# Patient Record
Sex: Male | Born: 1975 | Race: White | Hispanic: No | Marital: Married | Smoking: Never smoker
Health system: Southern US, Community
[De-identification: ages and names within clinical notes are randomized; demographics above are authoritative.]

## PROBLEM LIST (undated history)

## (undated) DIAGNOSIS — F4310 Post-traumatic stress disorder, unspecified: Secondary | ICD-10-CM

## (undated) DIAGNOSIS — D649 Anemia, unspecified: Secondary | ICD-10-CM

## (undated) DIAGNOSIS — F250 Schizoaffective disorder, bipolar type: Secondary | ICD-10-CM

## (undated) DIAGNOSIS — Z8659 Personal history of other mental and behavioral disorders: Secondary | ICD-10-CM

## (undated) DIAGNOSIS — F515 Nightmare disorder: Secondary | ICD-10-CM

## (undated) DIAGNOSIS — X749XXA Intentional self-harm by unspecified firearm discharge, initial encounter: Secondary | ICD-10-CM

## (undated) DIAGNOSIS — M1990 Unspecified osteoarthritis, unspecified site: Secondary | ICD-10-CM

## (undated) DIAGNOSIS — X838XXA Intentional self-harm by other specified means, initial encounter: Secondary | ICD-10-CM

## (undated) DIAGNOSIS — F4312 Post-traumatic stress disorder, chronic: Secondary | ICD-10-CM

## (undated) DIAGNOSIS — E785 Hyperlipidemia, unspecified: Secondary | ICD-10-CM

## (undated) HISTORY — PX: SPINE SURGERY: SHX786

## (undated) HISTORY — DX: Anemia, unspecified: D64.9

## (undated) HISTORY — PX: INGUINAL HERNIA REPAIR: SUR1180

## (undated) HISTORY — DX: Unspecified osteoarthritis, unspecified site: M19.90

## (undated) HISTORY — PX: OTHER SURGICAL HISTORY: SHX169

## (undated) HISTORY — DX: Hyperlipidemia, unspecified: E78.5

---

## 2019-11-07 ENCOUNTER — Emergency Department (HOSPITAL_COMMUNITY)
Admission: EM | Admit: 2019-11-07 | Discharge: 2019-11-07 | Disposition: A | Discharge status: Home / Self Care | Payer: 59 | Attending: Emergency Medicine | Admitting: Emergency Medicine

## 2019-11-07 ENCOUNTER — Other Ambulatory Visit: Payer: Self-pay

## 2019-11-07 ENCOUNTER — Encounter (HOSPITAL_COMMUNITY): Payer: Self-pay | Admitting: Emergency Medicine

## 2019-11-07 DIAGNOSIS — R1031 Right lower quadrant pain: Secondary | ICD-10-CM

## 2019-11-07 DIAGNOSIS — R1032 Left lower quadrant pain: Secondary | ICD-10-CM | POA: Diagnosis present

## 2019-11-07 HISTORY — DX: Hyperlipidemia, unspecified: E78.5

## 2019-11-07 HISTORY — DX: Unspecified osteoarthritis, unspecified site: M19.90

## 2019-11-07 MED ORDER — IBUPROFEN 400 MG PO TABS
600.0000 mg | ORAL_TABLET | Freq: Once | ORAL | Status: AC
  Administered 2019-11-07: 600 mg via ORAL
  Filled 2019-11-07: qty 2

## 2019-11-07 NOTE — ED Triage Notes (Addendum)
Pt was recently diagnosed with a right side inguinal hernia by his PCP. Pt was scheduled for a CT and ultrasound on Monday, but the pt is here for pain control. Pt was given 10 mg of morphine in route to bring pain down from an 8 to a 5.

## 2019-11-07 NOTE — Discharge Instructions (Addendum)
It was our pleasure to provide your ER care today - we hope that you feel better.  Take ibuprofen or acetaminophen as need for pain.   Follow up with your doctor this week as planned.   If increased swelling/hernia to area, follow up with general surgeon in the next few weeks.   Return to ER if worse, persistent painful bulge (incarcerated hernia) to area, fevers, abdominal pain, persistent vomiting, or other concern.

## 2019-11-07 NOTE — ED Provider Notes (Signed)
[facility] EMERGENCY DEPARTMENT Provider Note   CSN: [id number] Arrival date & time: 11/07/19  1244     History Chief Complaint  Patient presents with  . Inguinal Hernia    Evan Wilson is a 44 y.o. male.  Patient indicates in past couple weeks he's had an intermittently pain to right groin area, that feels as if something is being stretched. Pain localized to an area the size of a quarter, non radiating, dull, moderate, worse w certain movements (I.e. bearing down, certain movements/postions of right lower ext). No swelling to leg. No abd pain or nausea/vomiting. No fever or chills. No swelling or redness to area. No scrotal or testicular pain. No gu c/o.   The history is provided by the patient.       Past Medical History:  Diagnosis Date  . Arthritis    spine  . Hyperlipidemia     There are no problems to display for this patient.   Past Surgical History:  Procedure Laterality Date  . broken fingers    . Broken thumb         History reviewed. No pertinent family history.  Social History   Tobacco Use  . Smoking status: Never Smoker  . Smokeless tobacco: Never Used  Vaping Use  . Vaping Use: Never used  Substance Use Topics  . Alcohol use: Yes    Alcohol/week: 10.0 standard drinks    Types: 10 Cans of beer per week  . Drug use: Never    Home Medications Prior to Admission medications   Not on File    Allergies    Patient has no allergy information on record.  Review of Systems   Review of Systems  Constitutional: Negative for chills and fever.  Gastrointestinal: Negative for abdominal pain, nausea and vomiting.  Genitourinary: Negative for dysuria and hematuria.  Musculoskeletal: Negative for back pain.  Skin: Negative for rash and wound.    Physical Exam Updated Vital Signs BP 104/67 (BP Location: Right Arm)   Pulse 72   Temp 98 F (36.7 C) (Oral)   Resp 18   Ht 1.803 m (5\' 11" )   Wt 111.1 kg   SpO2 93%   BMI 34.17 kg/m    Physical Exam Vitals and nursing note reviewed.  Constitutional:      Appearance: Normal appearance. He is well-developed.  HENT:     Head: Atraumatic.     Nose: Nose normal.     Mouth/Throat:     Mouth: Mucous membranes are moist.  Eyes:     General: No scleral icterus.    Conjunctiva/sclera: Conjunctivae normal.  Neck:     Trachea: No tracheal deviation.  Cardiovascular:     Rate and Rhythm: Normal rate.     Pulses: Normal pulses.  Pulmonary:     Effort: Pulmonary effort is normal. No accessory muscle usage or respiratory distress.  Abdominal:     General: Bowel sounds are normal. There is no distension.     Palpations: Abdomen is soft. There is no mass.     Tenderness: There is no abdominal tenderness. There is no guarding.     Comments: Mild tenderness at right inguinal ring area. No incarcerated hernia. No mass. No l/a. No skin changes or soft tissue swelling.   Genitourinary:    Comments: No cva tenderness. No scrotal or testicular pain, swelling, or tenderness. Normal external gu exam.  Musculoskeletal:        General: No swelling.     Cervical  back: Neck supple.     Right lower leg: No edema.     Left lower leg: No edema.     Comments: RLE, femoral and distal pulses palp.   Skin:    General: Skin is warm and dry.     Findings: No rash.  Neurological:     Mental Status: He is alert.     Comments: Alert, speech clear.   Psychiatric:        Mood and Affect: Mood normal.     ED Results / Procedures / Treatments   Labs (all labs ordered are listed, but only abnormal results are displayed) Labs Reviewed - No data to display  EKG None  Radiology No results found.  Procedures Procedures (including critical care time)  Medications Ordered in ED Medications  ibuprofen (ADVIL) tablet 600 mg (has no administration in time range)    ED Course  I have reviewed the triage vital signs and the nursing notes.  Pertinent labs & imaging results that were  available during my care of the patient were reviewed by me and considered in my medical decision making (see chart for details).    MDM Rules/Calculators/A&P                          Ibuprofen po.  Reviewed nursing notes and prior charts for additional history.   ?possible right inguinal ring strain/pain.  Ptx exam is otherwise unremarkable. Pt does indicate that he has outpatient imaging and further evaluation already arranged by pcp.  Return precautions provided.     Final Clinical Impression(s) / ED Diagnoses Final diagnoses:  None    Rx / DC Orders ED Discharge Orders    None       Cathren Laine, MD 11/07/19 1313

## 2019-11-10 ENCOUNTER — Ambulatory Visit (INDEPENDENT_AMBULATORY_CARE_PROVIDER_SITE_OTHER): Payer: 59 | Admitting: General Surgery

## 2019-11-10 ENCOUNTER — Encounter: Payer: Self-pay | Admitting: General Surgery

## 2019-11-10 ENCOUNTER — Other Ambulatory Visit: Payer: Self-pay

## 2019-11-10 ENCOUNTER — Other Ambulatory Visit: Payer: Self-pay | Admitting: Family Medicine

## 2019-11-10 VITALS — BP 144/95 | HR 90 | Temp 98.0°F | Resp 16 | Ht 71.0 in | Wt 244.0 lb

## 2019-11-10 DIAGNOSIS — K4090 Unilateral inguinal hernia, without obstruction or gangrene, not specified as recurrent: Secondary | ICD-10-CM

## 2019-11-10 NOTE — Patient Instructions (Signed)

## 2019-11-10 NOTE — H&P (Signed)
Evan Wilson; [id number]; 1975/05/16   HPI Patient is a 44 year old white male who was referred to my care by Dr. Salvadore Farber for evaluation and treatment of a right inguinal hernia.  He states he started having right groin pain several weeks ago.  He thinks he was lifting something heavy.  Since that time, he has had ongoing intermittent right groin pain which radiates to his right inner thigh.  He was seen in the emergency room at [facility] and was thought to have a right inguinal hernia.  He had an outpatient ultrasound done which confirmed a right inguinal hernia.  He currently has 3 out of 10 pain with the hernia.  It is made worse with straining.   Past Medical History:  Diagnosis Date  . Arthritis    spine  . Hyperlipidemia     Past Surgical History:  Procedure Laterality Date  . broken fingers    . Broken thumb      History reviewed. No pertinent family history.  Current Outpatient Medications on File Prior to Visit  Medication Sig Dispense Refill  . atorvastatin (LIPITOR) 20 MG tablet Take 1 tablet by mouth daily.    Marland Kitchen tiZANidine (ZANAFLEX) 4 MG tablet Take 1 tablet by mouth every 8 (eight) hours as needed.    . traMADol (ULTRAM) 50 MG tablet Take by mouth.     No current facility-administered medications on file prior to visit.    Allergies  Allergen Reactions  . Ketorolac Tromethamine Swelling    Causes leg swelling    Social History   Substance and Sexual Activity  Alcohol Use Yes  . Alcohol/week: 10.0 standard drinks  . Types: 10 Cans of beer per week    Social History   Tobacco Use  Smoking Status Never Smoker  Smokeless Tobacco Never Used    Review of Systems  Constitutional: Negative.   HENT: Negative.   Eyes: Negative.   Respiratory: Negative.   Cardiovascular: Negative.   Gastrointestinal: Negative.   Genitourinary: Negative.   Musculoskeletal: Negative.   Skin: Negative.   Neurological: Negative.   Endo/Heme/Allergies: Negative.    Psychiatric/Behavioral: Negative.     Objective   Vitals:   11/10/19 1250  BP: (!) 144/95  Pulse: 90  Resp: 16  Temp: 98 F (36.7 C)  SpO2: 98%    Physical Exam Vitals reviewed.  Constitutional:      Appearance: Normal appearance. He is not ill-appearing.  HENT:     Head: Normocephalic and atraumatic.  Cardiovascular:     Rate and Rhythm: Normal rate and regular rhythm.     Heart sounds: Normal heart sounds. No murmur heard.  No friction rub. No gallop.   Pulmonary:     Effort: Pulmonary effort is normal. No respiratory distress.     Breath sounds: Normal breath sounds. No stridor. No wheezing, rhonchi or rales.  Abdominal:     General: Bowel sounds are normal. There is no distension.     Palpations: Abdomen is soft. There is no mass.     Tenderness: There is no abdominal tenderness. There is no guarding or rebound.     Hernia: A hernia is present.     Comments: Reducible right inguinal hernia.  Genitourinary:    Comments: Genitourinary examination is within normal limits. Skin:    General: Skin is warm and dry.  Neurological:     Mental Status: He is alert and oriented to person, place, and time.    Primary care and ER notes reviewed  Assessment  Right inguinal hernia Plan   Patient is scheduled for right inguinal herniorrhaphy with mesh on 11/16/2019.  The risks and benefits of the procedure including bleeding, infection, mesh use, and the possibility of recurrence of the hernia were fully explained to the patient, who gave informed consent. 

## 2019-11-10 NOTE — Progress Notes (Signed)
Evan Wilson; [id number]; 1975/05/16   HPI Patient is a 44 year old white male who was referred to my care by Dr. Salvadore Farber for evaluation and treatment of a right inguinal hernia.  He states he started having right groin pain several weeks ago.  He thinks he was lifting something heavy.  Since that time, he has had ongoing intermittent right groin pain which radiates to his right inner thigh.  He was seen in the emergency room at [facility] and was thought to have a right inguinal hernia.  He had an outpatient ultrasound done which confirmed a right inguinal hernia.  He currently has 3 out of 10 pain with the hernia.  It is made worse with straining.   Past Medical History:  Diagnosis Date  . Arthritis    spine  . Hyperlipidemia     Past Surgical History:  Procedure Laterality Date  . broken fingers    . Broken thumb      History reviewed. No pertinent family history.  Current Outpatient Medications on File Prior to Visit  Medication Sig Dispense Refill  . atorvastatin (LIPITOR) 20 MG tablet Take 1 tablet by mouth daily.    Marland Kitchen tiZANidine (ZANAFLEX) 4 MG tablet Take 1 tablet by mouth every 8 (eight) hours as needed.    . traMADol (ULTRAM) 50 MG tablet Take by mouth.     No current facility-administered medications on file prior to visit.    Allergies  Allergen Reactions  . Ketorolac Tromethamine Swelling    Causes leg swelling    Social History   Substance and Sexual Activity  Alcohol Use Yes  . Alcohol/week: 10.0 standard drinks  . Types: 10 Cans of beer per week    Social History   Tobacco Use  Smoking Status Never Smoker  Smokeless Tobacco Never Used    Review of Systems  Constitutional: Negative.   HENT: Negative.   Eyes: Negative.   Respiratory: Negative.   Cardiovascular: Negative.   Gastrointestinal: Negative.   Genitourinary: Negative.   Musculoskeletal: Negative.   Skin: Negative.   Neurological: Negative.   Endo/Heme/Allergies: Negative.    Psychiatric/Behavioral: Negative.     Objective   Vitals:   11/10/19 1250  BP: (!) 144/95  Pulse: 90  Resp: 16  Temp: 98 F (36.7 C)  SpO2: 98%    Physical Exam Vitals reviewed.  Constitutional:      Appearance: Normal appearance. He is not ill-appearing.  HENT:     Head: Normocephalic and atraumatic.  Cardiovascular:     Rate and Rhythm: Normal rate and regular rhythm.     Heart sounds: Normal heart sounds. No murmur heard.  No friction rub. No gallop.   Pulmonary:     Effort: Pulmonary effort is normal. No respiratory distress.     Breath sounds: Normal breath sounds. No stridor. No wheezing, rhonchi or rales.  Abdominal:     General: Bowel sounds are normal. There is no distension.     Palpations: Abdomen is soft. There is no mass.     Tenderness: There is no abdominal tenderness. There is no guarding or rebound.     Hernia: A hernia is present.     Comments: Reducible right inguinal hernia.  Genitourinary:    Comments: Genitourinary examination is within normal limits. Skin:    General: Skin is warm and dry.  Neurological:     Mental Status: He is alert and oriented to person, place, and time.    Primary care and ER notes reviewed  Assessment  Right inguinal hernia Plan   Patient is scheduled for right inguinal herniorrhaphy with mesh on 11/16/2019.  The risks and benefits of the procedure including bleeding, infection, mesh use, and the possibility of recurrence of the hernia were fully explained to the patient, who gave informed consent.

## 2019-11-11 NOTE — Patient Instructions (Signed)
Evan Wilson  11/11/2019     @PREFPERIOPPHARMACY @   Your procedure is scheduled on Monday, 11/16/19.  Report to [facility] at 0630 A.M.  Call this number if you have problems the morning of surgery:  [phone]   Remember:  Do not eat or drink after midnight.      Take these medicines the morning of surgery with A SIP OF WATER zanaflex or ultram if needed    Do not wear jewelry, make-up or nail polish.  Do not wear lotions, powders, or perfumes, or deodorant.  Do not shave 48 hours prior to surgery.  Men may shave face and neck.  Do not bring valuables to the hospital.  [facility] any belongings or valuables.  Contacts, dentures or bridgework may not be worn into surgery.  Leave your suitcase in the car.  After surgery it may be brought to your room.  For patients admitted to the hospital, discharge time will be determined by your treatment team.  Patients discharged the day of surgery will not be allowed to drive home.   Name and phone number of your driver:   driver Special instructions:  none  Please read over the following fact sheets that you were given. Coughing and Deep Breathing, Surgical Site Infection Prevention, Anesthesia Post-op Instructions and Care and Recovery After Surgery      Inguinal Hernia, Adult An inguinal hernia is when fat or your intestines push through a weak spot in a muscle where your leg meets your lower belly (groin). This causes a rounded lump (bulge). This kind of hernia could also be:  In your scrotum, if you are male.  In folds of skin around your vagina, if you are male. There are three types of inguinal hernias. These include:  Hernias that can be pushed back into the belly (are reducible). This type rarely causes pain.  Hernias that cannot be pushed back into the belly (are incarcerated).  Hernias that cannot be pushed back into the belly and lose their blood supply (are strangulated). This type needs  emergency surgery. If you do not have symptoms, you may not need treatment. If you have symptoms or a large hernia, you may need surgery. Follow these instructions at home: Lifestyle  Do these things if told by your doctor so you do not have trouble pooping (constipation): ? Drink enough fluid to keep your pee (urine) pale yellow. ? Eat foods that have a lot of fiber. These include fresh fruits and vegetables, whole grains, and beans. ? Limit foods that are high in fat and processed sugars. These include foods that are fried or sweet. ? Take medicine for trouble pooping.  Avoid lifting heavy objects.  Avoid standing for long amounts of time.  Do not use any products that contain nicotine or tobacco. These include cigarettes and e-cigarettes. If you need help quitting, ask your doctor.  Stay at a healthy weight. General instructions  You may try to push your hernia in by very gently pressing on it when you are lying down. Do not try to force the bulge back in if it will not push in easily.  Watch your hernia for any changes in shape, size, or color. Tell your doctor if you see any changes.  Take over-the-counter and prescription medicines only as told by your doctor.  Keep all follow-up visits as told by your doctor. This is important. Contact a doctor if:  You have a fever.  You have  new symptoms.  Your symptoms get worse. Get help right away if:  The area where your leg meets your lower belly has: ? Pain that gets worse suddenly. ? A bulge that gets bigger suddenly, and it does not get smaller after that. ? A bulge that turns red or purple. ? A bulge that is painful when you touch it.  You are a man, and your scrotum: ? Suddenly feels painful. ? Suddenly changes in size.  You cannot push the hernia in by very gently pressing on it when you are lying down. Do not try to force the bulge back in if it will not push in easily.  You feel sick to your stomach (nauseous),  and that feeling does not go away.  You throw up (vomit), and that keeps happening.  You have a fast heartbeat.  You cannot poop (have a bowel movement) or pass gas. These symptoms may be an emergency. Do not wait to see if the symptoms will go away. Get medical help right away. Call your local emergency services (911 in the [place] Summary  An inguinal hernia is when fat or your intestines push through a weak spot in a muscle where your leg meets your lower belly (groin). This causes a rounded lump (bulge).  If you do not have symptoms, you may not need treatment. If you have symptoms or a large hernia, you may need surgery.  Avoid lifting heavy objects. Also avoid standing for long amounts of time.  Do not try to force the bulge back in if it will not push in easily. This information is not intended to replace advice given to you by your health care provider. Make sure you discuss any questions you have with your health care provider. Document Revised: 03/16/2017 Document Reviewed: 11/14/2016 Elsevier Patient Education  2020 [employer] General Anesthesia, Adult General anesthesia is the use of medicines to make a person "go to sleep" (unconscious) for a medical procedure. General anesthesia must be used for certain procedures, and is often recommended for procedures that:  Last a long time.  Require you to be still or in an unusual position.  Are major and can cause blood loss. The medicines used for general anesthesia are called general anesthetics. As well as making you unconscious for a certain amount of time, these medicines:  Prevent pain.  Control your blood pressure.  Relax your muscles. Tell a health care provider about:  Any allergies you have.  All medicines you are taking, including vitamins, herbs, eye drops, creams, and over-the-counter medicines.  Any problems you or family members have had with anesthetic medicines.  Types of anesthetics you have had in  the past.  Any blood disorders you have.  Any surgeries you have had.  Any medical conditions you have.  Any recent upper respiratory, chest, or ear infections.  Any history of: ? Heart or lung conditions, such as heart failure, sleep apnea, asthma, or chronic obstructive pulmonary disease (COPD). ? Financial planner. ? Depression or anxiety.  Any tobacco or drug use, including marijuana or alcohol use.  Whether you are pregnant or may be pregnant. What are the risks? Generally, this is a safe procedure. However, problems may occur, including:  Allergic reaction.  Lung and heart problems.  Inhaling food or liquid from the stomach into the lungs (aspiration).  Nerve injury.  Dental injury.  Air in the bloodstream, which can lead to stroke.  Extreme agitation or confusion (delirium) when you wake up from the  anesthetic.  Waking up during your procedure and being unable to move. This is rare. These problems are more likely to develop if you are having a major surgery or if you have an advanced or serious medical condition. You can prevent some of these complications by answering all of your health care provider's questions thoroughly and by following all instructions before your procedure. General anesthesia can cause side effects, including:  Nausea or vomiting.  A sore throat from the breathing tube.  Hoarseness.  Wheezing or coughing.  Shaking chills.  Tiredness.  Body aches.  Anxiety.  Sleepiness or drowsiness.  Confusion or agitation. What happens before the procedure? Staying hydrated Follow instructions from your health care provider about hydration, which may include:  Up to 2 hours before the procedure - you may continue to drink clear liquids, such as water, clear fruit juice, black coffee, and plain tea.  Eating and drinking restrictions Follow instructions from your health care provider about eating and drinking, which may include:  8 hours  before the procedure - stop eating heavy meals or foods such as meat, fried foods, or fatty foods.  6 hours before the procedure - stop eating light meals or foods, such as toast or cereal.  6 hours before the procedure - stop drinking milk or drinks that contain milk.  2 hours before the procedure - stop drinking clear liquids. Medicines Ask your health care provider about:  Changing or stopping your regular medicines. This is especially important if you are taking diabetes medicines or blood thinners.  Taking medicines such as aspirin and ibuprofen. These medicines can thin your blood. Do not take these medicines unless your health care provider tells you to take them.  Taking over-the-counter medicines, vitamins, herbs, and supplements. Do not take these during the week before your procedure unless your health care provider approves them. General instructions  Starting 3-6 weeks before the procedure, do not use any products that contain nicotine or tobacco, such as cigarettes and e-cigarettes. If you need help quitting, ask your health care provider.  If you brush your teeth on the morning of the procedure, make sure to spit out all of the toothpaste.  Tell your health care provider if you become ill or develop a cold, cough, or fever.  If instructed by your health care provider, bring your sleep apnea device with you on the day of your surgery (if applicable).  Ask your health care provider if you will be going home the same day, the following day, or after a longer hospital stay. ? Plan to have someone take you home from the hospital or clinic. ? Plan to have a responsible adult care for you for at least 24 hours after you leave the hospital or clinic. This is important. What happens during the procedure?   You will be given anesthetics through both of the following: ? A mask placed over your nose and mouth. ? An IV in one of your veins.  You may receive a medicine to help you  relax (sedative).  After you are unconscious, a breathing tube may be inserted down your throat to help you breathe. This will be removed before you wake up.  An anesthesia specialist will stay with you throughout your procedure. He or she will: ? Keep you comfortable and safe by continuing to give you medicines and adjusting the amount of medicine that you get. ? Monitor your blood pressure, pulse, and oxygen levels to make sure that the anesthetics do  not cause any problems. The procedure may vary among health care providers and hospitals. What happens after the procedure?  Your blood pressure, temperature, heart rate, breathing rate, and blood oxygen level will be monitored until the medicines you were given have worn off.  You will wake up in a recovery area. You may wake up slowly.  If you feel anxious or agitated, you may be given medicine to help you calm down.  If you will be going home the same day, your health care provider may check to make sure you can walk, drink, and urinate.  Your health care provider will treat any pain or side effects you have before you go home.  Do not drive for 24 hours if you were given a sedative. Summary  General anesthesia is used to keep you still and prevent pain during a procedure.  It is important to tell your health care provider about your medical history and any surgeries you have had, and previous experience with anesthesia.  Follow your health care provider's instructions about when to stop eating, drinking, or taking certain medicines before your procedure.  Plan to have someone take you home from the hospital or clinic. This information is not intended to replace advice given to you by your health care provider. Make sure you discuss any questions you have with your health care provider. Document Revised: 07/02/2017 Document Reviewed: 09/28/2016 Elsevier Patient Education  2020 Else[employer] Inc. General Anesthesia, Adult, Care  After This sheet gives you information about how to care for yourself after your procedure. Your health care provider may also give you more specific instructions. If you have problems or questions, contact your health care provider. What can I expect after the procedure? After the procedure, the following side effects are common:  Pain or discomfort at the IV site.  Nausea.  Vomiting.  Sore throat.  Trouble concentrating.  Feeling cold or chills.  Weak or tired.  Sleepiness and fatigue.  Soreness and body aches. These side effects can affect parts of the body that were not involved in surgery. Follow these instructions at home:  For at least 24 hours after the procedure:  Have a responsible adult stay with you. It is important to have someone help care for you until you are awake and alert.  Rest as needed.  Do not: ? Participate in activities in which you could fall or become injured. ? Drive. ? Use heavy machinery. ? Drink alcohol. ? Take sleeping pills or medicines that cause drowsiness. ? Make important decisions or sign legal documents. ? Take care of children on your own. Eating and drinking  Follow any instructions from your health care provider about eating or drinking restrictions.  When you feel hungry, start by eating small amounts of foods that are soft and easy to digest (bland), such as toast. Gradually return to your regular diet.  Drink enough fluid to keep your urine pale yellow.  If you vomit, rehydrate by drinking water, juice, or clear broth. General instructions  If you have sleep apnea, surgery and certain medicines can increase your risk for breathing problems. Follow instructions from your health care provider about wearing your sleep device: ? Anytime you are sleeping, including during daytime naps. ? While taking prescription pain medicines, sleeping medicines, or medicines that make you drowsy.  Return to your normal activities as told  by your health care provider. Ask your health care provider what activities are safe for you.  Take over-the-counter and prescription medicines  only as told by your health care provider.  If you smoke, do not smoke without supervision.  Keep all follow-up visits as told by your health care provider. This is important. Contact a health care provider if:  You have nausea or vomiting that does not get better with medicine.  You cannot eat or drink without vomiting.  You have pain that does not get better with medicine.  You are unable to pass urine.  You develop a skin rash.  You have a fever.  You have redness around your IV site that gets worse. Get help right away if:  You have difficulty breathing.  You have chest pain.  You have blood in your urine or stool, or you vomit blood. Summary  After the procedure, it is common to have a sore throat or nausea. It is also common to feel tired.  Have a responsible adult stay with you for the first 24 hours after general anesthesia. It is important to have someone help care for you until you are awake and alert.  When you feel hungry, start by eating small amounts of foods that are soft and easy to digest (bland), such as toast. Gradually return to your regular diet.  Drink enough fluid to keep your urine pale yellow.  Return to your normal activities as told by your health care provider. Ask your health care provider what activities are safe for you. This information is not intended to replace advice given to you by your health care provider. Make sure you discuss any questions you have with your health care provider. Document Revised: 02/15/2017 Document Reviewed: 09/28/2016 Elsevier Patient Education  2020 [employer].

## 2019-11-11 NOTE — Anesthesia Preprocedure Evaluation (Addendum)
Anesthesia Evaluation  Patient identified by MRN, date of birth, ID band Patient awake    Reviewed: Allergy & Precautions, NPO status , Patient's Chart, lab work & pertinent test results  History of Anesthesia Complications Negative for: history of anesthetic complications  Airway Mallampati: II  TM Distance: >3 FB Neck ROM: Full    Dental  (+) Dental Advisory Given, Teeth Intact   Pulmonary neg pulmonary ROS,    Pulmonary exam normal breath sounds clear to auscultation       Cardiovascular negative cardio ROS Normal cardiovascular exam Rhythm:Regular Rate:Normal     Neuro/Psych negative neurological ROS     GI/Hepatic negative GI ROS, Neg liver ROS,   Endo/Other  negative endocrine ROS  Renal/GU negative Renal ROS  negative genitourinary   Musculoskeletal  (+) Arthritis , Osteoarthritis,    Abdominal   Peds  Hematology negative hematology ROS (+)   Anesthesia Other Findings   Reproductive/Obstetrics                            Anesthesia Physical Anesthesia Plan  ASA: II  Anesthesia Plan: General   Post-op Pain Management:    Induction: Intravenous  PONV Risk Score and Plan: 3 and Ondansetron, Dexamethasone and Midazolam  Airway Management Planned: Oral ETT  Additional Equipment:   Intra-op Plan:   Post-operative Plan: Extubation in OR  Informed Consent: I have reviewed the patients History and Physical, chart, labs and discussed the procedure including the risks, benefits and alternatives for the proposed anesthesia with the patient or authorized representative who has indicated his/her understanding and acceptance.     Dental advisory given  Plan Discussed with: CRNA  Anesthesia Plan Comments:        Anesthesia Quick Evaluation

## 2019-11-12 ENCOUNTER — Other Ambulatory Visit (HOSPITAL_COMMUNITY)
Admission: RE | Admit: 2019-11-12 | Discharge: 2019-11-12 | Disposition: A | Discharge status: Home / Self Care | Payer: 59 | Source: Ambulatory Visit | Attending: General Surgery | Admitting: General Surgery

## 2019-11-12 ENCOUNTER — Encounter (HOSPITAL_COMMUNITY): Payer: Self-pay

## 2019-11-12 ENCOUNTER — Encounter (HOSPITAL_COMMUNITY)
Admission: RE | Admit: 2019-11-12 | Discharge: 2019-11-12 | Disposition: A | Discharge status: Home / Self Care | Payer: 59 | Source: Ambulatory Visit | Attending: General Surgery | Admitting: General Surgery

## 2019-11-12 ENCOUNTER — Other Ambulatory Visit: Payer: Self-pay

## 2019-11-12 DIAGNOSIS — Z01812 Encounter for preprocedural laboratory examination: Secondary | ICD-10-CM | POA: Insufficient documentation

## 2019-11-12 DIAGNOSIS — Z20822 Contact with and (suspected) exposure to covid-19: Secondary | ICD-10-CM | POA: Diagnosis not present

## 2019-11-12 LAB — HEPATIC FUNCTION PANEL
ALT: 110 U/L — ABNORMAL HIGH (ref 0–44)
AST: 88 U/L — ABNORMAL HIGH (ref 15–41)
Albumin: 4 g/dL (ref 3.5–5.0)
Alkaline Phosphatase: 65 U/L (ref 38–126)
Bilirubin, Direct: 0.1 mg/dL (ref 0.0–0.2)
Indirect Bilirubin: 0.7 mg/dL (ref 0.3–0.9)
Total Bilirubin: 0.8 mg/dL (ref 0.3–1.2)
Total Protein: 7.9 g/dL (ref 6.5–8.1)

## 2019-11-12 LAB — BASIC METABOLIC PANEL
Anion gap: 11 (ref 5–15)
BUN: 13 mg/dL (ref 6–20)
CO2: 24 mmol/L (ref 22–32)
Calcium: 9.1 mg/dL (ref 8.9–10.3)
Chloride: 104 mmol/L (ref 98–111)
Creatinine, Ser: 0.85 mg/dL (ref 0.61–1.24)
GFR calc Af Amer: >60 mL/min (ref >60)
GFR calc non Af Amer: >60 mL/min (ref >60)
Glucose, Bld: 92 mg/dL (ref 70–99)
Potassium: 4.4 mmol/L (ref 3.5–5.1)
Sodium: 139 mmol/L (ref 135–145)

## 2019-11-12 LAB — CBC
HCT: 48.9 % (ref 39.0–52.0)
Hemoglobin: 16.4 g/dL (ref 13.0–17.0)
MCH: 32.9 pg (ref 26.0–34.0)
MCHC: 33.5 g/dL (ref 30.0–36.0)
MCV: 98.2 fL (ref 80.0–100.0)
Platelets: 195 10*3/uL (ref 150–400)
RBC: 4.98 MIL/uL (ref 4.22–5.81)
RDW: 13.1 % (ref 11.5–15.5)
WBC: 9.5 10*3/uL (ref 4.0–10.5)
nRBC: 0 % (ref 0.0–0.2)

## 2019-11-13 LAB — SARS CORONAVIRUS 2 (TAT 6-24 HRS): SARS Coronavirus 2: NEGATIVE

## 2019-11-16 ENCOUNTER — Encounter (HOSPITAL_COMMUNITY)
Admission: RE | Disposition: A | Discharge status: Home / Self Care | Payer: Self-pay | Source: Home / Self Care | Attending: General Surgery

## 2019-11-16 ENCOUNTER — Ambulatory Visit (HOSPITAL_COMMUNITY)
Admission: RE | Admit: 2019-11-16 | Discharge: 2019-11-16 | Disposition: A | Discharge status: Home / Self Care | Payer: 59 | Attending: General Surgery | Admitting: General Surgery

## 2019-11-16 ENCOUNTER — Ambulatory Visit (HOSPITAL_COMMUNITY): Payer: 59 | Admitting: Anesthesiology

## 2019-11-16 DIAGNOSIS — Z79899 Other long term (current) drug therapy: Secondary | ICD-10-CM | POA: Diagnosis not present

## 2019-11-16 DIAGNOSIS — E785 Hyperlipidemia, unspecified: Secondary | ICD-10-CM | POA: Insufficient documentation

## 2019-11-16 DIAGNOSIS — K4090 Unilateral inguinal hernia, without obstruction or gangrene, not specified as recurrent: Secondary | ICD-10-CM

## 2019-11-16 HISTORY — PX: INGUINAL HERNIA REPAIR: SHX194

## 2019-11-16 SURGERY — REPAIR, HERNIA, INGUINAL, ADULT
Anesthesia: General | Site: Inguinal | Laterality: Right

## 2019-11-16 MED ORDER — MIDAZOLAM HCL 2 MG/2ML IJ SOLN
INTRAMUSCULAR | Status: AC
  Filled 2019-11-16: qty 2

## 2019-11-16 MED ORDER — MEPERIDINE HCL 50 MG/ML IJ SOLN: 6.2500 mg | INTRAMUSCULAR | Status: DC | PRN

## 2019-11-16 MED ORDER — ONDANSETRON HCL 4 MG/2ML IJ SOLN
INTRAMUSCULAR | Status: DC | PRN
  Administered 2019-11-16: 4 mg via INTRAVENOUS

## 2019-11-16 MED ORDER — LIDOCAINE HCL (CARDIAC) PF 100 MG/5ML IV SOSY
PREFILLED_SYRINGE | INTRAVENOUS | Status: DC | PRN
  Administered 2019-11-16: 100 mg via INTRAVENOUS

## 2019-11-16 MED ORDER — FENTANYL CITRATE (PF) 100 MCG/2ML IJ SOLN
INTRAMUSCULAR | Status: DC | PRN
Start: 2019-11-16 — End: 2019-11-16
  Administered 2019-11-16 (×2): 100 ug via INTRAVENOUS

## 2019-11-16 MED ORDER — OXYCODONE-ACETAMINOPHEN 10-325 MG PO TABS: 1.0000 | ORAL_TABLET | Freq: Four times a day (QID) | ORAL | 0 refills | Status: DC | PRN

## 2019-11-16 MED ORDER — LACTATED RINGERS IV SOLN: INTRAVENOUS | Status: DC | PRN

## 2019-11-16 MED ORDER — HYDROMORPHONE HCL 1 MG/ML IJ SOLN
0.2500 mg | INTRAMUSCULAR | Status: DC | PRN
  Administered 2019-11-16 (×2): 0.5 mg via INTRAVENOUS
  Filled 2019-11-16 (×2): qty 0.5

## 2019-11-16 MED ORDER — FENTANYL CITRATE (PF) 100 MCG/2ML IJ SOLN
INTRAMUSCULAR | Status: AC
  Filled 2019-11-16: qty 2

## 2019-11-16 MED ORDER — CHLORHEXIDINE GLUCONATE CLOTH 2 % EX PADS: 6.0000 | MEDICATED_PAD | Freq: Once | CUTANEOUS | Status: DC

## 2019-11-16 MED ORDER — ONDANSETRON HCL 4 MG/2ML IJ SOLN
INTRAMUSCULAR | Status: AC
  Filled 2019-11-16: qty 2

## 2019-11-16 MED ORDER — PROMETHAZINE HCL 25 MG/ML IJ SOLN: 6.2500 mg | INTRAMUSCULAR | Status: DC | PRN

## 2019-11-16 MED ORDER — CHLORHEXIDINE GLUCONATE 0.12 % MT SOLN
15.0000 mL | Freq: Once | OROMUCOSAL | Status: AC
  Administered 2019-11-16: 15 mL via OROMUCOSAL

## 2019-11-16 MED ORDER — SUGAMMADEX SODIUM 500 MG/5ML IV SOLN
INTRAVENOUS | Status: DC | PRN
  Administered 2019-11-16: 400 mg via INTRAVENOUS

## 2019-11-16 MED ORDER — MIDAZOLAM HCL 5 MG/5ML IJ SOLN
INTRAMUSCULAR | Status: DC | PRN
  Administered 2019-11-16: 2 mg via INTRAVENOUS

## 2019-11-16 MED ORDER — ROCURONIUM BROMIDE 100 MG/10ML IV SOLN
INTRAVENOUS | Status: DC | PRN
  Administered 2019-11-16: 50 mg via INTRAVENOUS

## 2019-11-16 MED ORDER — BUPIVACAINE LIPOSOME 1.3 % IJ SUSP
INTRAMUSCULAR | Status: AC
  Filled 2019-11-16: qty 20

## 2019-11-16 MED ORDER — ORAL CARE MOUTH RINSE: 15.0000 mL | Freq: Once | OROMUCOSAL | Status: AC

## 2019-11-16 MED ORDER — CEFAZOLIN SODIUM-DEXTROSE 2-4 GM/100ML-% IV SOLN
2.0000 g | INTRAVENOUS | Status: AC
  Administered 2019-11-16: 2 g via INTRAVENOUS
  Filled 2019-11-16: qty 100

## 2019-11-16 MED ORDER — DEXAMETHASONE SODIUM PHOSPHATE 10 MG/ML IJ SOLN
INTRAMUSCULAR | Status: AC
  Filled 2019-11-16: qty 1

## 2019-11-16 MED ORDER — SODIUM CHLORIDE 0.9 % IR SOLN
Status: DC | PRN
  Administered 2019-11-16: 1000 mL

## 2019-11-16 MED ORDER — PROPOFOL 10 MG/ML IV BOLUS
INTRAVENOUS | Status: AC
  Filled 2019-11-16: qty 20

## 2019-11-16 MED ORDER — LIDOCAINE 2% (20 MG/ML) 5 ML SYRINGE
INTRAMUSCULAR | Status: AC
  Filled 2019-11-16: qty 5

## 2019-11-16 MED ORDER — BUPIVACAINE LIPOSOME 1.3 % IJ SUSP
INTRAMUSCULAR | Status: DC | PRN
  Administered 2019-11-16: 20 mL

## 2019-11-16 MED ORDER — DEXAMETHASONE SODIUM PHOSPHATE 10 MG/ML IJ SOLN
INTRAMUSCULAR | Status: DC | PRN
  Administered 2019-11-16: 10 mg via INTRAVENOUS

## 2019-11-16 MED ORDER — LACTATED RINGERS IV SOLN: Freq: Once | INTRAVENOUS | Status: AC

## 2019-11-16 MED ORDER — PROPOFOL 10 MG/ML IV BOLUS
INTRAVENOUS | Status: DC | PRN
  Administered 2019-11-16: 300 mg via INTRAVENOUS

## 2019-11-16 SURGICAL SUPPLY — 38 items
ADH SKN CLS APL DERMABOND .7 (GAUZE/BANDAGES/DRESSINGS) ×1
CLOTH BEACON ORANGE TIMEOUT ST (SAFETY) ×2 IMPLANT
COVER LIGHT HANDLE STERIS (MISCELLANEOUS) ×4 IMPLANT
COVER WAND RF STERILE (DRAPES) ×2 IMPLANT
DERMABOND ADVANCED (GAUZE/BANDAGES/DRESSINGS) ×1
DERMABOND ADVANCED .7 DNX12 (GAUZE/BANDAGES/DRESSINGS) ×1 IMPLANT
DRAIN PENROSE 0.5X18 (DRAIN) ×2 IMPLANT
ELECT REM PT RETURN 9FT ADLT (ELECTROSURGICAL) ×2
ELECTRODE REM PT RTRN 9FT ADLT (ELECTROSURGICAL) ×1 IMPLANT
GAUZE SPONGE 4X4 12PLY STRL (GAUZE/BANDAGES/DRESSINGS) ×2 IMPLANT
GLOVE BIOGEL PI IND STRL 7.0 (GLOVE) ×3 IMPLANT
GLOVE BIOGEL PI INDICATOR 7.0 (GLOVE) ×3
GLOVE SURG SS PI 7.5 STRL IVOR (GLOVE) ×2 IMPLANT
GOWN STRL REUS W/TWL LRG LVL3 (GOWN DISPOSABLE) ×6 IMPLANT
INST SET MINOR GENERAL (KITS) ×2 IMPLANT
KIT TURNOVER KIT A (KITS) ×2 IMPLANT
MANIFOLD NEPTUNE II (INSTRUMENTS) ×2 IMPLANT
MESH HERNIA 1.6X1.9 PLUG LRG (Mesh General) ×1 IMPLANT
MESH HERNIA PLUG LRG (Mesh General) ×1 IMPLANT
NEEDLE HYPO 18GX1.5 BLUNT FILL (NEEDLE) ×2 IMPLANT
NEEDLE HYPO 22GX1.5 SAFETY (NEEDLE) ×2 IMPLANT
NS IRRIG 1000ML POUR BTL (IV SOLUTION) ×2 IMPLANT
PACK MINOR (CUSTOM PROCEDURE TRAY) ×2 IMPLANT
PAD ARMBOARD 7.5X6 YLW CONV (MISCELLANEOUS) ×2 IMPLANT
PENCIL SMOKE EVACUATOR (MISCELLANEOUS) ×2 IMPLANT
SET BASIN LINEN APH (SET/KITS/TRAYS/PACK) ×2 IMPLANT
SOL PREP PROV IODINE SCRUB 4OZ (MISCELLANEOUS) ×2 IMPLANT
SUT MNCRL AB 4-0 PS2 18 (SUTURE) ×2 IMPLANT
SUT NOVA NAB GS-22 2 2-0 T-19 (SUTURE) ×6 IMPLANT
SUT SILK 3 0 (SUTURE)
SUT SILK 3-0 18XBRD TIE 12 (SUTURE) IMPLANT
SUT VIC AB 2-0 CT1 27 (SUTURE) ×2
SUT VIC AB 2-0 CT1 TAPERPNT 27 (SUTURE) ×1 IMPLANT
SUT VIC AB 3-0 SH 27 (SUTURE) ×2
SUT VIC AB 3-0 SH 27X BRD (SUTURE) ×1 IMPLANT
SUT VICRYL AB 2 0 TIES (SUTURE) IMPLANT
SUT VICRYL AB 3 0 TIES (SUTURE) IMPLANT
SYR 20ML LL LF (SYRINGE) ×4 IMPLANT

## 2019-11-16 NOTE — Anesthesia Postprocedure Evaluation (Signed)
Anesthesia Post Note  Patient: Evan Wilson  Procedure(s) Performed: HERNIA REPAIR INGUINAL ADULT (Right Inguinal)  Patient location during evaluation: PACU Anesthesia Type: General Level of consciousness: awake, oriented, awake and alert and patient cooperative Pain management: satisfactory to patient Vital Signs Assessment: post-procedure vital signs reviewed and stable Respiratory status: spontaneous breathing, respiratory function stable, nonlabored ventilation and patient connected to face mask oxygen Cardiovascular status: stable Postop Assessment: no apparent nausea or vomiting Anesthetic complications: no   No complications documented.   Last Vitals:  Vitals:   11/16/19 0730 11/16/19 0745  BP: (!) 129/92   Pulse: 71 72  Resp: (!) 21 17  Temp: 36.7 C   SpO2: 98% 98%    Last Pain:  Vitals:   11/16/19 0730  PainSc: 0-No pain                 Otis Portal

## 2019-11-16 NOTE — Transfer of Care (Signed)
Immediate Anesthesia Transfer of Care Note  Patient: Evan Wilson  Procedure(s) Performed: HERNIA REPAIR INGUINAL ADULT (Right Inguinal)  Patient Location: PACU  Anesthesia Type:General  Level of Consciousness: awake, alert , oriented and patient cooperative  Airway & Oxygen Therapy: Patient Spontanous Breathing and Patient connected to face mask oxygen  Post-op Assessment: Report given to RN, Post -op Vital signs reviewed and stable and Patient moving all extremities X 4  Post vital signs: Reviewed and stable  Last Vitals:  Vitals Value Taken Time  BP    Temp    Pulse 69 11/16/19 1010  Resp 10 11/16/19 1010  SpO2 95 % 11/16/19 1010  Vitals shown include unvalidated device data.  Last Pain:  Vitals:   11/16/19 0730  PainSc: 0-No pain         Complications: No complications documented.

## 2019-11-16 NOTE — Op Note (Signed)
Patient:  Evan Wilson  DOB:  [birth year]  MRN:  [id number]   Preop Diagnosis: Right inguinal hernia  Postop Diagnosis: Same  Procedure: Right inguinal herniorrhaphy with mesh  Surgeon: Franky Macho, MD  Anes: General  Indications: Patient is a 44 year old white male who presents with a symptomatic right inguinal hernia.  The risks and benefits of the procedure including bleeding, infection, mesh use, and the possibility of recurrence of the hernia were fully explained to the patient, who gave informed consent.  Procedure note: The patient was placed in the supine position.  After general anesthesia was administered, the right groin region was prepped and draped using the usual sterile technique with Betadine.  Surgical site confirmation was performed.  An incision was made in the right groin region down to the external oblique aponeurosis.  The aponeurosis was incised to the external ring.  A Penrose drain was placed around the spermatic cord.  The vas deferens was noted within the spermatic cord.  The ilioinguinal nerve was identified and kept from the operative field.  The patient was noted to have a large direct inguinal hernia.  This was incised at its base and a large Bard PerFix plug was then inserted.  It was secured circumferentially to the transversalis fascia using 2-0 Novafil interrupted sutures.  An onlay patch was placed along the floor of the inguinal canal and secured superiorly to the conjoined tendon and inferiorly to the shelving edge of Poupart's ligament using 2-0 Novafil interrupted sutures.  The internal ring was recreated using a 2-0 Novafil interrupted suture.  The external oblique aponeurosis was reapproximated using a 2-0 Vicryl running suture.  The subcutaneous layer was reapproximated using 3-0 Vicryl interrupted sutures.  Exparel was instilled into the surrounding wound.  The skin was closed using a 4-0 Monocryl subcuticular suture.  Dermabond was applied.  All tape  and needle counts were correct at the end of the procedure.  The patient was awakened and transferred to PACU in stable condition.  Complications: None  EBL: Minimal  Specimen: None

## 2019-11-16 NOTE — Discharge Instructions (Signed)
[facility] THE [place] [facility] UNTIL Friday November 20, 2019. DO NOT USE ANY ADDITIONAL NUMBING MEDICATIONS WITHOUT CONSULTING A PHYSICIAN      Open Hernia Repair, Adult, Care After This sheet gives you information about how to care for yourself after your procedure. Your health care provider may also give you more specific instructions. If you have problems or questions, contact your health care provider. What can I expect after the procedure? After the procedure, it is common to have:  Mild discomfort.  Slight bruising.  Minor swelling.  Pain in the abdomen. Follow these instructions at home: Incision care   Follow instructions from your health care provider about how to take care of your incision area. Make sure you: ? Wash your hands with soap and water before you change your bandage (dressing). If soap and water are not available, use hand sanitizer. ? Change your dressing as told by your health care provider. ? Leave stitches (sutures), skin glue, or adhesive strips in place. These skin closures may need to stay in place for 2 weeks or longer. If adhesive strip edges start to loosen and curl up, you may trim the loose edges. Do not remove adhesive strips completely unless your health care provider tells you to do that.  Check your incision area every day for signs of infection. Check for: ? More redness, swelling, or pain. ? More fluid or blood. ? Warmth. ? Pus or a bad smell. Activity  Do not drive or use heavy machinery while taking prescription pain medicine. Do not drive until your health care provider approves.  Until your health care provider approves: ? Do not lift anything that is heavier than 10 lb (4.5 kg). ? Do not play contact sports.  Return to your normal activities as told by your health care provider. Ask your health care provider what activities are safe. General instructions  To prevent or treat constipation while you are taking  prescription pain medicine, your health care provider may recommend that you: ? Drink enough fluid to keep your urine clear or pale yellow. ? Take over-the-counter or prescription medicines. ? Eat foods that are high in fiber, such as fresh fruits and vegetables, whole grains, and beans. ? Limit foods that are high in fat and processed sugars, such as fried and sweet foods.  Take over-the-counter and prescription medicines only as told by your health care provider.  Do not take tub baths or go swimming until your health care provider approves.  Keep all follow-up visits as told by your health care provider. This is important. Contact a health care provider if:  You develop a rash.  You have more redness, swelling, or pain around your incision.  You have more fluid or blood coming from your incision.  Your incision feels warm to the touch.  You have pus or a bad smell coming from your incision.  You have a fever or chills.  You have blood in your stool (feces).  You have not had a bowel movement in 2-3 days.  Your pain is not controlled with medicine. Get help right away if:  You have chest pain or shortness of breath.  You feel light-headed or feel faint.  You have severe pain.  You vomit and your pain is worse. This information is not intended to replace advice given to you by your health care provider. Make sure you discuss any questions you have with your health care provider. Document Revised: 01/25/2017 Document Reviewed: 07/27/2015 Elsevier Patient  Education  2020 [employer].     General Anesthesia, Adult, Care After This sheet gives you information about how to care for yourself after your procedure. Your health care provider may also give you more specific instructions. If you have problems or questions, contact your health care provider. What can I expect after the procedure? After the procedure, the following side effects are common:  Pain or discomfort  at the IV site.  Nausea.  Vomiting.  Sore throat.  Trouble concentrating.  Feeling cold or chills.  Weak or tired.  Sleepiness and fatigue.  Soreness and body aches. These side effects can affect parts of the body that were not involved in surgery. Follow these instructions at home:  For at least 24 hours after the procedure:  Have a responsible adult stay with you. It is important to have someone help care for you until you are awake and alert.  Rest as needed.  Do not: ? Participate in activities in which you could fall or become injured. ? Drive. ? Use heavy machinery. ? Drink alcohol. ? Take sleeping pills or medicines that cause drowsiness. ? Make important decisions or sign legal documents. ? Take care of children on your own. Eating and drinking  Follow any instructions from your health care provider about eating or drinking restrictions.  When you feel hungry, start by eating small amounts of foods that are soft and easy to digest (bland), such as toast. Gradually return to your regular diet.  Drink enough fluid to keep your urine pale yellow.  If you vomit, rehydrate by drinking water, juice, or clear broth. General instructions  If you have sleep apnea, surgery and certain medicines can increase your risk for breathing problems. Follow instructions from your health care provider about wearing your sleep device: ? Anytime you are sleeping, including during daytime naps. ? While taking prescription pain medicines, sleeping medicines, or medicines that make you drowsy.  Return to your normal activities as told by your health care provider. Ask your health care provider what activities are safe for you.  Take over-the-counter and prescription medicines only as told by your health care provider.  If you smoke, do not smoke without supervision.  Keep all follow-up visits as told by your health care provider. This is important. Contact a health care provider  if:  You have nausea or vomiting that does not get better with medicine.  You cannot eat or drink without vomiting.  You have pain that does not get better with medicine.  You are unable to pass urine.  You develop a skin rash.  You have a fever.  You have redness around your IV site that gets worse. Get help right away if:  You have difficulty breathing.  You have chest pain.  You have blood in your urine or stool, or you vomit blood. Summary  After the procedure, it is common to have a sore throat or nausea. It is also common to feel tired.  Have a responsible adult stay with you for the first 24 hours after general anesthesia. It is important to have someone help care for you until you are awake and alert.  When you feel hungry, start by eating small amounts of foods that are soft and easy to digest (bland), such as toast. Gradually return to your regular diet.  Drink enough fluid to keep your urine pale yellow.  Return to your normal activities as told by your health care provider. Ask your health care provider  what activities are safe for you. This information is not intended to replace advice given to you by your health care provider. Make sure you discuss any questions you have with your health care provider. Document Revised: 02/15/2017 Document Reviewed: 09/28/2016 Elsevier Patient Education  [place].

## 2019-11-16 NOTE — Anesthesia Procedure Notes (Signed)
Procedure Name: Intubation Date/Time: 11/16/2019 9:11 AM Performed by: Jonna Munro, CRNA Pre-anesthesia Checklist: Patient identified, Emergency Drugs available, Suction available, Patient being monitored and Timeout performed Patient Re-evaluated:Patient Re-evaluated prior to induction Oxygen Delivery Method: Circle system utilized Preoxygenation: Pre-oxygenation with 100% oxygen Induction Type: IV induction Ventilation: Oral airway inserted - appropriate to patient size Laryngoscope Size: Mac and 4 Grade View: Grade I Tube type: Oral Tube size: 7.5 mm Number of attempts: 1 Airway Equipment and Method: Stylet Placement Confirmation: ETT inserted through vocal cords under direct vision,  positive ETCO2 and breath sounds checked- equal and bilateral Secured at: 22 cm Tube secured with: Tape Dental Injury: Teeth and Oropharynx as per pre-operative assessment

## 2019-11-16 NOTE — Interval H&P Note (Signed)
History and Physical Interval Note:  11/16/2019 7:19 AM  Evan Wilson  has presented today for surgery, with the diagnosis of Right Inguinal hernia.  The various methods of treatment have been discussed with the patient and family. After consideration of risks, benefits and other options for treatment, the patient has consented to  Procedure(s): HERNIA REPAIR INGUINAL ADULT (Right) as a surgical intervention.  The patient's history has been reviewed, patient examined, no change in status, stable for surgery.  I have reviewed the patient's chart and labs.  Questions were answered to the patient's satisfaction.     Franky Macho

## 2019-11-17 ENCOUNTER — Encounter (HOSPITAL_COMMUNITY): Payer: Self-pay | Admitting: General Surgery

## 2019-11-26 ENCOUNTER — Telehealth (INDEPENDENT_AMBULATORY_CARE_PROVIDER_SITE_OTHER): Payer: Self-pay | Admitting: General Surgery

## 2019-11-26 ENCOUNTER — Other Ambulatory Visit: Payer: Self-pay

## 2019-11-26 ENCOUNTER — Encounter: Payer: Self-pay | Admitting: General Surgery

## 2019-11-26 DIAGNOSIS — Z09 Encounter for follow-up examination after completed treatment for conditions other than malignant neoplasm: Secondary | ICD-10-CM

## 2019-11-26 NOTE — Progress Notes (Signed)
Subjective:     Evan Wilson  Virtual telephone visit performed.  Patient states he is doing well.  He is resuming his normal activity gradually.  He has minimal groin pain. Objective:    There were no vitals taken for this visit.  General:  alert, cooperative and no distress       Assessment:    Doing well postoperatively.    Plan:   Patient should continue his gradual increase in activity.  He will call the office to get a return to work note.  Follow-up here as needed.

## 2019-11-27 ENCOUNTER — Other Ambulatory Visit (INDEPENDENT_AMBULATORY_CARE_PROVIDER_SITE_OTHER): Payer: 59 | Admitting: General Surgery

## 2019-11-27 DIAGNOSIS — Z09 Encounter for follow-up examination after completed treatment for conditions other than malignant neoplasm: Secondary | ICD-10-CM

## 2019-11-27 MED ORDER — OXYCODONE-ACETAMINOPHEN 10-325 MG PO TABS
1.0000 | ORAL_TABLET | Freq: Four times a day (QID) | ORAL | 0 refills | Status: AC | PRN
Start: 2019-11-27 — End: 2020-11-26

## 2019-11-27 NOTE — Progress Notes (Signed)
Reordered percocet for pain. 

## 2020-02-16 ENCOUNTER — Ambulatory Visit: Payer: Self-pay | Admitting: General Surgery

## 2020-05-23 ENCOUNTER — Encounter (HOSPITAL_COMMUNITY): Payer: Self-pay | Admitting: General Surgery

## 2020-08-07 ENCOUNTER — Other Ambulatory Visit: Payer: Self-pay

## 2020-08-07 ENCOUNTER — Emergency Department (HOSPITAL_BASED_OUTPATIENT_CLINIC_OR_DEPARTMENT_OTHER)
Admission: EM | Admit: 2020-08-07 | Discharge: 2020-08-07 | Disposition: A | Discharge status: Home / Self Care | Payer: Medicaid Other | Attending: Emergency Medicine | Admitting: Emergency Medicine

## 2020-08-07 ENCOUNTER — Emergency Department (HOSPITAL_BASED_OUTPATIENT_CLINIC_OR_DEPARTMENT_OTHER): Payer: Medicaid Other

## 2020-08-07 DIAGNOSIS — S6991XA Unspecified injury of right wrist, hand and finger(s), initial encounter: Secondary | ICD-10-CM | POA: Diagnosis present

## 2020-08-07 DIAGNOSIS — S62363A Nondisplaced fracture of neck of third metacarpal bone, left hand, initial encounter for closed fracture: Secondary | ICD-10-CM | POA: Diagnosis not present

## 2020-08-07 DIAGNOSIS — S62353A Nondisplaced fracture of shaft of third metacarpal bone, left hand, initial encounter for closed fracture: Secondary | ICD-10-CM | POA: Insufficient documentation

## 2020-08-07 DIAGNOSIS — S60221A Contusion of right hand, initial encounter: Secondary | ICD-10-CM

## 2020-08-07 DIAGNOSIS — W228XXA Striking against or struck by other objects, initial encounter: Secondary | ICD-10-CM | POA: Diagnosis not present

## 2020-08-07 DIAGNOSIS — S62362A Nondisplaced fracture of neck of third metacarpal bone, right hand, initial encounter for closed fracture: Secondary | ICD-10-CM

## 2020-08-07 MED ORDER — TRAMADOL HCL 50 MG PO TABS
50.0000 mg | ORAL_TABLET | Freq: Once | ORAL | Status: AC
  Administered 2020-08-07: 19:00:00 50 mg via ORAL
  Filled 2020-08-07: qty 1

## 2020-08-07 MED ORDER — ACETAMINOPHEN 500 MG PO TABS
1000.0000 mg | ORAL_TABLET | Freq: Once | ORAL | Status: AC
  Administered 2020-08-07: 19:00:00 1000 mg via ORAL
  Filled 2020-08-07: qty 2

## 2020-08-07 MED ORDER — TRAMADOL HCL 50 MG PO TABS: 50.0000 mg | ORAL_TABLET | Freq: Four times a day (QID) | ORAL | 0 refills | Status: DC | PRN

## 2020-08-07 NOTE — Discharge Instructions (Signed)
It was our pleasure to provide your ER care today - we hope that you feel better.  Keep splint clean/dry. Elevate hand.   Follow up with orthopedist in the coming week - call office tomorrow AM to arrange appointment.   Take acetaminophen or ibuprofen as need for pain. You may also take ultram as need for pain - no driving when taking ultram.   Your blood pressure is mildly high tonight - follow up with primary care doctor in the next few weeks.   Return to ER if worse, new symptoms, severe or intractable pain, numbness, or other concern.

## 2020-08-07 NOTE — ED Notes (Signed)
Pt refused ice to the right hand

## 2020-08-07 NOTE — ED Triage Notes (Signed)
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 Patient has obvious swelling to the right hand.

## 2020-08-07 NOTE — ED Provider Notes (Signed)
[facility] EMERGENCY DEPT Provider Note   CSN: [id number] Arrival date & time: 08/07/20  1838     History Chief Complaint  Patient presents with   Hand Injury    Evan Wilson is a 45 y.o. male.  Patient c/o contusion/pain to right hand. Occurred just pta  today while working on lawnmower, wrench slipped and right hand ended up 'punching' [employer]or, mining. Contusion pain in region right 3rd MCP. Pain constant, dull, moderate, worse w palpation. No numbness. Skin is intact. Denies other pain or injury.   The history is provided by the patient.  Hand Injury Associated symptoms: no fever       Past Medical History:  Diagnosis Date   Arthritis    spine   Hyperlipidemia     Patient Active Problem List   Diagnosis Date Noted   Right inguinal hernia     Past Surgical History:  Procedure Laterality Date   broken fingers     Broken thumb     INGUINAL HERNIA REPAIR Right 11/16/2019   Procedure: HERNIA REPAIR INGUINAL ADULT;  Surgeon: Franky Macho, MD;  Location: [facility];  Service: General;  Laterality: Right;       No family history on file.  Social History   Tobacco Use   Smoking status: Never   Smokeless tobacco: Never  Vaping Use   Vaping Use: Never used  Substance Use Topics   Alcohol use: Yes    Alcohol/week: 10.0 standard drinks    Types: 10 Cans of beer per week   Drug use: Never    Home Medications Prior to Admission medications   Medication Sig Start Date End Date Taking? Authorizing Provider  atorvastatin (LIPITOR) 20 MG tablet Take 20 mg by mouth daily after supper.  10/20/19   [provider]  oxyCODONE-acetaminophen (PERCOCET) 10-325 MG tablet Take 1 tablet by mouth every 6 (six) hours as needed for pain. 11/27/19 11/26/20  Franky Macho, MD  tiZANidine (ZANAFLEX) 4 MG tablet Take 4 mg by mouth at bedtime.  10/27/19   [provider]    Allergies    Ketorolac tromethamine  Review of Systems   Review of Systems   Constitutional:  Negative for fever.  Musculoskeletal:        Contusion right hand.   Skin:  Negative for wound.  Neurological:  Negative for numbness.   Physical Exam Updated Vital Signs BP (!) 133/96   Pulse 87   Temp 98.2 F (36.8 C) (Oral)   Resp 18   SpO2 98%   Physical Exam Vitals and nursing note reviewed.  Constitutional:      Appearance: Normal appearance. He is well-developed.  HENT:     Head: Atraumatic.     Nose: Nose normal.     Mouth/Throat:     Mouth: Mucous membranes are moist.  Eyes:     General: No scleral icterus.    Conjunctiva/sclera: Conjunctivae normal.  Neck:     Trachea: No tracheal deviation.  Cardiovascular:     Rate and Rhythm: Normal rate.     Pulses: Normal pulses.  Pulmonary:     Effort: Pulmonary effort is normal. No accessory muscle usage or respiratory distress.  Genitourinary:    Comments: No cva tenderness. Musculoskeletal:     Cervical back: Neck supple.     Comments: Tenderness and mild swelling to distal right 3rd mc. Skin is intact. Intact tendon fxn. Normal cap refill distally.   Skin:    General: Skin is warm and  dry.     Findings: No rash.  Neurological:     Mental Status: He is alert.     Comments: Alert, speech clear. Right hand, motor/sens grossly intact.   Psychiatric:        Mood and Affect: Mood normal.    ED Results / Procedures / Treatments   Labs (all labs ordered are listed, but only abnormal results are displayed) Labs Reviewed - No data to display  EKG None  Radiology DG Hand Complete Right  Result Date: 08/07/2020 CLINICAL DATA:  Injured hand today. EXAM: RIGHT HAND - COMPLETE 3+ VIEW COMPARISON:  None. FINDINGS: There is a nondisplaced fracture involving the third metacarpal neck/distal shaft. No intra-articular involvement. The other metacarpal bones are intact. No wrist fractures are identified. Moderate dorsal soft tissue swelling but no radiopaque foreign body. IMPRESSION: Nondisplaced third  metacarpal neck/distal shaft fracture. Electronically Signed   By: Rudie Meyer M.D.   On: 08/07/2020 19:14    Procedures Procedures   Medications Ordered in ED Medications  acetaminophen (TYLENOL) tablet 1,000 mg (1,000 mg Oral Given 08/07/20 1917)  traMADol (ULTRAM) tablet 50 mg (50 mg Oral Given 08/07/20 1917)    ED Course  I have reviewed the triage vital signs and the nursing notes.  Pertinent labs & imaging results that were available during my care of the patient were reviewed by me and considered in my medical decision making (see chart for details).    MDM Rules/Calculators/A&P                         Xrays.   Reviewed nursing notes and prior charts for additional history.   Xrays reviewed/interpreted by me - +3rd mc fx.   No meds pta, pt has ride. Ultram, acetaminophen po.   Splint by tech, ulnar gutter. Recheck post splint, pain controlled. Normal cap refill distally.   Ortho f/u.   Final Clinical Impression(s) / ED Diagnoses Final diagnoses:  None    Rx / DC Orders ED Discharge Orders     None        Cathren Laine, MD 08/07/20 2007

## 2020-08-24 ENCOUNTER — Emergency Department (HOSPITAL_COMMUNITY)
Admission: EM | Admit: 2020-08-24 | Discharge: 2020-08-24 | Disposition: A | Discharge status: Home / Self Care | Payer: Medicaid Other | Attending: Emergency Medicine | Admitting: Emergency Medicine

## 2020-08-24 ENCOUNTER — Encounter (HOSPITAL_COMMUNITY): Payer: Self-pay | Admitting: Emergency Medicine

## 2020-08-24 ENCOUNTER — Other Ambulatory Visit: Payer: Self-pay

## 2020-08-24 ENCOUNTER — Emergency Department (HOSPITAL_COMMUNITY): Payer: Medicaid Other

## 2020-08-24 DIAGNOSIS — Z20822 Contact with and (suspected) exposure to covid-19: Secondary | ICD-10-CM | POA: Diagnosis not present

## 2020-08-24 DIAGNOSIS — J209 Acute bronchitis, unspecified: Secondary | ICD-10-CM

## 2020-08-24 DIAGNOSIS — R059 Cough, unspecified: Secondary | ICD-10-CM | POA: Insufficient documentation

## 2020-08-24 DIAGNOSIS — R0602 Shortness of breath: Secondary | ICD-10-CM | POA: Insufficient documentation

## 2020-08-24 LAB — SARS CORONAVIRUS 2 (TAT 6-24 HRS): SARS Coronavirus 2: NEGATIVE

## 2020-08-24 MED ORDER — PREDNISONE 10 MG PO TABS: 20.0000 mg | ORAL_TABLET | Freq: Two times a day (BID) | ORAL | 0 refills | Status: DC

## 2020-08-24 MED ORDER — AZITHROMYCIN 250 MG PO TABS: ORAL_TABLET | ORAL | 0 refills | Status: DC

## 2020-08-24 NOTE — ED Triage Notes (Signed)
[facility] - pt c/o increased SOB since last Thursday. Pt states that he is having SOB when laying down and on exertion.

## 2020-08-24 NOTE — Discharge Instructions (Addendum)
Begin taking Zithromax and prednisone as prescribed.  Continue over-the-counter medications as needed for relief of symptoms.  Follow-up with primary doctor if symptoms are not improving in the next few days.

## 2020-08-24 NOTE — ED Provider Notes (Signed)
[facility] EMERGENCY DEPARTMENT Provider Note   CSN: [id number] Arrival date & time: 08/24/20  0151     History Chief Complaint  Patient presents with  . Shortness of Breath    Evan Wilson is a 45 y.o. male.  Patient is a 45 year old male with history of hyperlipidemia.  He presents today for evaluation of cough and shortness of breath.  This has been occurring for the past 5 days.  It is worse when he ambulates and lies flat.  His cough has been intermittently productive of white sputum.  He denies fevers or chills.  He denies any chest pain or leg swelling.  The history is provided by the patient.  Shortness of Breath Severity:  Moderate Onset quality:  Gradual Duration:  5 days Timing:  Constant Progression:  Worsening Chronicity:  New Worsened by:  Nothing Ineffective treatments:  None tried     Past Medical History:  Diagnosis Date  . Arthritis    spine  . Hyperlipidemia     Patient Active Problem List   Diagnosis Date Noted  . Right inguinal hernia     Past Surgical History:  Procedure Laterality Date  . broken fingers    . Broken thumb    . INGUINAL HERNIA REPAIR Right 11/16/2019   Procedure: HERNIA REPAIR INGUINAL ADULT;  Surgeon: Franky Macho, MD;  Location: [facility];  Service: General;  Laterality: Right;       No family history on file.  Social History   Tobacco Use  . Smoking status: Never  . Smokeless tobacco: Never  Vaping Use  . Vaping Use: Never used  Substance Use Topics  . Alcohol use: Yes    Alcohol/week: 10.0 standard drinks    Types: 10 Cans of beer per week  . Drug use: Never    Home Medications Prior to Admission medications   Medication Sig Start Date End Date Taking? Authorizing Provider  atorvastatin (LIPITOR) 20 MG tablet Take 20 mg by mouth daily after supper.  10/20/19   [provider]  oxyCODONE-acetaminophen (PERCOCET) 10-325 MG tablet Take 1 tablet by mouth every 6 (six) hours as needed for pain. 11/27/19  11/26/20  Franky Macho, MD  tiZANidine (ZANAFLEX) 4 MG tablet Take 4 mg by mouth at bedtime.  10/27/19   [provider]  traMADol (ULTRAM) 50 MG tablet Take 1 tablet (50 mg total) by mouth every 6 (six) hours as needed. 08/07/20   Cathren Laine, MD    Allergies    Ketorolac tromethamine  Review of Systems   Review of Systems  Respiratory:  Positive for shortness of breath.   All other systems reviewed and are negative.  Physical Exam Updated Vital Signs BP 133/82 (BP Location: Right Arm)   Pulse 92   Temp 98 F (36.7 C) (Oral)   Resp 18   Ht 5\' 11"  (1.803 m)   Wt 113.4 kg   SpO2 97%   BMI 34.87 kg/m   Physical Exam Vitals and nursing note reviewed.  Constitutional:      General: He is not in acute distress.    Appearance: He is well-developed. He is not diaphoretic.  HENT:     Head: Normocephalic and atraumatic.  Cardiovascular:     Rate and Rhythm: Normal rate and regular rhythm.     Heart sounds: No murmur heard.   No friction rub.  Pulmonary:     Effort: Pulmonary effort is normal. No respiratory distress.     Breath sounds: Normal breath  sounds. No wheezing or rales.  Abdominal:     General: Bowel sounds are normal. There is no distension.     Palpations: Abdomen is soft.     Tenderness: There is no abdominal tenderness.  Musculoskeletal:        General: Normal range of motion.     Cervical back: Normal range of motion and neck supple.     Right lower leg: No tenderness. No edema.     Left lower leg: No tenderness. No edema.  Skin:    General: Skin is warm and dry.  Neurological:     Mental Status: He is alert and oriented to person, place, and time.     Coordination: Coordination normal.    ED Results / Procedures / Treatments   Labs (all labs ordered are listed, but only abnormal results are displayed) Labs Reviewed - No data to display  EKG None  Radiology DG Chest 2 View  Result Date: 08/24/2020 CLINICAL DATA:  Cough, dyspnea EXAM:  CHEST - 2 VIEW COMPARISON:  None. FINDINGS: The heart size and mediastinal contours are within normal limits. Both lungs are clear. The visualized skeletal structures are unremarkable. IMPRESSION: No active cardiopulmonary disease. Electronically Signed   By: Helyn Numbers MD   On: 08/24/2020 04:20    Procedures Procedures   Medications Ordered in ED Medications - No data to display  ED Course  I have reviewed the triage vital signs and the nursing notes.  Pertinent labs & imaging results that were available during my care of the patient were reviewed by me and considered in my medical decision making (see chart for details).    MDM Rules/Calculators/A&P  Patient presenting with cough and shortness of breath.  Vitals are stable with no hypoxia and his chest x-ray is clear.  Symptoms most likely related to bronchitis.  Patient to be treated with antibiotics and steroids and continued over-the-counter medications.  I highly doubt a cardiac etiology.  Final Clinical Impression(s) / ED Diagnoses Final diagnoses:  None    Rx / DC Orders ED Discharge Orders     None        Geoffery Lyons, MD 08/24/20 [phone]

## 2021-03-28 ENCOUNTER — Encounter: Payer: Self-pay | Admitting: General Surgery

## 2021-03-28 ENCOUNTER — Other Ambulatory Visit: Payer: Self-pay

## 2021-03-28 ENCOUNTER — Ambulatory Visit (INDEPENDENT_AMBULATORY_CARE_PROVIDER_SITE_OTHER): Payer: Medicaid Other | Admitting: General Surgery

## 2021-03-28 VITALS — BP 151/93 | HR 88 | Temp 97.6°F | Resp 16 | Ht 71.0 in | Wt 248.0 lb

## 2021-03-28 DIAGNOSIS — K4091 Unilateral inguinal hernia, without obstruction or gangrene, recurrent: Secondary | ICD-10-CM | POA: Diagnosis not present

## 2021-03-28 HISTORY — PX: COSMETIC SURGERY: SHX468

## 2021-03-28 NOTE — Progress Notes (Signed)
Legend Tumminello; [id number]; [birth year]   HPI Patient is a 46 year old white male who presents back to my care for evaluation and treatment of right groin pain.  He states that over the last few months, he has had increasing pain in the right groin region where his hernia surgery was.  He has noted a slight bulge.  The pain seems to be localized just to the right groin region.  It does not seem to radiate.  He does not remember any particular event that caused the pain to start.  He does significant heavy lifting at work. Past Medical History:  Diagnosis Date   Arthritis    spine   Hyperlipidemia     Past Surgical History:  Procedure Laterality Date   broken fingers     Broken thumb     INGUINAL HERNIA REPAIR Right 11/16/2019   Procedure: HERNIA REPAIR INGUINAL ADULT;  Surgeon: Franky Macho, MD;  Location: [facility];  Service: General;  Laterality: Right;    History reviewed. No pertinent family history.  Current Outpatient Medications on File Prior to Visit  Medication Sig Dispense Refill   tiZANidine (ZANAFLEX) 4 MG tablet Take 4 mg by mouth at bedtime.      No current facility-administered medications on file prior to visit.    Allergies  Allergen Reactions   Tramadol Swelling    Leg swelling    Social History   Substance and Sexual Activity  Alcohol Use Yes   Alcohol/week: 10.0 standard drinks   Types: 10 Cans of beer per week    Social History   Tobacco Use  Smoking Status Never  Smokeless Tobacco Never    Review of Systems  Constitutional: Negative.   HENT: Negative.    Eyes: Negative.   Respiratory: Negative.    Cardiovascular: Negative.   Gastrointestinal:  Positive for abdominal pain.  Genitourinary: Negative.   Musculoskeletal: Negative.   Skin: Negative.   Neurological: Negative.   Endo/Heme/Allergies: Negative.   Psychiatric/Behavioral: Negative.     Objective   Vitals:   03/28/21 1400  BP: (!) 151/93  Pulse: 88  Resp: 16  Temp: 97.6 F  (36.4 C)  SpO2: 96%    Physical Exam Vitals reviewed.  Constitutional:      Appearance: Normal appearance. He is not ill-appearing.  HENT:     Head: Normocephalic and atraumatic.  Cardiovascular:     Rate and Rhythm: Normal rate and regular rhythm.     Heart sounds: Normal heart sounds. No murmur heard.   No friction rub. No gallop.  Pulmonary:     Effort: Pulmonary effort is normal. No respiratory distress.     Breath sounds: Normal breath sounds. No stridor. No wheezing, rhonchi or rales.  Abdominal:     General: Bowel sounds are normal. There is no distension.     Palpations: Abdomen is soft. There is no mass.     Tenderness: There is abdominal tenderness. There is no guarding or rebound.     Hernia: A hernia is present.     Comments: Patient does have a small reducible small bulge right at the internal ring on the right side.  Palpation over the internal ring recreates his pain.  Genitourinary:    Testes: Normal.  Skin:    General: Skin is warm and dry.  Neurological:     Mental Status: He is alert and oriented to person, place, and time.  Previous operative note reviewed  Assessment  Recurrent right inguinal hernia Plan  I told  the patient that he appears to have a recurrence of his right inguinal hernia right at the internal ring.  He will call to schedule a recurrent right inguinal herniorrhaphy with mesh.  The risks and benefits of the procedure including bleeding, infection, recurrence of the hernia, and the possibility of residual neuralgia or numbness were fully explained to the patient, who gave informed consent.  He was told that he would need to be off of work for at least 3 to 4 weeks.

## 2021-04-11 ENCOUNTER — Telehealth: Payer: Self-pay | Admitting: *Deleted

## 2021-04-11 ENCOUNTER — Other Ambulatory Visit (INDEPENDENT_AMBULATORY_CARE_PROVIDER_SITE_OTHER): Payer: Medicaid Other | Admitting: General Surgery

## 2021-04-11 DIAGNOSIS — Z09 Encounter for follow-up examination after completed treatment for conditions other than malignant neoplasm: Secondary | ICD-10-CM

## 2021-04-11 MED ORDER — OXYCODONE-ACETAMINOPHEN 7.5-325 MG PO TABS: 1.0000 | ORAL_TABLET | Freq: Four times a day (QID) | ORAL | 0 refills | Status: DC | PRN

## 2021-04-11 NOTE — Progress Notes (Signed)
Percocet prescribed for pain.

## 2021-04-11 NOTE — Telephone Encounter (Signed)
Received call from patient.   Reports that he is having increased pain in inguinal hernia. States that he feels sharp pain down his R leg at times.  Denies any bulging in hernia, changes in bowels, fever, etc.   Requested Dr. Lovell Sheehan prescribe medication prior to surgery for pain.   Please advise.

## 2021-04-24 ENCOUNTER — Encounter: Payer: Self-pay | Admitting: Family Medicine

## 2021-04-27 NOTE — Patient Instructions (Signed)
Evan Wilson  04/27/2021     @PREFPERIOPPHARMACY @   Your procedure is scheduled on  05/03/2021.   Report to [facility] at  0600  A.M.   Call this number if you have problems the morning of surgery:  [phone]   Remember:  Do not eat or drink after midnight.      Take these medicines the morning of surgery with A SIP OF WATER                          Oxycodone(if needed), zanaflex.     Do not wear jewelry, make-up or nail polish.  Do not wear lotions, powders, or perfumes, or deodorant.  Do not shave 48 hours prior to surgery.  Men may shave face and neck.  Do not bring valuables to the hospital.  [facility] is not responsible for any belongings or valuables.  Contacts, dentures or bridgework may not be worn into surgery.  Leave your suitcase in the car.  After surgery it may be brought to your room.  For patients admitted to the hospital, discharge time will be determined by your treatment team.  Patients discharged the day of surgery will not be allowed to drive home and must have someone with them for 24 hours.    Special instructions:   DO NOT smoke tobacco or vape for 24 hours before your procedure.  Please read over the following fact sheets that you were given. Coughing and Deep Breathing, Surgical Site Infection Prevention, Anesthesia Post-op Instructions, and Care and Recovery After Surgery      Open Hernia Repair, Adult, Care After What can I expect after the procedure? After the procedure, it is common to have: Mild discomfort. Slight bruising. Mild swelling. Pain in the belly (abdomen). A small amount of blood from the cut from surgery (incision). Follow these instructions at home: Your doctor may give you more specific instructions. If you have problems, call your doctor. Medicines Take over-the-counter and prescription medicines only as told by your doctor. If told, take steps to prevent problems with pooping (constipation). You may  need to: Drink enough fluid to keep your pee (urine) pale yellow. Take medicines. You will be told what medicines to take. Eat foods that are high in fiber. These include beans, whole grains, and fresh fruits and vegetables. Limit foods that are high in fat and sugar. These include fried or sweet foods. Ask your doctor if you should avoid driving or using machines while you are taking your medicine. Incision care  Follow instructions from your doctor about how to take care of your incision. Make sure you: Wash your hands with soap and water for at least 20 seconds before and after you change your bandage (dressing). If you cannot use soap and water, use hand sanitizer. Change your bandage. Leave stitches or skin glue in place for at least 2 weeks. Leave tape strips alone unless you are told to take them off. You may trim the edges of the tape strips if they curl up. Check your incision every day for signs of infection. Check for: More redness, swelling, or pain. More fluid or blood. Warmth. Pus or a bad smell. Wear loose, soft clothing while your incision heals. Activity  Rest as told by your doctor. Do not lift anything that is heavier than 10 lb (4.5 kg), or the limit that you are told. Do not play contact sports until your  doctor says that this is safe. If you were given a sedative during your procedure, do not drive or use machines until your doctor says that it is safe. A sedative is a medicine that helps you relax. Return to your normal activities when your doctor says that it is safe. General instructions Do not take baths, swim, or use a hot tub. Ask your doctor about taking showers or sponge baths. Hold a pillow over your belly when you cough or sneeze. This helps with pain. Do not smoke or use any products that contain nicotine or tobacco. If you need help quitting, ask your doctor. Keep all follow-up visits. Contact a doctor if: You have any of these signs of infection in  or around your incision: More redness, swelling, or pain. More fluid or blood. Warmth. Pus. A bad smell. You have a fever or chills. You have blood in your poop (stool). You have not pooped (had a bowel movement) in 2-3 days. Medicine does not help your pain. Get help right away if: You have chest pain, or you are short of breath. You feel faint or light-headed. You have very bad pain. You vomit and your pain is worse. You have pain, swelling, or redness in a leg. These symptoms may be an emergency. Get help right away. Call your local emergency services (911 in the [place] Do not wait to see if the symptoms will go away. Do not drive yourself to the hospital. Summary After this procedure, it is common to have mild discomfort, slight bruising, and mild swelling. Follow instructions from your doctor about how to take care of your cut from surgery (incision). Check every day for signs of infection. Do not lift heavy objects or play contact sports until your doctor says it is safe. Return to your normal activities as told by your doctor. This information is not intended to replace advice given to you by your health care provider. Make sure you discuss any questions you have with your health care provider. Document Revised: 09/28/2019 Document Reviewed: 09/28/2019 Elsevier Patient Education  2022 [place] Anesthesia, Adult, Care After This sheet gives you information about how to care for yourself after your procedure. Your health care provider may also give you more specific instructions. If you have problems or questions, contact your health care provider. What can I expect after the procedure? After the procedure, the following side effects are common: Pain or discomfort at the IV site. Nausea. Vomiting. Sore throat. Trouble concentrating. Feeling cold or chills. Feeling weak or tired. Sleepiness and fatigue. Soreness and body aches. These side effects can affect  parts of the body that were not involved in surgery. Follow these instructions at home: For the time period you were told by your health care provider:  Rest. Do not participate in activities where you could fall or become injured. Do not drive or use machinery. Do not drink alcohol. Do not take sleeping pills or medicines that cause drowsiness. Do not make important decisions or sign legal documents. Do not take care of children on your own. Eating and drinking Follow any instructions from your health care provider about eating or drinking restrictions. When you feel hungry, start by eating small amounts of foods that are soft and easy to digest (bland), such as toast. Gradually return to your regular diet. Drink enough fluid to keep your urine pale yellow. If you vomit, rehydrate by drinking water, juice, or clear broth. General instructions If you have sleep apnea, surgery  and certain medicines can increase your risk for breathing problems. Follow instructions from your health care provider about wearing your sleep device: Anytime you are sleeping, including during daytime naps. While taking prescription pain medicines, sleeping medicines, or medicines that make you drowsy. Have a responsible adult stay with you for the time you are told. It is important to have someone help care for you until you are awake and alert. Return to your normal activities as told by your health care provider. Ask your health care provider what activities are safe for you. Take over-the-counter and prescription medicines only as told by your health care provider. If you smoke, do not smoke without supervision. Keep all follow-up visits as told by your health care provider. This is important. Contact a health care provider if: You have nausea or vomiting that does not get better with medicine. You cannot eat or drink without vomiting. You have pain that does not get better with medicine. You are unable to  pass urine. You develop a skin rash. You have a fever. You have redness around your IV site that gets worse. Get help right away if: You have difficulty breathing. You have chest pain. You have blood in your urine or stool, or you vomit blood. Summary After the procedure, it is common to have a sore throat or nausea. It is also common to feel tired. Have a responsible adult stay with you for the time you are told. It is important to have someone help care for you until you are awake and alert. When you feel hungry, start by eating small amounts of foods that are soft and easy to digest (bland), such as toast. Gradually return to your regular diet. Drink enough fluid to keep your urine pale yellow. Return to your normal activities as told by your health care provider. Ask your health care provider what activities are safe for you. This information is not intended to replace advice given to you by your health care provider. Make sure you discuss any questions you have with your health care provider. Document Revised: 10/29/2019 Document Reviewed: 05/28/2019 Elsevier Patient Education  2022 [employer]. How to Use Chlorhexidine for Bathing Chlorhexidine gluconate (CHG) is a germ-killing (antiseptic) solution that is used to clean the skin. It can get rid of the bacteria that normally live on the skin and can keep them away for about 24 hours. To clean your skin with CHG, you may be given: A CHG solution to use in the shower or as part of a sponge bath. A prepackaged cloth that contains CHG. Cleaning your skin with CHG may help lower the risk for infection: While you are staying in the intensive care unit of the hospital. If you have a vascular access, such as a central line, to provide short-term or long-term access to your veins. If you have a catheter to drain urine from your bladder. If you are on a ventilator. A ventilator is a machine that helps you breathe by moving air in and out of  your lungs. After surgery. What are the risks? Risks of using CHG include: A skin reaction. Hearing loss, if CHG gets in your ears and you have a perforated eardrum. Eye injury, if CHG gets in your eyes and is not rinsed out. The CHG product catching fire. Make sure that you avoid smoking and flames after applying CHG to your skin. Do not use CHG: If you have a chlorhexidine allergy or have previously reacted to chlorhexidine. On babies  younger than 83 months of age. How to use CHG solution Use CHG only as told by your health care provider, and follow the instructions on the label. Use the full amount of CHG as directed. Usually, this is one bottle. During a shower Follow these steps when using CHG solution during a shower (unless your health care provider gives you different instructions): Start the shower. Use your normal soap and shampoo to wash your face and hair. Turn off the shower or move out of the shower stream. Pour the CHG onto a clean washcloth. Do not use any type of brush or rough-edged sponge. Starting at your neck, lather your body down to your toes. Make sure you follow these instructions: If you will be having surgery, pay special attention to the part of your body where you will be having surgery. Scrub this area for at least 1 minute. Do not use CHG on your head or face. If the solution gets into your ears or eyes, rinse them well with water. Avoid your genital area. Avoid any areas of skin that have broken skin, cuts, or scrapes. Scrub your back and under your arms. Make sure to wash skin folds. Let the lather sit on your skin for 1-2 minutes or as long as told by your health care provider. Thoroughly rinse your entire body in the shower. Make sure that all body creases and crevices are rinsed well. Dry off with a clean towel. Do not put any substances on your body afterward--such as powder, lotion, or perfume--unless you are told to do so by your health care  provider. Only use lotions that are recommended by the manufacturer. Put on clean clothes or pajamas. If it is the night before your surgery, sleep in clean sheets.  During a sponge bath Follow these steps when using CHG solution during a sponge bath (unless your health care provider gives you different instructions): Use your normal soap and shampoo to wash your face and hair. Pour the CHG onto a clean washcloth. Starting at your neck, lather your body down to your toes. Make sure you follow these instructions: If you will be having surgery, pay special attention to the part of your body where you will be having surgery. Scrub this area for at least 1 minute. Do not use CHG on your head or face. If the solution gets into your ears or eyes, rinse them well with water. Avoid your genital area. Avoid any areas of skin that have broken skin, cuts, or scrapes. Scrub your back and under your arms. Make sure to wash skin folds. Let the lather sit on your skin for 1-2 minutes or as long as told by your health care provider. Using a different clean, wet washcloth, thoroughly rinse your entire body. Make sure that all body creases and crevices are rinsed well. Dry off with a clean towel. Do not put any substances on your body afterward--such as powder, lotion, or perfume--unless you are told to do so by your health care provider. Only use lotions that are recommended by the manufacturer. Put on clean clothes or pajamas. If it is the night before your surgery, sleep in clean sheets. How to use CHG prepackaged cloths Only use CHG cloths as told by your health care provider, and follow the instructions on the label. Use the CHG cloth on clean, dry skin. Do not use the CHG cloth on your head or face unless your health care provider tells you to. When washing with the CHG  cloth: Avoid your genital area. Avoid any areas of skin that have broken skin, cuts, or scrapes. Before surgery Follow these steps  when using a CHG cloth to clean before surgery (unless your health care provider gives you different instructions): Using the CHG cloth, vigorously scrub the part of your body where you will be having surgery. Scrub using a back-and-forth motion for 3 minutes. The area on your body should be completely wet with CHG when you are done scrubbing. Do not rinse. Discard the cloth and let the area air-dry. Do not put any substances on the area afterward, such as powder, lotion, or perfume. Put on clean clothes or pajamas. If it is the night before your surgery, sleep in clean sheets.  For general bathing Follow these steps when using CHG cloths for general bathing (unless your health care provider gives you different instructions). Use a separate CHG cloth for each area of your body. Make sure you wash between any folds of skin and between your fingers and toes. Wash your body in the following order, switching to a new cloth after each step: The front of your neck, shoulders, and chest. Both of your arms, under your arms, and your hands. Your stomach and groin area, avoiding the genitals. Your right leg and foot. Your left leg and foot. The back of your neck, your back, and your buttocks. Do not rinse. Discard the cloth and let the area air-dry. Do not put any substances on your body afterward--such as powder, lotion, or perfume--unless you are told to do so by your health care provider. Only use lotions that are recommended by the manufacturer. Put on clean clothes or pajamas. Contact a health care provider if: Your skin gets irritated after scrubbing. You have questions about using your solution or cloth. You swallow any chlorhexidine. Call your local poison control center (1-(714)527-2997 in the [place] Get help right away if: Your eyes itch badly, or they become very red or swollen. Your skin itches badly and is red or swollen. Your hearing changes. You have trouble seeing. You have swelling or  tingling in your mouth or throat. You have trouble breathing. These symptoms may represent a serious problem that is an emergency. Do not wait to see if the symptoms will go away. Get medical help right away. Call your local emergency services (911 in the [place] Do not drive yourself to the hospital. Summary Chlorhexidine gluconate (CHG) is a germ-killing (antiseptic) solution that is used to clean the skin. Cleaning your skin with CHG may help to lower your risk for infection. You may be given CHG to use for bathing. It may be in a bottle or in a prepackaged cloth to use on your skin. Carefully follow your health care provider's instructions and the instructions on the product label. Do not use CHG if you have a chlorhexidine allergy. Contact your health care provider if your skin gets irritated after scrubbing. This information is not intended to replace advice given to you by your health care provider. Make sure you discuss any questions you have with your health care provider. Document Revised: 04/25/2020 Document Reviewed: 04/25/2020 Elsevier Patient Education  2022 [employer].

## 2021-05-02 ENCOUNTER — Encounter (HOSPITAL_COMMUNITY)
Admission: RE | Admit: 2021-05-02 | Discharge: 2021-05-02 | Disposition: A | Discharge status: Home / Self Care | Payer: 59 | Source: Ambulatory Visit | Attending: General Surgery | Admitting: General Surgery

## 2021-05-02 ENCOUNTER — Encounter (HOSPITAL_COMMUNITY): Payer: Self-pay

## 2021-05-02 DIAGNOSIS — Z01818 Encounter for other preprocedural examination: Secondary | ICD-10-CM | POA: Diagnosis present

## 2021-05-03 ENCOUNTER — Ambulatory Visit (HOSPITAL_BASED_OUTPATIENT_CLINIC_OR_DEPARTMENT_OTHER): Payer: 59 | Admitting: Certified Registered"

## 2021-05-03 ENCOUNTER — Other Ambulatory Visit: Payer: Self-pay

## 2021-05-03 ENCOUNTER — Encounter (HOSPITAL_COMMUNITY): Payer: Self-pay | Admitting: General Surgery

## 2021-05-03 ENCOUNTER — Ambulatory Visit (HOSPITAL_COMMUNITY): Payer: 59 | Admitting: Certified Registered"

## 2021-05-03 ENCOUNTER — Encounter (HOSPITAL_COMMUNITY)
Admission: RE | Disposition: A | Discharge status: Home / Self Care | Payer: Self-pay | Source: Ambulatory Visit | Attending: General Surgery

## 2021-05-03 ENCOUNTER — Ambulatory Visit (HOSPITAL_COMMUNITY)
Admission: RE | Admit: 2021-05-03 | Discharge: 2021-05-03 | Disposition: A | Discharge status: Home / Self Care | Payer: 59 | Source: Ambulatory Visit | Attending: General Surgery | Admitting: General Surgery

## 2021-05-03 DIAGNOSIS — K4091 Unilateral inguinal hernia, without obstruction or gangrene, recurrent: Secondary | ICD-10-CM

## 2021-05-03 DIAGNOSIS — E785 Hyperlipidemia, unspecified: Secondary | ICD-10-CM | POA: Insufficient documentation

## 2021-05-03 HISTORY — PX: INGUINAL HERNIA REPAIR: SHX194

## 2021-05-03 SURGERY — REPAIR, HERNIA, INGUINAL, ADULT
Anesthesia: General | Site: Groin | Laterality: Right

## 2021-05-03 MED ORDER — CEFAZOLIN SODIUM-DEXTROSE 2-4 GM/100ML-% IV SOLN
2.0000 g | INTRAVENOUS | Status: AC
  Administered 2021-05-03: 2 g via INTRAVENOUS

## 2021-05-03 MED ORDER — MIDAZOLAM HCL 2 MG/2ML IJ SOLN
INTRAMUSCULAR | Status: AC
  Filled 2021-05-03: qty 2

## 2021-05-03 MED ORDER — PROPOFOL 10 MG/ML IV BOLUS
INTRAVENOUS | Status: DC | PRN
  Administered 2021-05-03: 200 mg via INTRAVENOUS

## 2021-05-03 MED ORDER — CHLORHEXIDINE GLUCONATE CLOTH 2 % EX PADS: 6.0000 | MEDICATED_PAD | Freq: Once | CUTANEOUS | Status: DC

## 2021-05-03 MED ORDER — SODIUM CHLORIDE 0.9 % IR SOLN
Status: DC | PRN
  Administered 2021-05-03: 1000 mL

## 2021-05-03 MED ORDER — CHLORHEXIDINE GLUCONATE 0.12 % MT SOLN
15.0000 mL | Freq: Once | OROMUCOSAL | Status: AC
  Administered 2021-05-03: 15 mL via OROMUCOSAL

## 2021-05-03 MED ORDER — ROCURONIUM BROMIDE 10 MG/ML (PF) SYRINGE
PREFILLED_SYRINGE | INTRAVENOUS | Status: AC
  Filled 2021-05-03: qty 10

## 2021-05-03 MED ORDER — LACTATED RINGERS IV SOLN: INTRAVENOUS | Status: DC

## 2021-05-03 MED ORDER — KETOROLAC TROMETHAMINE 30 MG/ML IJ SOLN
30.0000 mg | Freq: Once | INTRAMUSCULAR | Status: AC
  Administered 2021-05-03: 30 mg via INTRAVENOUS
  Filled 2021-05-03: qty 1

## 2021-05-03 MED ORDER — BUPIVACAINE HCL (300 MG DOSE) 3 X 100 MG IL IMPL
DRUG_IMPLANT | Status: DC | PRN
  Administered 2021-05-03: 200 mg

## 2021-05-03 MED ORDER — PROPOFOL 10 MG/ML IV BOLUS
INTRAVENOUS | Status: AC
  Filled 2021-05-03: qty 20

## 2021-05-03 MED ORDER — LIDOCAINE 2% (20 MG/ML) 5 ML SYRINGE
INTRAMUSCULAR | Status: DC | PRN
Start: 2021-05-03 — End: 2021-05-03
  Administered 2021-05-03: 100 mg via INTRAVENOUS

## 2021-05-03 MED ORDER — MIDAZOLAM HCL 5 MG/5ML IJ SOLN
INTRAMUSCULAR | Status: DC | PRN
  Administered 2021-05-03: 2 mg via INTRAVENOUS

## 2021-05-03 MED ORDER — KETOROLAC TROMETHAMINE 30 MG/ML IJ SOLN
INTRAMUSCULAR | Status: DC | PRN
  Administered 2021-05-03: 30 mg via INTRAVENOUS

## 2021-05-03 MED ORDER — BUPIVACAINE HCL (300 MG DOSE) 3 X 100 MG IL IMPL
DRUG_IMPLANT | Status: AC
  Filled 2021-05-03: qty 100

## 2021-05-03 MED ORDER — CEFAZOLIN SODIUM-DEXTROSE 2-4 GM/100ML-% IV SOLN
INTRAVENOUS | Status: AC
  Filled 2021-05-03: qty 100

## 2021-05-03 MED ORDER — FENTANYL CITRATE (PF) 250 MCG/5ML IJ SOLN
INTRAMUSCULAR | Status: AC
  Filled 2021-05-03: qty 5

## 2021-05-03 MED ORDER — LIDOCAINE HCL (PF) 2 % IJ SOLN
INTRAMUSCULAR | Status: AC
  Filled 2021-05-03: qty 5

## 2021-05-03 MED ORDER — DEXAMETHASONE SODIUM PHOSPHATE 10 MG/ML IJ SOLN
INTRAMUSCULAR | Status: DC | PRN
  Administered 2021-05-03: 10 mg via INTRAVENOUS

## 2021-05-03 MED ORDER — ORAL CARE MOUTH RINSE: 15.0000 mL | Freq: Once | OROMUCOSAL | Status: AC

## 2021-05-03 MED ORDER — OXYCODONE-ACETAMINOPHEN 7.5-325 MG PO TABS: 1.0000 | ORAL_TABLET | ORAL | 0 refills | Status: DC | PRN

## 2021-05-03 MED ORDER — DEXAMETHASONE SODIUM PHOSPHATE 10 MG/ML IJ SOLN
INTRAMUSCULAR | Status: AC
  Filled 2021-05-03: qty 1

## 2021-05-03 MED ORDER — ONDANSETRON HCL 4 MG/2ML IJ SOLN
INTRAMUSCULAR | Status: DC | PRN
  Administered 2021-05-03: 4 mg via INTRAVENOUS

## 2021-05-03 MED ORDER — FENTANYL CITRATE (PF) 250 MCG/5ML IJ SOLN
INTRAMUSCULAR | Status: DC | PRN
  Administered 2021-05-03 (×4): 25 ug via INTRAVENOUS
  Administered 2021-05-03: 50 ug via INTRAVENOUS

## 2021-05-03 SURGICAL SUPPLY — 32 items
ADH SKN CLS APL DERMABOND .7 (GAUZE/BANDAGES/DRESSINGS) ×1
CLOTH BEACON ORANGE TIMEOUT ST (SAFETY) ×2 IMPLANT
COVER LIGHT HANDLE STERIS (MISCELLANEOUS) ×4 IMPLANT
DERMABOND ADVANCED (GAUZE/BANDAGES/DRESSINGS) ×1
DERMABOND ADVANCED .7 DNX12 (GAUZE/BANDAGES/DRESSINGS) ×1 IMPLANT
DRAIN PENROSE 0.5X18 (DRAIN) ×2 IMPLANT
ELECT REM PT RETURN 9FT ADLT (ELECTROSURGICAL) ×2
ELECTRODE REM PT RTRN 9FT ADLT (ELECTROSURGICAL) ×1 IMPLANT
GAUZE 4X4 16PLY ~~LOC~~+RFID DBL (SPONGE) ×2 IMPLANT
GAUZE SPONGE 4X4 12PLY STRL (GAUZE/BANDAGES/DRESSINGS) ×2 IMPLANT
GLOVE SURG LTX SZ6.5 (GLOVE) ×1 IMPLANT
GLOVE SURG POLYISO LF SZ6.5 (GLOVE) ×1 IMPLANT
GLOVE SURG POLYISO LF SZ7.5 (GLOVE) ×2 IMPLANT
GLOVE SURG UNDER POLY LF SZ6.5 (GLOVE) ×1 IMPLANT
GLOVE SURG UNDER POLY LF SZ7 (GLOVE) ×7 IMPLANT
GOWN STRL REUS W/ TWL LRG LVL3 (GOWN DISPOSABLE) IMPLANT
GOWN STRL REUS W/TWL LRG LVL3 (GOWN DISPOSABLE) ×8 IMPLANT
INST SET MINOR GENERAL (KITS) ×2 IMPLANT
KIT TURNOVER KIT A (KITS) ×2 IMPLANT
MANIFOLD NEPTUNE II (INSTRUMENTS) ×2 IMPLANT
NS IRRIG 1000ML POUR BTL (IV SOLUTION) ×2 IMPLANT
PACK MINOR (CUSTOM PROCEDURE TRAY) ×2 IMPLANT
PAD ARMBOARD 7.5X6 YLW CONV (MISCELLANEOUS) ×2 IMPLANT
SET BASIN LINEN APH (SET/KITS/TRAYS/PACK) ×2 IMPLANT
SOL PREP PROV IODINE SCRUB 4OZ (MISCELLANEOUS) ×2 IMPLANT
SPONGE INTESTINAL PEANUT (DISPOSABLE) ×1 IMPLANT
SUT MNCRL AB 4-0 PS2 18 (SUTURE) ×2 IMPLANT
SUT NOVA NAB GS-22 2 2-0 T-19 (SUTURE) ×4 IMPLANT
SUT VIC AB 2-0 CT1 27 (SUTURE) ×4
SUT VIC AB 2-0 CT1 TAPERPNT 27 (SUTURE) ×1 IMPLANT
SUT VIC AB 3-0 SH 27 (SUTURE) ×2
SUT VIC AB 3-0 SH 27X BRD (SUTURE) ×1 IMPLANT

## 2021-05-03 NOTE — Anesthesia Preprocedure Evaluation (Signed)
Anesthesia Evaluation  Patient identified by MRN, date of birth, ID band Patient awake    Reviewed: Allergy & Precautions, H&P , NPO status , Patient's Chart, lab work & pertinent test results, reviewed documented beta blocker date and time   Airway Mallampati: II  TM Distance: >3 FB Neck ROM: full    Dental no notable dental hx.    Pulmonary neg pulmonary ROS,    Pulmonary exam normal breath sounds clear to auscultation       Cardiovascular Exercise Tolerance: Good negative cardio ROS   Rhythm:regular Rate:Normal     Neuro/Psych negative neurological ROS  negative psych ROS   GI/Hepatic negative GI ROS, Neg liver ROS,   Endo/Other  negative endocrine ROS  Renal/GU negative Renal ROS  negative genitourinary   Musculoskeletal   Abdominal   Peds  Hematology negative hematology ROS (+)   Anesthesia Other Findings   Reproductive/Obstetrics negative OB ROS                             Anesthesia Physical Anesthesia Plan  ASA: 2  Anesthesia Plan: General and General LMA   Post-op Pain Management:    Induction:   PONV Risk Score and Plan: Ondansetron  Airway Management Planned:   Additional Equipment:   Intra-op Plan:   Post-operative Plan:   Informed Consent: I have reviewed the patients History and Physical, chart, labs and discussed the procedure including the risks, benefits and alternatives for the proposed anesthesia with the patient or authorized representative who has indicated his/her understanding and acceptance.     Dental Advisory Given  Plan Discussed with: CRNA  Anesthesia Plan Comments:         Anesthesia Quick Evaluation  

## 2021-05-03 NOTE — Anesthesia Postprocedure Evaluation (Signed)
Anesthesia Post Note ? ?Patient: GIANMARCO ROYE ? ?Procedure(s) Performed: RECURRENT HERNIA REPAIR INGUINAL ADULT (Right: Groin) ? ?Patient location during evaluation: Phase II ?Anesthesia Type: General ?Level of consciousness: awake ?Pain management: pain level controlled ?Vital Signs Assessment: post-procedure vital signs reviewed and stable ?Respiratory status: spontaneous breathing and respiratory function stable ?Cardiovascular status: blood pressure returned to baseline and stable ?Postop Assessment: no headache and no apparent nausea or vomiting ?Anesthetic complications: no ?Comments: Late entry ? ? ?No notable events documented. ? ? ?Last Vitals:  ?Vitals:  ? 05/03/21 0930 05/03/21 0948  ?BP: 131/81 (!) 138/95  ?Pulse: 75 79  ?Resp: 12   ?Temp:  36.6 ?C  ?SpO2: 98% 95%  ?  ?Last Pain:  ?Vitals:  ? 05/03/21 0948  ?TempSrc: Oral  ?PainSc: 0-No pain  ? ? ?  ?  ?  ?  ?  ?  ? ?Windell Norfolk ? ? ? ? ?

## 2021-05-03 NOTE — Op Note (Signed)
Patient:  Evan Wilson ? ?DOB:  1975-05-28 ? ?MRN:  [id number] ? ? ?Preop Diagnosis: Recurrent right inguinal hernia ? ?Postop Diagnosis: Same ? ?Procedure: Recurrent right inguinal herniorrhaphy ? ?Surgeon: Aviva Signs, MD ? ?Anes: General ? ?Indications: Patient is a 46 year old white male status post a right inguinal herniorrhaphy with mesh in 2021 who over the past few months has had increasing pain over the internal ring on the right side.  Pain is noted to palpation over the internal ring.  The patient now presents for a recurrent right inguinal herniorrhaphy.  The risks and benefits of the procedure including bleeding, infection, mesh use, neuralgia, and the possibility of recurrence of the pain were fully explained to the patient, who gave informed consent. ? ?Procedure note: The patient was placed in supine position.  After general anesthesia was administered, the right groin region was prepped and draped using the usual sterile technique with Betadine.  Surgical site confirmation was performed. ? ?Incision was made in the right groin region through the previous surgical incision site.  The dissection was taken down to the external oblique aponeurosis.  The aponeurosis was incised to the external ring.  There was significant ingrowth of tissue and this was sharply freed away superiorly up to the conjoined tendon and inferiorly to the shelving edge of Poupart's ligament.  I was able to identify the spermatic cord.  The vas deferens was noted within the spermatic cord.  No hernia defect was noted along the floor of the inguinal canal, especially at the pubic tubercle.  A small hernia/lipoma of the cord was noted at the internal ring.  This was freed away from the spermatic cord up to the peritoneal reflection and excised.  It was disposed of.  I did not identify the ilioinguinal nerve and the dissection.  As there was significant scarring, I did not attempt to identify it as he only had point tenderness and  no referral of the pain.  Robynn Pane was placed over the previous mesh repair and along the floor of the inguinal canal.  It was likewise placed in the subcutaneous tissue.  The external oblique aponeurosis was reapproximated using 2-0 Vicryl interrupted sutures.  The subcutaneous layer was reapproximated using 3-0 Vicryl interrupted sutures.  The skin was closed using a 4-0 Monocryl subcuticular suture.  Dermabond was applied. ? ?All tape and needle counts were correct at the end of the procedure.  The patient was awakened and transferred to PACU in stable condition. ? ?Complications: None ? ?EBL: Minimal ? ?Specimen: None ? ? ?  ?

## 2021-05-03 NOTE — Transfer of Care (Signed)
Immediate Anesthesia Transfer of Care Note ? ?Patient: Evan Wilson ? ?Procedure(s) Performed: RECURRENT HERNIA REPAIR INGUINAL ADULT (Right: Groin) ? ?Patient Location: PACU ? ?Anesthesia Type:General ? ?Level of Consciousness: awake, alert , oriented and patient cooperative ? ?Airway & Oxygen Therapy: Patient Spontanous Breathing and Patient connected to nasal cannula oxygen ? ?Post-op Assessment: Report given to RN, Post -op Vital signs reviewed and stable and Patient moving all extremities ? ?Post vital signs: Reviewed and stable ? ?Last Vitals:  ?Vitals Value Taken Time  ?BP 142/97 05/03/21 0851  ?Temp    ?Pulse 86 05/03/21 0853  ?Resp 8 05/03/21 0853  ?SpO2 96 % 05/03/21 0853  ?Vitals shown include unvalidated device data. ? ?Last Pain:  ?Vitals:  ? 05/03/21 0704  ?TempSrc: Oral  ?PainSc: 0-No pain  ?   ? ?Patients Stated Pain Goal: 8 (05/03/21 0704) ? ?Complications: No notable events documented. ?

## 2021-05-03 NOTE — Anesthesia Procedure Notes (Signed)
Procedure Name: LMA Insertion ?Date/Time: 05/03/2021 7:34 AM ?Performed by: Myna Bright, CRNA ?Pre-anesthesia Checklist: Patient identified, Emergency Drugs available, Suction available and Patient being monitored ?Patient Re-evaluated:Patient Re-evaluated prior to induction ?Oxygen Delivery Method: Circle system utilized ?Preoxygenation: Pre-oxygenation with 100% oxygen ?Induction Type: IV induction ?LMA: LMA inserted ?LMA Size: 4.0 ?Number of attempts: 1 ?Placement Confirmation: positive ETCO2 and breath sounds checked- equal and bilateral ?Tube secured with: Tape ?Dental Injury: Teeth and Oropharynx as per pre-operative assessment  ? ? ? ? ?

## 2021-05-03 NOTE — H&P (Signed)
Evan Wilson; [id number]; 11/20/1975 ° ° °HPI °Patient is a 46-year-old white male who presents back to my care for evaluation and treatment of right groin pain.  He states that over the last few months, he has had increasing pain in the right groin region where his hernia surgery was.  He has noted a slight bulge.  The pain seems to be localized just to the right groin region.  It does not seem to radiate.  He does not remember any particular event that caused the pain to start.  He does significant heavy lifting at work. °Past Medical History:  °Diagnosis Date  ° Arthritis   ° spine  ° Hyperlipidemia   ° ° °Past Surgical History:  °Procedure Laterality Date  ° broken fingers    ° Broken thumb    ° INGUINAL HERNIA REPAIR Right 11/16/2019  ° Procedure: HERNIA REPAIR INGUINAL ADULT;  Surgeon: Zyire Eidson, MD;  Location: [facility];  Service: General;  Laterality: Right;  ° ° °History reviewed. No pertinent family history. ° °Current Outpatient Medications on File Prior to Visit  °Medication Sig Dispense Refill  ° tiZANidine (ZANAFLEX) 4 MG tablet Take 4 mg by mouth at bedtime.     ° °No current facility-administered medications on file prior to visit.  ° ° °Allergies  °Allergen Reactions  ° Tramadol Swelling  °  Leg swelling  ° ° °Social History  ° °Substance and Sexual Activity  °Alcohol Use Yes  ° Alcohol/week: 10.0 standard drinks  ° Types: 10 Cans of beer per week  ° ° °Social History  ° °Tobacco Use  °Smoking Status Never  °Smokeless Tobacco Never  ° ° °Review of Systems  °Constitutional: Negative.   °HENT: Negative.    °Eyes: Negative.   °Respiratory: Negative.    °Cardiovascular: Negative.   °Gastrointestinal:  Positive for abdominal pain.  °Genitourinary: Negative.   °Musculoskeletal: Negative.   °Skin: Negative.   °Neurological: Negative.   °Endo/Heme/Allergies: Negative.   °Psychiatric/Behavioral: Negative.    ° °Objective  ° °Vitals:  ° 03/28/21 1400  °BP: (!) 151/93  °Pulse: 88  °Resp: 16  °Temp: 97.6 °F  (36.4 °C)  °SpO2: 96%  ° ° °Physical Exam °Vitals reviewed.  °Constitutional:   °   Appearance: Normal appearance. He is not ill-appearing.  °HENT:  °   Head: Normocephalic and atraumatic.  °Cardiovascular:  °   Rate and Rhythm: Normal rate and regular rhythm.  °   Heart sounds: Normal heart sounds. No murmur heard. °  No friction rub. No gallop.  °Pulmonary:  °   Effort: Pulmonary effort is normal. No respiratory distress.  °   Breath sounds: Normal breath sounds. No stridor. No wheezing, rhonchi or rales.  °Abdominal:  °   General: Bowel sounds are normal. There is no distension.  °   Palpations: Abdomen is soft. There is no mass.  °   Tenderness: There is abdominal tenderness. There is no guarding or rebound.  °   Hernia: A hernia is present.  °   Comments: Patient does have a small reducible small bulge right at the internal ring on the right side.  Palpation over the internal ring recreates his pain.  °Genitourinary: °   Testes: Normal.  °Skin: °   General: Skin is warm and dry.  °Neurological:  °   Mental Status: He is alert and oriented to person, place, and time.  °Previous operative note reviewed ° °Assessment  °Recurrent right inguinal hernia °Plan  °I told   the patient that he appears to have a recurrence of his right inguinal hernia right at the internal ring.  He will call to schedule a recurrent right inguinal herniorrhaphy with mesh.  The risks and benefits of the procedure including bleeding, infection, recurrence of the hernia, and the possibility of residual neuralgia or numbness were fully explained to the patient, who gave informed consent.  He was told that he would need to be off of work for at least 3 to 4 weeks. ?

## 2021-05-05 ENCOUNTER — Encounter (HOSPITAL_COMMUNITY): Payer: Self-pay | Admitting: General Surgery

## 2021-05-05 ENCOUNTER — Telehealth: Payer: Self-pay | Admitting: Family Medicine

## 2021-05-05 NOTE — Telephone Encounter (Signed)
FMLA paperwork completed and faxed to [employer] at [phone]. Confirmation received. ? ?Out of work 05/03/21 - 06/19/2021.  ? ?RECURRENT HERNIA REPAIR INGUINAL ADULT (Right: Groin)  ? ?

## 2021-05-11 ENCOUNTER — Other Ambulatory Visit: Payer: Self-pay | Admitting: General Surgery

## 2021-05-11 ENCOUNTER — Telehealth: Payer: Self-pay | Admitting: *Deleted

## 2021-05-11 ENCOUNTER — Other Ambulatory Visit: Payer: Self-pay

## 2021-05-11 ENCOUNTER — Encounter: Payer: Self-pay | Admitting: General Surgery

## 2021-05-11 ENCOUNTER — Ambulatory Visit (INDEPENDENT_AMBULATORY_CARE_PROVIDER_SITE_OTHER): Payer: 59 | Admitting: General Surgery

## 2021-05-11 VITALS — BP 136/88 | HR 89 | Temp 98.0°F | Resp 14 | Ht 71.0 in | Wt 243.0 lb

## 2021-05-11 DIAGNOSIS — Z09 Encounter for follow-up examination after completed treatment for conditions other than malignant neoplasm: Secondary | ICD-10-CM

## 2021-05-11 NOTE — Progress Notes (Signed)
Subjective:  ?  ? Evan Wilson  ?Here for postoperative visit, status post recurrent right inguinal herniorrhaphy.  Patient states he is doing well.  His ecchymosis is resolving.  He states he has intermittent incisional pain. ?Objective:  ? ? BP 136/88   Pulse 89   Temp 98 ?F (36.7 ?C) (Other (Comment))   Resp 14   Ht 5\' 11"  (1.803 m)   Wt 243 lb (110.2 kg)   SpO2 98%   BMI 33.89 kg/m?  ? ?General:  alert, cooperative, and no distress  ?Abdomen soft, incision healing well.  Resolving ecchymosis present. ?   ? ?Assessment:  ? ? Doing well postoperatively.  ?  ?Plan:  ? ?May start increasing his activity as able.  Avoid heavy lifting.  Follow-up here on 06/13/2021. ?

## 2021-05-11 NOTE — Telephone Encounter (Signed)
Received call from patient [phone]~ telephone.  ? ?Surgical Date: 05/03/2021 ?Procedure: Inguinal Hernia Repair ? ?Allergies: Tramadol ?Pharmacy: [facility], [facility] ? ?Patient requesting refill on Oxycodone/ APAP 7.5/325 mg. Last refill 05/03/2021, #30 tabs.  ?

## 2021-05-12 ENCOUNTER — Other Ambulatory Visit: Payer: Self-pay | Admitting: General Surgery

## 2021-05-15 ENCOUNTER — Ambulatory Visit (INDEPENDENT_AMBULATORY_CARE_PROVIDER_SITE_OTHER): Payer: 59 | Admitting: General Surgery

## 2021-05-15 ENCOUNTER — Encounter: Payer: Self-pay | Admitting: General Surgery

## 2021-05-15 ENCOUNTER — Other Ambulatory Visit: Payer: Self-pay

## 2021-05-15 VITALS — BP 143/91 | HR 94 | Temp 97.1°F | Resp 14 | Ht 71.0 in | Wt 243.0 lb

## 2021-05-15 DIAGNOSIS — Z09 Encounter for follow-up examination after completed treatment for conditions other than malignant neoplasm: Secondary | ICD-10-CM

## 2021-05-15 MED ORDER — OXYCODONE-ACETAMINOPHEN 7.5-325 MG PO TABS: 1.0000 | ORAL_TABLET | ORAL | 0 refills | Status: DC | PRN

## 2021-05-15 NOTE — Progress Notes (Signed)
Subjective:  ?  ? Evan Wilson  ?Patient presents for a wound check.  He states his wound started to swell recently.  He denies any fever or chills.  No drainage has been noted from the wound. ?Objective:  ? ? BP (!) 143/91   Pulse 94   Temp (!) 97.1 ?F (36.2 ?C) (Other (Comment))   Resp 14   Ht 5\' 11"  (1.803 m)   Wt 243 lb (110.2 kg)   SpO2 96%   BMI 33.89 kg/m?  ? ?General:  alert, cooperative, and no distress  ?Abdomen is soft.  The right groin incision is well approximated but there is a soft tissue swelling present deep to this.  A 40 cc seroma was aspirated.  Resolution of the swelling was noted.  No purulent drainage was present.  No erythema is present. ?   ? ?Assessment:  ? ? Postoperative wound seroma, otherwise doing well.  ?  ?Plan:  ? ?I did reorder his pain medication.  I told him to come back in 2 weeks for wound check, sooner if the swelling recurs. ?

## 2021-05-16 ENCOUNTER — Telehealth: Payer: Self-pay | Admitting: Family Medicine

## 2021-05-16 NOTE — Telephone Encounter (Signed)
STD paperwork completed and faxed to [employer] at [phone]. Confirmation received. ? ?Patient out of work starting 05/03/2021 and may return to work unrestricted 06/19/2021. ?

## 2021-05-23 ENCOUNTER — Encounter: Payer: 59 | Admitting: General Surgery

## 2021-05-30 ENCOUNTER — Other Ambulatory Visit: Payer: Self-pay

## 2021-05-30 ENCOUNTER — Encounter: Payer: Self-pay | Admitting: General Surgery

## 2021-05-30 ENCOUNTER — Ambulatory Visit (INDEPENDENT_AMBULATORY_CARE_PROVIDER_SITE_OTHER): Payer: 59 | Admitting: General Surgery

## 2021-05-30 VITALS — BP 123/76 | HR 85 | Temp 98.3°F | Resp 16 | Ht 71.0 in | Wt 236.0 lb

## 2021-05-30 DIAGNOSIS — Z09 Encounter for follow-up examination after completed treatment for conditions other than malignant neoplasm: Secondary | ICD-10-CM

## 2021-05-30 NOTE — Progress Notes (Signed)
Subjective:  ?  ? Evan Wilson  ?Patient here for follow-up wound check, status post recurrent right inguinal herniorrhaphy with mesh.  Patient was lifting something heavy last week and developed right groin pain.  Swelling that was present has resolved.  He is still avoiding any significant heavy lifting. ?Objective:  ? ? BP 123/76   Pulse 85   Temp 98.3 ?F (36.8 ?C) (Other (Comment))   Resp 16   Ht 5\' 11"  (1.803 m)   Wt 236 lb (107 kg)   SpO2 92%   BMI 32.92 kg/m?  ? ?General:  alert, cooperative, and no distress  ?Right inguinal incision well-healed.  Seroma resolved. ?   ? ?Assessment:  ? ?Postoperative wound seroma resolved. ?  ?Plan:  ? ?Follow-up here in 2 weeks to assess return to work.  May increase activity as able. ?

## 2021-06-13 ENCOUNTER — Other Ambulatory Visit: Payer: Self-pay

## 2021-06-13 ENCOUNTER — Ambulatory Visit (INDEPENDENT_AMBULATORY_CARE_PROVIDER_SITE_OTHER): Payer: 59 | Admitting: General Surgery

## 2021-06-13 ENCOUNTER — Encounter: Payer: Self-pay | Admitting: *Deleted

## 2021-06-13 ENCOUNTER — Encounter: Payer: Self-pay | Admitting: General Surgery

## 2021-06-13 VITALS — BP 152/93 | HR 89 | Temp 98.1°F | Resp 16 | Ht 71.0 in | Wt 240.0 lb

## 2021-06-13 DIAGNOSIS — Z09 Encounter for follow-up examination after completed treatment for conditions other than malignant neoplasm: Secondary | ICD-10-CM

## 2021-06-13 NOTE — Progress Notes (Signed)
Subjective:  ?  ? Algis Greenhouse  ?Patient returns for follow-up of his recurrent right inguinal hernia repair.  He has developed a swelling underneath the incision.  It is tender to touch.  He denies any fevers. ?Objective:  ? ? BP (!) 152/93   Pulse 89   Temp 98.1 ?F (36.7 ?C) (Oral)   Resp 16   Ht 5\' 11"  (1.803 m)   Wt 240 lb (108.9 kg)   SpO2 96%   BMI 33.47 kg/m?  ? ?General:  alert, cooperative, and no distress  ?A 60 cc seroma was aspirated from the right inguinal wound. ?   ? ?Assessment:  ? ? Postoperative course complicated by recurrent seroma  ?  ?Plan:  ? ?We will see patient again in 1 week for follow-up.  He should not work until I have seen him in follow-up. ?

## 2021-06-14 ENCOUNTER — Telehealth: Payer: Self-pay | Admitting: *Deleted

## 2021-06-14 NOTE — Telephone Encounter (Signed)
Received call from patient [phone]~ telephone.  ? ?Patient reports that swelling to R inguinal hernia repair site has returned after aspiration and draining on 06/13/2021. Reports that area is small, but it is noticeable. States that he feels comfortable waiting to next week to re-evaluate.  ? ?Patient reports that he has increased pain in area of aspiration and down leg rating 7- 7.5. Patient is currently taking Aleve.  ? ?Allergies: Tramadol ?Pharmacy: [facility], [facility] ? ?Please advise.  ?

## 2021-06-14 NOTE — Telephone Encounter (Signed)
Discussed call with Dr. Lovell Sheehan who recommends continuing conservative treatment at this time with Aleve/ IBU/ Ice Packs. Advised if pain is not helped with above measures, contact office for further recommendations.  ? ?Call placed to patient and patient made aware. Patient verbalized understanding.  ?

## 2021-06-20 ENCOUNTER — Encounter: Payer: 59 | Admitting: General Surgery

## 2021-06-22 ENCOUNTER — Encounter: Payer: Self-pay | Admitting: General Surgery

## 2021-06-22 ENCOUNTER — Other Ambulatory Visit: Payer: Self-pay

## 2021-06-22 ENCOUNTER — Encounter: Payer: Self-pay | Admitting: *Deleted

## 2021-06-22 ENCOUNTER — Ambulatory Visit (INDEPENDENT_AMBULATORY_CARE_PROVIDER_SITE_OTHER): Payer: 59 | Admitting: General Surgery

## 2021-06-22 VITALS — BP 154/99 | HR 84 | Temp 98.1°F | Resp 16 | Ht 71.0 in | Wt 247.0 lb

## 2021-06-22 DIAGNOSIS — Z09 Encounter for follow-up examination after completed treatment for conditions other than malignant neoplasm: Secondary | ICD-10-CM

## 2021-06-22 NOTE — Progress Notes (Signed)
Subjective:  ?  ? Evan Wilson  ?Patient here for postoperative visit.  States the swelling is not as severe as it has been.  His pain has significantly decreased. ?Objective:  ? ? BP (!) 154/99   Pulse 84   Temp 98.1 ?F (36.7 ?C) (Oral)   Resp 16   Ht 5\' 11"  (1.803 m)   Wt 247 lb (112 kg)   SpO2 97%   BMI 34.45 kg/m?  ? ?General:  alert, cooperative, and no distress  ?Incision well-healed.  30 cc seroma aspirated. ?   ? ?Assessment:  ? ? Doing well postoperatively.  ?  ?Plan:  ? ?May return to work without restrictions on 06/26/2021.  Follow-up here as needed. ?

## 2021-07-04 ENCOUNTER — Ambulatory Visit (INDEPENDENT_AMBULATORY_CARE_PROVIDER_SITE_OTHER): Payer: 59 | Admitting: General Surgery

## 2021-07-04 ENCOUNTER — Encounter: Payer: Self-pay | Admitting: General Surgery

## 2021-07-04 VITALS — BP 157/88 | HR 91 | Temp 97.8°F | Resp 16 | Ht 71.0 in | Wt 245.0 lb

## 2021-07-04 DIAGNOSIS — Z09 Encounter for follow-up examination after completed treatment for conditions other than malignant neoplasm: Secondary | ICD-10-CM

## 2021-07-04 NOTE — Progress Notes (Signed)
Subjective:  ?  ? Georgina Peer  ?Patient presents back for treatment of his seroma, status post right inguinal herniorrhaphy with mesh. ?Objective:  ? ? BP (!) 157/88   Pulse 91   Temp 97.8 ?F (36.6 ?C) (Oral)   Resp 16   Ht 5\' 11"  (1.803 m)   Wt 245 lb (111.1 kg)   SpO2 94%   BMI 34.17 kg/m?  ? ?General:  alert, cooperative, and no distress  ?Right inguinal incision well-healed.  35 cc seroma aspirated. ?   ? ?Assessment:  ? ? Postoperative course complicated by recurrent seroma  ?  ?Plan:  ? ?I told the patient that he can continue to work as the seroma is decreasing in size, but does not seem to be affected by his work.  I will provide a note to his employer stating that he may need to return every other week for seroma aspiration.  He will also get an abdominal truss to see if that decreases the seroma recurrence.  Follow-up as needed. ?

## 2021-08-19 IMAGING — DX DG CHEST 2V
2 series · 2 of 2 positions shown · non-contrast
Comparison: None.

CLINICAL DATA: Cough, dyspnea

EXAM:
CHEST - 2 VIEW

[chest pa]
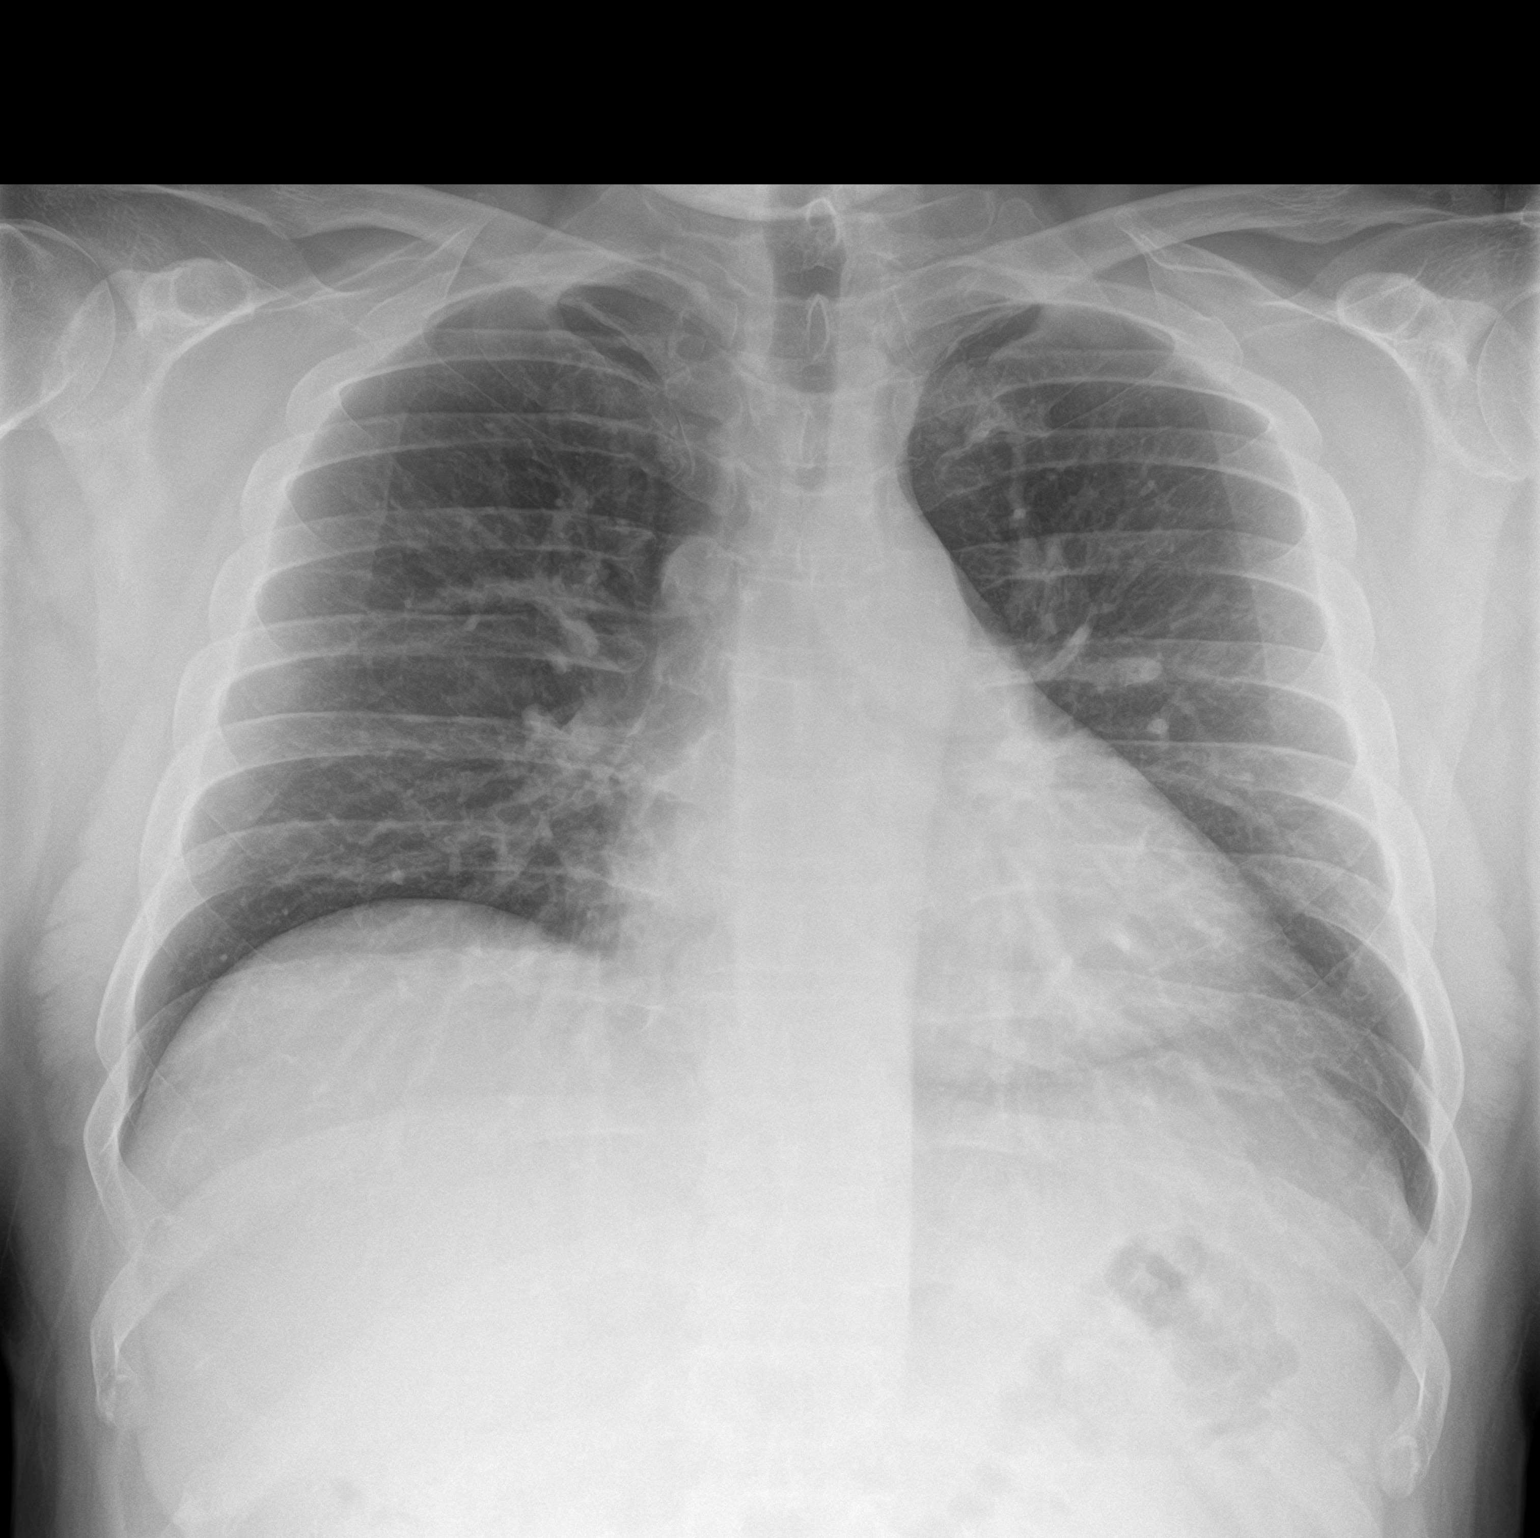

[chest lat]
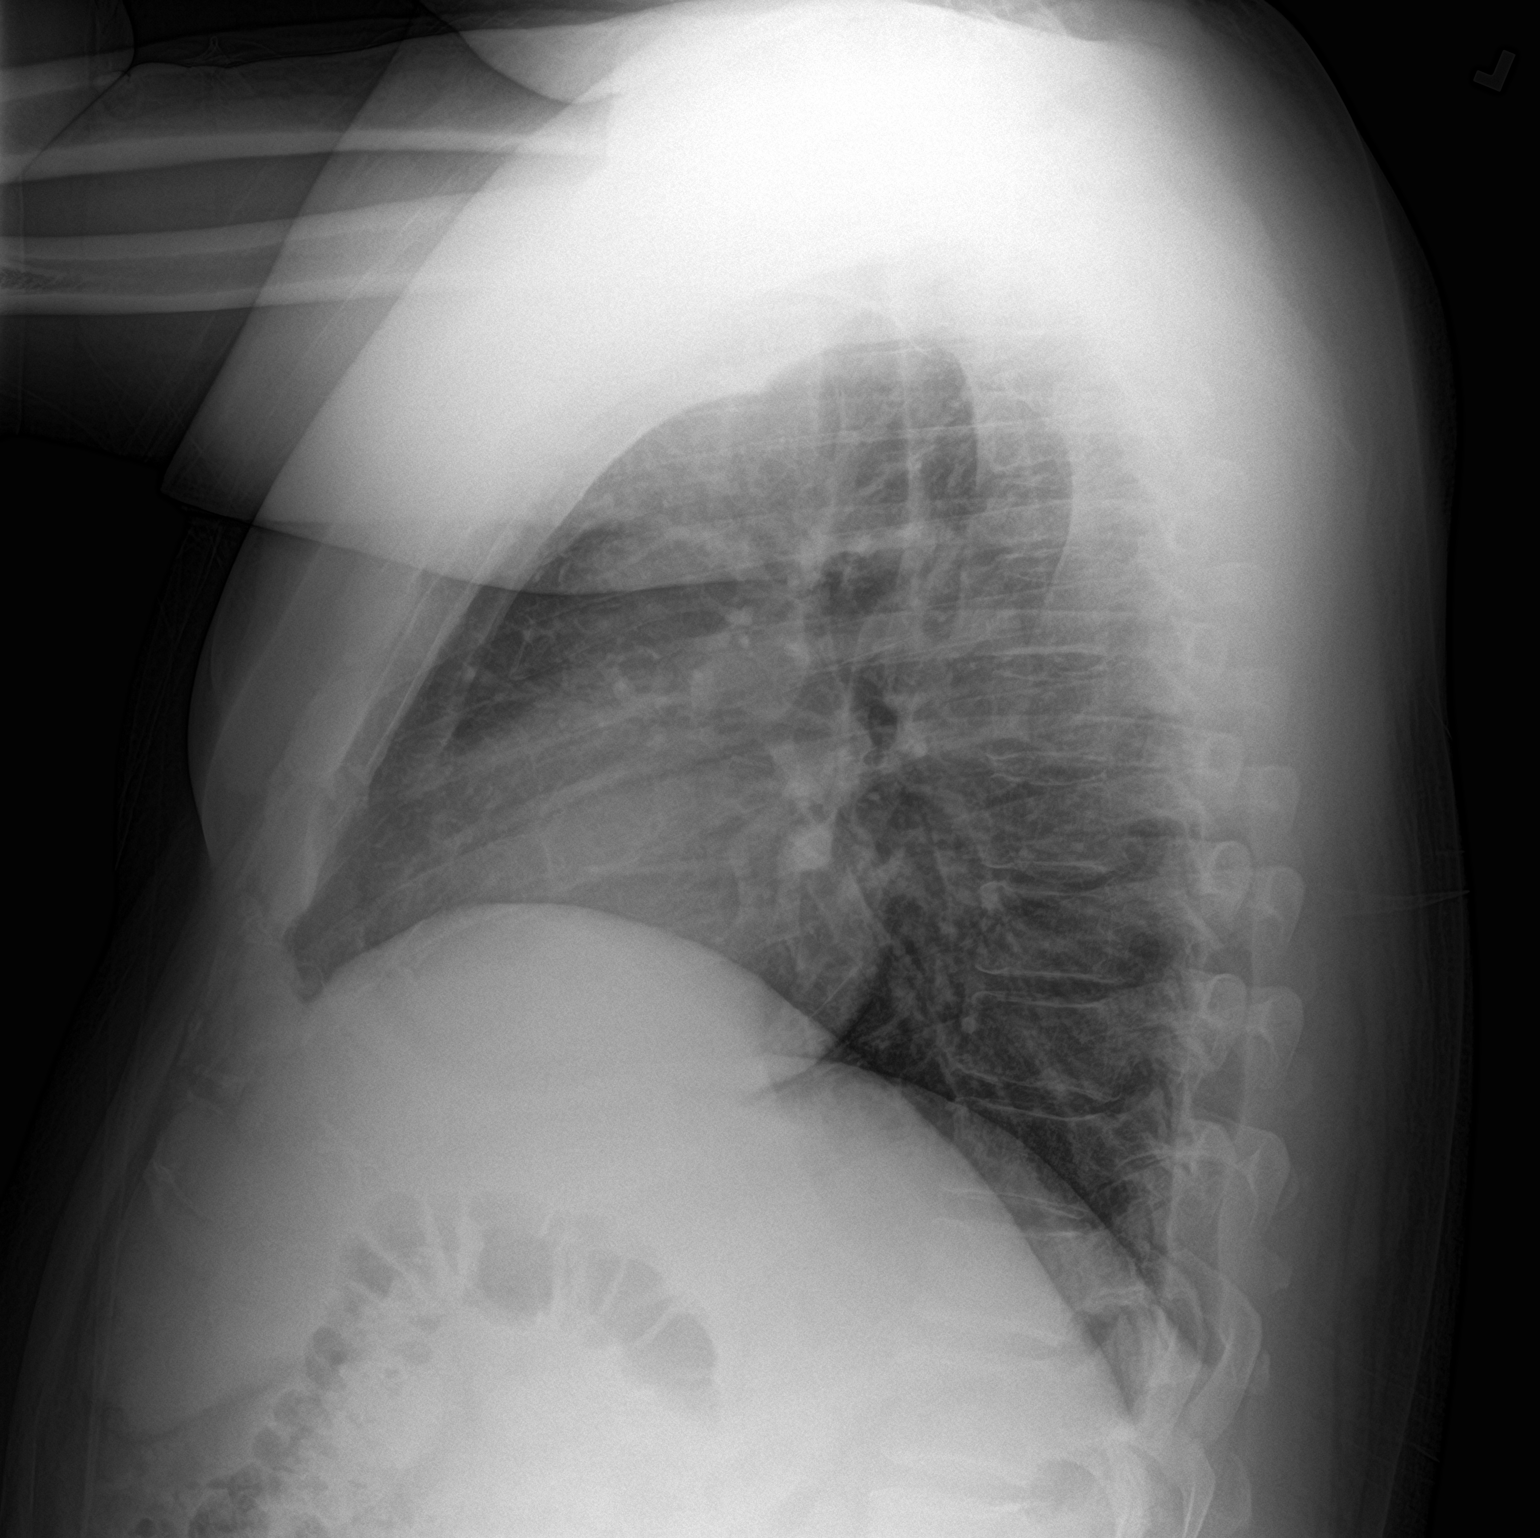

[2 of 2 positions shown; findings below may reference images not displayed]

FINDINGS: The heart size and mediastinal contours are within normal limits.
Both lungs are clear. The visualized skeletal structures are
unremarkable.
IMPRESSION: No active cardiopulmonary disease.

## 2021-08-20 ENCOUNTER — Encounter (HOSPITAL_BASED_OUTPATIENT_CLINIC_OR_DEPARTMENT_OTHER): Payer: Self-pay | Admitting: Emergency Medicine

## 2021-08-20 ENCOUNTER — Emergency Department (HOSPITAL_BASED_OUTPATIENT_CLINIC_OR_DEPARTMENT_OTHER): Payer: 59 | Admitting: Radiology

## 2021-08-20 ENCOUNTER — Emergency Department (HOSPITAL_BASED_OUTPATIENT_CLINIC_OR_DEPARTMENT_OTHER)
Admission: EM | Admit: 2021-08-20 | Discharge: 2021-08-20 | Disposition: A | Discharge status: Home / Self Care | Payer: 59 | Attending: Emergency Medicine | Admitting: Emergency Medicine

## 2021-08-20 ENCOUNTER — Other Ambulatory Visit: Payer: Self-pay

## 2021-08-20 DIAGNOSIS — M25511 Pain in right shoulder: Secondary | ICD-10-CM | POA: Insufficient documentation

## 2021-08-20 MED ORDER — NAPROXEN 500 MG PO TABS: 500.0000 mg | ORAL_TABLET | Freq: Two times a day (BID) | ORAL | 0 refills | Status: DC

## 2021-08-20 NOTE — ED Triage Notes (Signed)
Right shoulder pain unknown etiology for about 5 days, getting worse.

## 2021-10-06 ENCOUNTER — Other Ambulatory Visit: Payer: Self-pay | Admitting: Emergency Medicine

## 2021-10-06 DIAGNOSIS — R42 Dizziness and giddiness: Secondary | ICD-10-CM

## 2021-10-06 DIAGNOSIS — E7220 Disorder of urea cycle metabolism, unspecified: Secondary | ICD-10-CM

## 2021-10-23 ENCOUNTER — Ambulatory Visit: Payer: Medicaid Other

## 2021-10-23 DIAGNOSIS — R42 Dizziness and giddiness: Secondary | ICD-10-CM

## 2021-10-23 DIAGNOSIS — E7220 Disorder of urea cycle metabolism, unspecified: Secondary | ICD-10-CM

## 2021-10-23 MED ORDER — GADOBUTROL 1 MMOL/ML IV SOLN
10.0000 mL | Freq: Once | INTRAVENOUS | Status: AC | PRN
  Administered 2021-10-23: 10 mL via INTRAVENOUS

## 2021-12-16 ENCOUNTER — Emergency Department (HOSPITAL_COMMUNITY): Payer: Self-pay

## 2021-12-16 ENCOUNTER — Emergency Department (HOSPITAL_COMMUNITY)
Admission: EM | Admit: 2021-12-16 | Discharge: 2021-12-16 | Payer: Self-pay | Attending: Emergency Medicine | Admitting: Emergency Medicine

## 2021-12-16 ENCOUNTER — Encounter (HOSPITAL_COMMUNITY): Payer: Self-pay | Admitting: Surgery

## 2021-12-16 DIAGNOSIS — Z23 Encounter for immunization: Secondary | ICD-10-CM | POA: Insufficient documentation

## 2021-12-16 DIAGNOSIS — R519 Headache, unspecified: Secondary | ICD-10-CM | POA: Insufficient documentation

## 2021-12-16 DIAGNOSIS — S0180XA Unspecified open wound of other part of head, initial encounter: Secondary | ICD-10-CM | POA: Insufficient documentation

## 2021-12-16 DIAGNOSIS — W3400XA Accidental discharge from unspecified firearms or gun, initial encounter: Secondary | ICD-10-CM | POA: Insufficient documentation

## 2021-12-16 LAB — URINALYSIS, ROUTINE W REFLEX MICROSCOPIC
Bacteria, UA: NONE SEEN
Bilirubin Urine: NEGATIVE
Glucose, UA: NEGATIVE mg/dL
Hgb urine dipstick: NEGATIVE
Ketones, ur: NEGATIVE mg/dL
Leukocytes,Ua: NEGATIVE
Nitrite: NEGATIVE
Protein, ur: NEGATIVE mg/dL
Specific Gravity, Urine: 1.021 (ref 1.005–1.030)
pH: 5 (ref 5.0–8.0)

## 2021-12-16 LAB — COMPREHENSIVE METABOLIC PANEL
ALT: 57 U/L — ABNORMAL HIGH (ref 0–44)
AST: 59 U/L — ABNORMAL HIGH (ref 15–41)
Albumin: 3.7 g/dL (ref 3.5–5.0)
Alkaline Phosphatase: 78 U/L (ref 38–126)
Anion gap: 11 (ref 5–15)
BUN: 11 mg/dL (ref 6–20)
CO2: 23 mmol/L (ref 22–32)
Calcium: 8.7 mg/dL — ABNORMAL LOW (ref 8.9–10.3)
Chloride: 108 mmol/L (ref 98–111)
Creatinine, Ser: 0.98 mg/dL (ref 0.61–1.24)
GFR, Estimated: >60 mL/min (ref >60)
Glucose, Bld: 131 mg/dL — ABNORMAL HIGH (ref 70–99)
Potassium: 3.9 mmol/L (ref 3.5–5.1)
Sodium: 142 mmol/L (ref 135–145)
Total Bilirubin: 0.6 mg/dL (ref 0.3–1.2)
Total Protein: 7.2 g/dL (ref 6.5–8.1)

## 2021-12-16 LAB — ABO/RH: ABO/RH(D): A NEG

## 2021-12-16 LAB — SAMPLE TO BLOOD BANK

## 2021-12-16 LAB — I-STAT CHEM 8, ED
BUN: 12 mg/dL (ref 6–20)
Calcium, Ion: 1.09 mmol/L — ABNORMAL LOW (ref 1.15–1.40)
Chloride: 105 mmol/L (ref 98–111)
Creatinine, Ser: 1.3 mg/dL — ABNORMAL HIGH (ref 0.61–1.24)
Glucose, Bld: 124 mg/dL — ABNORMAL HIGH (ref 70–99)
HCT: 47 % (ref 39.0–52.0)
Hemoglobin: 16 g/dL (ref 13.0–17.0)
Potassium: 4 mmol/L (ref 3.5–5.1)
Sodium: 143 mmol/L (ref 135–145)
TCO2: 27 mmol/L (ref 22–32)

## 2021-12-16 LAB — I-STAT ARTERIAL BLOOD GAS, ED
Acid-base deficit: 4 mmol/L — ABNORMAL HIGH (ref 0.0–2.0)
Bicarbonate: 23 mmol/L (ref 20.0–28.0)
Calcium, Ion: 1.13 mmol/L — ABNORMAL LOW (ref 1.15–1.40)
HCT: 42 % (ref 39.0–52.0)
Hemoglobin: 14.3 g/dL (ref 13.0–17.0)
O2 Saturation: 100 %
Patient temperature: 36.4
Potassium: 4 mmol/L (ref 3.5–5.1)
Sodium: 140 mmol/L (ref 135–145)
TCO2: 25 mmol/L (ref 22–32)
pCO2 arterial: 49 mmHg — ABNORMAL HIGH (ref 32–48)
pH, Arterial: 7.278 — ABNORMAL LOW (ref 7.35–7.45)
pO2, Arterial: 291 mmHg — ABNORMAL HIGH (ref 83–108)

## 2021-12-16 LAB — CBC
HCT: 45.3 % (ref 39.0–52.0)
Hemoglobin: 15.7 g/dL (ref 13.0–17.0)
MCH: 34.1 pg — ABNORMAL HIGH (ref 26.0–34.0)
MCHC: 34.7 g/dL (ref 30.0–36.0)
MCV: 98.3 fL (ref 80.0–100.0)
Platelets: 192 10*3/uL (ref 150–400)
RBC: 4.61 MIL/uL (ref 4.22–5.81)
RDW: 13.2 % (ref 11.5–15.5)
WBC: 13 10*3/uL — ABNORMAL HIGH (ref 4.0–10.5)
nRBC: 0 % (ref 0.0–0.2)

## 2021-12-16 LAB — PROTIME-INR
INR: 1.1 (ref 0.8–1.2)
Prothrombin Time: 14.5 seconds (ref 11.4–15.2)

## 2021-12-16 LAB — LACTIC ACID, PLASMA: Lactic Acid, Venous: 1.7 mmol/L (ref 0.5–1.9)

## 2021-12-16 LAB — TYPE AND SCREEN
ABO/RH(D): A NEG
Antibody Screen: NEGATIVE

## 2021-12-16 LAB — ETHANOL: Alcohol, Ethyl (B): 243 mg/dL — ABNORMAL HIGH (ref <10)

## 2021-12-16 MED ORDER — PROPOFOL 1000 MG/100ML IV EMUL: 5.0000 ug/kg/min | INTRAVENOUS | Status: DC

## 2021-12-16 MED ORDER — ETOMIDATE 2 MG/ML IV SOLN
INTRAVENOUS | Status: AC | PRN
  Administered 2021-12-16: 20 mg via INTRAVENOUS

## 2021-12-16 MED ORDER — SODIUM CHLORIDE 0.9 % IV BOLUS
1000.0000 mL | Freq: Once | INTRAVENOUS | Status: AC
  Administered 2021-12-16: 1000 mL via INTRAVENOUS

## 2021-12-16 MED ORDER — ROCURONIUM BROMIDE 50 MG/5ML IV SOLN
INTRAVENOUS | Status: AC | PRN
  Administered 2021-12-16: 100 mg via INTRAVENOUS

## 2021-12-16 MED ORDER — IOHEXOL 350 MG/ML SOLN
75.0000 mL | Freq: Once | INTRAVENOUS | Status: AC | PRN
  Administered 2021-12-16: 75 mL via INTRAVENOUS

## 2021-12-16 MED ORDER — FENTANYL BOLUS VIA INFUSION: 50.0000 ug | INTRAVENOUS | Status: DC | PRN

## 2021-12-16 MED ORDER — PROPOFOL 1000 MG/100ML IV EMUL
INTRAVENOUS | Status: AC
  Administered 2021-12-16: 20 ug/kg/min via INTRAVENOUS
  Filled 2021-12-16: qty 100

## 2021-12-16 MED ORDER — FENTANYL CITRATE PF 50 MCG/ML IJ SOSY
50.0000 ug | PREFILLED_SYRINGE | Freq: Once | INTRAMUSCULAR | Status: AC
  Administered 2021-12-16: 50 ug via INTRAVENOUS

## 2021-12-16 MED ORDER — FENTANYL 2500MCG IN NS 250ML (10MCG/ML) PREMIX INFUSION
50.0000 ug/h | INTRAVENOUS | Status: DC
  Administered 2021-12-16: 100 ug/h via INTRAVENOUS
  Filled 2021-12-16: qty 250

## 2021-12-16 MED ORDER — CEFAZOLIN SODIUM-DEXTROSE 2-4 GM/100ML-% IV SOLN
2.0000 g | Freq: Once | INTRAVENOUS | Status: AC
  Administered 2021-12-16: 2 g via INTRAVENOUS
  Filled 2021-12-16: qty 100

## 2021-12-16 MED ORDER — TETANUS-DIPHTH-ACELL PERTUSSIS 5-2.5-18.5 LF-MCG/0.5 IM SUSY
0.5000 mL | PREFILLED_SYRINGE | Freq: Once | INTRAMUSCULAR | Status: AC
  Administered 2021-12-16: 0.5 mL via INTRAMUSCULAR
  Filled 2021-12-16: qty 0.5
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
## 2021-12-16 NOTE — ED Notes (Signed)
Pt has been transferred via [facility] to [facility]

## 2021-12-16 NOTE — ED Notes (Signed)
Pt transported to CT by this RN, Adella Hare RN, respiratory tech RN and Dr. Zenia Resides.

## 2021-12-16 NOTE — Progress Notes (Signed)
Pt transported from ED trama A to CT and back without any complications. RNx2 at bedside.
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
## 2021-12-16 NOTE — ED Notes (Signed)
Pt intubated 7.5 ETT, 25 @ teeth
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
## 2021-12-18 ENCOUNTER — Encounter (HOSPITAL_BASED_OUTPATIENT_CLINIC_OR_DEPARTMENT_OTHER): Payer: Self-pay | Admitting: Emergency Medicine

## 2022-05-17 ENCOUNTER — Other Ambulatory Visit: Payer: Self-pay

## 2022-05-17 ENCOUNTER — Encounter (HOSPITAL_COMMUNITY): Payer: Self-pay

## 2022-05-17 ENCOUNTER — Emergency Department (HOSPITAL_COMMUNITY)
Admission: EM | Admit: 2022-05-17 | Discharge: 2022-05-18 | Discharge status: Against Medical Advice | Payer: BC Managed Care – PPO | Attending: Emergency Medicine | Admitting: Emergency Medicine

## 2022-05-17 DIAGNOSIS — M62838 Other muscle spasm: Secondary | ICD-10-CM | POA: Insufficient documentation

## 2022-05-17 DIAGNOSIS — Z5321 Procedure and treatment not carried out due to patient leaving prior to being seen by health care provider: Secondary | ICD-10-CM | POA: Diagnosis not present
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
## 2022-05-31 ENCOUNTER — Ambulatory Visit (HOSPITAL_COMMUNITY): Payer: BC Managed Care – PPO | Admitting: Physical Therapy
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
## 2022-06-05 ENCOUNTER — Ambulatory Visit (HOSPITAL_COMMUNITY): Payer: BC Managed Care – PPO | Admitting: Physical Therapy

## 2022-06-05 ENCOUNTER — Encounter (HOSPITAL_COMMUNITY): Payer: Self-pay

## 2022-07-02 ENCOUNTER — Encounter (HOSPITAL_COMMUNITY): Payer: Self-pay

## 2022-07-02 ENCOUNTER — Ambulatory Visit (HOSPITAL_COMMUNITY): Payer: BC Managed Care – PPO | Admitting: Physical Therapy

## 2022-07-29 ENCOUNTER — Other Ambulatory Visit: Payer: Self-pay

## 2022-07-29 ENCOUNTER — Emergency Department (HOSPITAL_COMMUNITY)
Admission: EM | Admit: 2022-07-29 | Discharge: 2022-07-30 | Disposition: A | Discharge status: Home / Self Care | Payer: BC Managed Care – PPO | Attending: Emergency Medicine | Admitting: Emergency Medicine

## 2022-07-29 ENCOUNTER — Encounter (HOSPITAL_COMMUNITY): Payer: Self-pay

## 2022-07-29 DIAGNOSIS — R531 Weakness: Secondary | ICD-10-CM | POA: Insufficient documentation

## 2022-07-29 DIAGNOSIS — R4182 Altered mental status, unspecified: Secondary | ICD-10-CM | POA: Diagnosis not present

## 2022-07-29 DIAGNOSIS — R5383 Other fatigue: Secondary | ICD-10-CM | POA: Diagnosis not present

## 2022-07-29 DIAGNOSIS — Y908 Blood alcohol level of 240 mg/100 ml or more: Secondary | ICD-10-CM | POA: Diagnosis not present

## 2022-07-29 DIAGNOSIS — R791 Abnormal coagulation profile: Secondary | ICD-10-CM | POA: Insufficient documentation

## 2022-07-29 LAB — CBC
HCT: 29.4 % — ABNORMAL LOW (ref 39.0–52.0)
Hemoglobin: 9.4 g/dL — ABNORMAL LOW (ref 13.0–17.0)
MCH: 28.7 pg (ref 26.0–34.0)
MCHC: 32 g/dL (ref 30.0–36.0)
MCV: 89.6 fL (ref 80.0–100.0)
Platelets: 237 10*3/uL (ref 150–400)
RBC: 3.28 MIL/uL — ABNORMAL LOW (ref 4.22–5.81)
RDW: 17 % — ABNORMAL HIGH (ref 11.5–15.5)
WBC: 8.5 10*3/uL (ref 4.0–10.5)
nRBC: 0 % (ref 0.0–0.2)

## 2022-07-29 LAB — COMPREHENSIVE METABOLIC PANEL
ALT: 51 U/L — ABNORMAL HIGH (ref 0–44)
AST: 107 U/L — ABNORMAL HIGH (ref 15–41)
Albumin: 2.8 g/dL — ABNORMAL LOW (ref 3.5–5.0)
Alkaline Phosphatase: 134 U/L — ABNORMAL HIGH (ref 38–126)
Anion gap: 8 (ref 5–15)
BUN: 7 mg/dL (ref 6–20)
CO2: 25 mmol/L (ref 22–32)
Calcium: 8 mg/dL — ABNORMAL LOW (ref 8.9–10.3)
Chloride: 107 mmol/L (ref 98–111)
Creatinine, Ser: 0.72 mg/dL (ref 0.61–1.24)
GFR, Estimated: >60 mL/min (ref >60)
Glucose, Bld: 86 mg/dL (ref 70–99)
Potassium: 3.9 mmol/L (ref 3.5–5.1)
Sodium: 140 mmol/L (ref 135–145)
Total Bilirubin: 1.2 mg/dL (ref 0.3–1.2)
Total Protein: 8.2 g/dL — ABNORMAL HIGH (ref 6.5–8.1)

## 2022-07-29 LAB — DIFFERENTIAL
Abs Immature Granulocytes: 0.03 10*3/uL (ref 0.00–0.07)
Basophils Absolute: 0.2 10*3/uL — ABNORMAL HIGH (ref 0.0–0.1)
Basophils Relative: 2 %
Eosinophils Absolute: 0.2 10*3/uL (ref 0.0–0.5)
Eosinophils Relative: 3 %
Immature Granulocytes: 0 %
Lymphocytes Relative: 44 %
Lymphs Abs: 3.8 10*3/uL (ref 0.7–4.0)
Monocytes Absolute: 1.2 10*3/uL — ABNORMAL HIGH (ref 0.1–1.0)
Monocytes Relative: 14 %
Neutro Abs: 3.1 10*3/uL (ref 1.7–7.7)
Neutrophils Relative %: 37 %

## 2022-07-29 LAB — CBG MONITORING, ED: Glucose-Capillary: 83 mg/dL (ref 70–99)

## 2022-07-29 LAB — URINALYSIS, ROUTINE W REFLEX MICROSCOPIC
Bilirubin Urine: NEGATIVE
Glucose, UA: NEGATIVE mg/dL
Hgb urine dipstick: NEGATIVE
Ketones, ur: NEGATIVE mg/dL
Leukocytes,Ua: NEGATIVE
Nitrite: NEGATIVE
Protein, ur: NEGATIVE mg/dL
Specific Gravity, Urine: 1.002 — ABNORMAL LOW (ref 1.005–1.030)
pH: 5 (ref 5.0–8.0)

## 2022-07-29 LAB — APTT: aPTT: 34 seconds (ref 24–36)

## 2022-07-29 LAB — MAGNESIUM: Magnesium: 2.1 mg/dL (ref 1.7–2.4)

## 2022-07-29 LAB — RAPID URINE DRUG SCREEN, HOSP PERFORMED
Amphetamines: NOT DETECTED
Barbiturates: NOT DETECTED
Benzodiazepines: NOT DETECTED
Cocaine: NOT DETECTED
Opiates: NOT DETECTED
Tetrahydrocannabinol: NOT DETECTED

## 2022-07-29 LAB — PROTIME-INR
INR: 1.3 — ABNORMAL HIGH (ref 0.8–1.2)
Prothrombin Time: 16.6 seconds — ABNORMAL HIGH (ref 11.4–15.2)

## 2022-07-29 LAB — ETHANOL: Alcohol, Ethyl (B): 300 mg/dL — ABNORMAL HIGH (ref <10)

## 2022-07-29 NOTE — ED Notes (Signed)
Family updated as to patient's status.

## 2022-07-29 NOTE — ED Notes (Signed)
Pt takes 600mg  TID of Neurontin since Feb daily.  No other new medications.
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
## 2022-07-29 NOTE — ED Notes (Signed)
Pt repeatedly asking the same questions such as "will you transport me to [facility]" and "I need to just go to where they did all my surgeries".   Pt nodding out mid sentence, incoherent at times.   MD notified.

## 2022-07-29 NOTE — ED Notes (Signed)
ED Provider at bedside. 
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
## 2022-07-29 NOTE — ED Notes (Signed)
Charge RN at bedside. 

## 2022-11-20 ENCOUNTER — Ambulatory Visit: Payer: BC Managed Care – PPO | Admitting: Family Medicine

## 2022-12-13 ENCOUNTER — Encounter: Payer: Self-pay | Admitting: Family Medicine

## 2022-12-13 ENCOUNTER — Ambulatory Visit (INDEPENDENT_AMBULATORY_CARE_PROVIDER_SITE_OTHER): Payer: Medicaid Other | Admitting: Family Medicine

## 2022-12-13 VITALS — BP 137/83 | HR 104 | Ht 70.0 in | Wt 211.0 lb

## 2022-12-13 DIAGNOSIS — E038 Other specified hypothyroidism: Secondary | ICD-10-CM

## 2022-12-13 DIAGNOSIS — Z114 Encounter for screening for human immunodeficiency virus [HIV]: Secondary | ICD-10-CM

## 2022-12-13 DIAGNOSIS — F32A Depression, unspecified: Secondary | ICD-10-CM

## 2022-12-13 DIAGNOSIS — Z1159 Encounter for screening for other viral diseases: Secondary | ICD-10-CM

## 2022-12-13 DIAGNOSIS — R7301 Impaired fasting glucose: Secondary | ICD-10-CM

## 2022-12-13 DIAGNOSIS — Z1211 Encounter for screening for malignant neoplasm of colon: Secondary | ICD-10-CM

## 2022-12-13 DIAGNOSIS — E669 Obesity, unspecified: Secondary | ICD-10-CM | POA: Insufficient documentation

## 2022-12-13 DIAGNOSIS — E559 Vitamin D deficiency, unspecified: Secondary | ICD-10-CM | POA: Diagnosis not present

## 2022-12-13 DIAGNOSIS — R21 Rash and other nonspecific skin eruption: Secondary | ICD-10-CM | POA: Diagnosis not present

## 2022-12-13 DIAGNOSIS — F419 Anxiety disorder, unspecified: Secondary | ICD-10-CM | POA: Insufficient documentation

## 2022-12-13 DIAGNOSIS — Z136 Encounter for screening for cardiovascular disorders: Secondary | ICD-10-CM

## 2022-12-13 MED ORDER — MIRTAZAPINE 15 MG PO TABS: 15.0000 mg | ORAL_TABLET | Freq: Every day | ORAL | 2 refills | Status: DC

## 2022-12-13 MED ORDER — CYCLOBENZAPRINE HCL 5 MG PO TABS: 5.0000 mg | ORAL_TABLET | Freq: Once | ORAL | 2 refills | Status: DC | PRN

## 2022-12-13 MED ORDER — MUPIROCIN 2 % EX OINT: 1.0000 | TOPICAL_OINTMENT | Freq: Two times a day (BID) | CUTANEOUS | 1 refills | Status: DC

## 2022-12-13 NOTE — Patient Instructions (Signed)

## 2022-12-13 NOTE — Progress Notes (Addendum)
New Patient Office Visit   Subjective   Patient ID: Evan Wilson, male    DOB: [birth year]  Age: 47 y.o. MRN: [id number]  CC:  Chief Complaint  Patient presents with   Establish Care    Numbness and pain from reconstructive surgery. Possible singles on back. Surgeon wants primary to take over script of gabapentin and flexeril.     HPI Evan Wilson 47 year old male, presents to establish care. He  has a past medical history of Anemia, Arthritis, Arthritis, Arthritis, and Hyperlipidemia.For the details of today's visit, please refer to assessment and plan.   HPI   Flowsheet Row Office Visit from 12/13/2022 in [facility]  PHQ-9 Total Score 10       Outpatient Encounter Medications as of 12/13/2022  Medication Sig   acetaminophen (TYLENOL) 325 MG tablet Take 325 mg by mouth every 6 (six) hours as needed.   Amino Acids-Protein Hydrolys (PRO-STAT) LIQD Place 15 each inside cheek 3 (three) times daily.   gabapentin (NEURONTIN) 300 MG capsule Take 600 mg by mouth 3 (three) times daily.   Multiple Vitamin (MULTIVITAMIN) tablet Take 1 tablet by mouth daily.   mupirocin ointment (BACTROBAN) 2 % Apply 1 Application topically 2 (two) times daily. For 7-10 days   naproxen (NAPROSYN) 500 MG tablet Take 1 tablet (500 mg total) by mouth 2 (two) times daily.   tiZANidine (ZANAFLEX) 4 MG tablet Take 4 mg by mouth 2 (two) times daily.   [DISCONTINUED] cyclobenzaprine (FLEXERIL) 5 MG tablet Take 5 mg by mouth 3 (three) times daily as needed for muscle spasms.   [DISCONTINUED] mirtazapine (REMERON) 15 MG tablet Take 15 mg by mouth at bedtime.   cyclobenzaprine (FLEXERIL) 5 MG tablet Take 1 tablet (5 mg total) by mouth once as needed for up to 1 dose for muscle spasms.   mirtazapine (REMERON) 15 MG tablet Take 1 tablet (15 mg total) by mouth at bedtime.   No facility-administered encounter medications on file as of 12/13/2022.    Past Surgical History:  Procedure  Laterality Date   broken fingers     Broken thumb     INGUINAL HERNIA REPAIR Right 11/16/2019   Procedure: HERNIA REPAIR INGUINAL ADULT;  Surgeon: Franky Macho, MD;  Location: [facility];  Service: General;  Laterality: Right;   INGUINAL HERNIA REPAIR Right 05/03/2021   Procedure: RECURRENT HERNIA REPAIR INGUINAL ADULT;  Surgeon: Franky Macho, MD;  Location: [facility];  Service: General;  Laterality: Right;   INGUINAL HERNIA REPAIR Right    SPINE SURGERY      Review of Systems  Constitutional:  Positive for chills. Negative for fever.  Respiratory:  Negative for shortness of breath.   Cardiovascular:  Negative for chest pain.  Gastrointestinal:  Negative for abdominal pain.  Genitourinary:  Negative for dysuria.  Skin:  Positive for rash. Negative for itching.  Neurological:  Negative for dizziness and headaches.      Objective    BP 137/83   Pulse (!) 104   Ht 5\' 10"  (1.778 m)   Wt 211 lb 0.6 oz (95.7 kg)   SpO2 98%   BMI 30.28 kg/m   Physical Exam Vitals reviewed.  Constitutional:      General: He is not in acute distress.    Appearance: Normal appearance. He is not ill-appearing, toxic-appearing or diaphoretic.  HENT:     Head: Normocephalic.  Eyes:     General:  Right eye: No discharge.        Left eye: No discharge.     Conjunctiva/sclera: Conjunctivae normal.  Cardiovascular:     Rate and Rhythm: Normal rate.     Pulses: Normal pulses.     Heart sounds: Normal heart sounds.  Pulmonary:     Effort: Pulmonary effort is normal. No respiratory distress.     Breath sounds: Normal breath sounds.  Abdominal:     General: Bowel sounds are normal.     Palpations: Abdomen is soft.     Tenderness: There is no abdominal tenderness. There is no right CVA tenderness, left CVA tenderness or guarding.  Musculoskeletal:        General: Normal range of motion.     Cervical back: Normal range of motion.  Skin:    Capillary Refill: Capillary refill takes less than 2  seconds.     Comments: Upper right back: small, flat, erythematous rash. The rash is well-demarcated, non-blanching, and without scaling. No signs of infection or discharge noted  Neurological:     General: No focal deficit present.     Mental Status: He is alert and oriented to person, place, and time.     Coordination: Coordination normal.     Gait: Gait normal.  Psychiatric:        Mood and Affect: Mood normal.        Behavior: Behavior normal.       Assessment & Plan:  Encounter for screening for cardiovascular disorders -     Microalbumin / creatinine urine ratio -     Lipid panel -     CMP14+EGFR -     CBC with Differential/Platelet  IFG (impaired fasting glucose) -     Hemoglobin A1c  TSH (thyroid-stimulating hormone deficiency) -     TSH + free T4  Screening for HIV (human immunodeficiency virus) -     HIV Antibody (routine testing w rflx)  Vitamin D deficiency -     VITAMIN D 25 Hydroxy (Vit-D Deficiency, Fractures)  Need for hepatitis C screening test -     Hepatitis C antibody  Screening for colon cancer -     Ambulatory referral to Gastroenterology  Rash and nonspecific skin eruption Assessment & Plan: Bactroban ointment twice daily x 7 days Advise patient to apply cold , wet cloth or ice pack to the skin that itches, wear loose-fitting , cotton clothing, use fragrance-free lotions, soaps, and detergents to minimize irritation.    Obesity (BMI 30-39.9) Assessment & Plan: Labs ordered int today's visit A comprehensive physical examination was completed, and necessary labs were ordered. Screening and health maintenance recommendations have been updated. The patient received counseling on exercise and nutrition. BMI was assessed and discussed Advise for heart health, focus on: Eat more fruits and vegetables: Aim for a variety of colors. Choose whole grains: Brown rice, oats, and whole-wheat bread. Limit unhealthy fats: Avoid trans fats; use olive or avocado  oil instead. Include lean proteins: Opt for fish, chicken, beans, and legumes. Reduce sodium: Limit processed foods and add less salt. Stay hydrated: Drink plenty of water. Exercise regularly: Aim for at least 30 minutes of moderate exercise, like walking or cycling, 5 days a week.     Anxiety and depression Assessment & Plan: Flowsheet Row Office Visit from 12/13/2022 in [facility]  PHQ-9 Total Score 10          12/13/2022   11:18 AM  GAD 7 : Generalized  Anxiety Score  Nervous, Anxious, on Edge 2  Control/stop worrying 3  Worry too much - different things 1  Trouble relaxing 2  Restless 2  Easily annoyed or irritable 3  Afraid - awful might happen 3  Total GAD 7 Score 16  Anxiety Difficulty Somewhat difficult   On Remeron 15 mg bedtime- refilled medication today We discussed several non-pharmacological approaches to managing depression, including:  Establishing a consistent daily routine: This helps create structure and stability. Practicing mindfulness and relaxation techniques: Incorporating meditation, deep breathing exercises, or yoga to manage stress and improve emotional well-being. Engaging in regular physical activity: Aim for at least 30 minutes of exercise most days to boost mood and energy levels. Spending time outdoors: Exposure to natural light and fresh air can improve mental health. Building a support network: Encouraging social connections with friends, family, or support groups to reduce feelings of isolation. Prioritizing a balanced diet: Eating nutrient-rich foods while avoiding excessive amounts of processed foods, sugar, and unhealthy fats. Follow-up is recommended in 4-8 weeks to assess progress, with a referral to behavioral health for further support if needed.  Patient verbally consented to [facility] services about presenting concerns and psychiatric consultation as appropriate. The services will be  billed as appropriate for the patient.      Orders: -     Ambulatory referral to Behavioral Health  Other orders -     Cyclobenzaprine HCl; Take 1 tablet (5 mg total) by mouth once as needed for up to 1 dose for muscle spasms.  Dispense: 30 tablet; Refill: 2 -     Mirtazapine; Take 1 tablet (15 mg total) by mouth at bedtime.  Dispense: 30 tablet; Refill: 2 -     Mupirocin; Apply 1 Application topically 2 (two) times daily. For 7-10 days  Dispense: 30 g; Refill: 1    Return in about 4 months (around 04/15/2023), or if symptoms worsen or fail to improve, for chronic follow-up.   Cruzita Lederer Newman Nip, FNP

## 2022-12-13 NOTE — Assessment & Plan Note (Signed)
Labs ordered int today's visit A comprehensive physical examination was completed, and necessary labs were ordered. Screening and health maintenance recommendations have been updated. The patient received counseling on exercise and nutrition. BMI was assessed and discussed Advise for heart health, focus on: Eat more fruits and vegetables: Aim for a variety of colors. Choose whole grains: Brown rice, oats, and whole-wheat bread. Limit unhealthy fats: Avoid trans fats; use olive or avocado oil instead. Include lean proteins: Opt for fish, chicken, beans, and legumes. Reduce sodium: Limit processed foods and add less salt. Stay hydrated: Drink plenty of water. Exercise regularly: Aim for at least 30 minutes of moderate exercise, like walking or cycling, 5 days a week.

## 2022-12-13 NOTE — Assessment & Plan Note (Signed)
Bactroban ointment twice daily x 7 days Advise patient to apply cold , wet cloth or ice pack to the skin that itches, wear loose-fitting , cotton clothing, use fragrance-free lotions, soaps, and detergents to minimize irritation.

## 2022-12-13 NOTE — Assessment & Plan Note (Addendum)
Flowsheet Row Office Visit from 12/13/2022 in [facility] [place] [facility]  PHQ-9 Total Score 10          12/13/2022   11:18 AM  GAD 7 : Generalized Anxiety Score  Nervous, Anxious, on Edge 2  Control/stop worrying 3  Worry too much - different things 1  Trouble relaxing 2  Restless 2  Easily annoyed or irritable 3  Afraid - awful might happen 3  Total GAD 7 Score 16  Anxiety Difficulty Somewhat difficult   On Remeron 15 mg bedtime- refilled medication today We discussed several non-pharmacological approaches to managing depression, including:  Establishing a consistent daily routine: This helps create structure and stability. Practicing mindfulness and relaxation techniques: Incorporating meditation, deep breathing exercises, or yoga to manage stress and improve emotional well-being. Engaging in regular physical activity: Aim for at least 30 minutes of exercise most days to boost mood and energy levels. Spending time outdoors: Exposure to natural light and fresh air can improve mental health. Building a support network: Encouraging social connections with friends, family, or support groups to reduce feelings of isolation. Prioritizing a balanced diet: Eating nutrient-rich foods while avoiding excessive amounts of processed foods, sugar, and unhealthy fats. Follow-up is recommended in 4-8 weeks to assess progress, with a referral to behavioral health for further support if needed.  Patient verbally consented to [facility] services about presenting concerns and psychiatric consultation as appropriate. The services will be billed as appropriate for the patient.

## 2022-12-14 NOTE — Addendum Note (Signed)
Addended by: Rica Records on: 12/14/2022 12:13 PM   Modules accepted: Orders

## 2022-12-17 ENCOUNTER — Encounter (INDEPENDENT_AMBULATORY_CARE_PROVIDER_SITE_OTHER): Payer: Self-pay | Admitting: *Deleted

## 2022-12-19 LAB — CMP14+EGFR
ALT: 34 [IU]/L (ref 0–44)
AST: 68 [IU]/L — ABNORMAL HIGH (ref 0–40)
Albumin: 3.6 g/dL — ABNORMAL LOW (ref 4.1–5.1)
Alkaline Phosphatase: 129 [IU]/L — ABNORMAL HIGH (ref 44–121)
BUN/Creatinine Ratio: 8 — ABNORMAL LOW (ref 9–20)
BUN: 7 mg/dL (ref 6–24)
Bilirubin Total: 1.1 mg/dL (ref 0.0–1.2)
CO2: 23 mmol/L (ref 20–29)
Calcium: 8.6 mg/dL — ABNORMAL LOW (ref 8.7–10.2)
Chloride: 105 mmol/L (ref 96–106)
Creatinine, Ser: 0.84 mg/dL (ref 0.76–1.27)
Globulin, Total: 3.6 g/dL (ref 1.5–4.5)
Glucose: 84 mg/dL (ref 70–99)
Potassium: 4.3 mmol/L (ref 3.5–5.2)
Sodium: 141 mmol/L (ref 134–144)
Total Protein: 7.2 g/dL (ref 6.0–8.5)
eGFR: 110 mL/min/{1.73_m2} (ref >59)

## 2022-12-19 LAB — CBC WITH DIFFERENTIAL/PLATELET
Basophils Absolute: 0.1 10*3/uL (ref 0.0–0.2)
Basos: 2 %
EOS (ABSOLUTE): 0.1 10*3/uL (ref 0.0–0.4)
Eos: 2 %
Hematocrit: 33 % — ABNORMAL LOW (ref 37.5–51.0)
Hemoglobin: 10.4 g/dL — ABNORMAL LOW (ref 13.0–17.7)
Immature Grans (Abs): 0 10*3/uL (ref 0.0–0.1)
Immature Granulocytes: 0 %
Lymphocytes Absolute: 2.1 10*3/uL (ref 0.7–3.1)
Lymphs: 32 %
MCH: 26.5 pg — ABNORMAL LOW (ref 26.6–33.0)
MCHC: 31.5 g/dL (ref 31.5–35.7)
MCV: 84 fL (ref 79–97)
Monocytes Absolute: 0.9 10*3/uL (ref 0.1–0.9)
Monocytes: 14 %
Neutrophils Absolute: 3.2 10*3/uL (ref 1.4–7.0)
Neutrophils: 50 %
Platelets: 172 10*3/uL (ref 150–450)
RBC: 3.92 x10E6/uL — ABNORMAL LOW (ref 4.14–5.80)
RDW: 19.8 % — ABNORMAL HIGH (ref 11.6–15.4)
WBC: 6.5 10*3/uL (ref 3.4–10.8)

## 2022-12-19 LAB — HIV ANTIBODY (ROUTINE TESTING W REFLEX): HIV Screen 4th Generation wRfx: NONREACTIVE

## 2022-12-19 LAB — LIPID PANEL
Chol/HDL Ratio: 2.5 {ratio} (ref 0.0–5.0)
Cholesterol, Total: 162 mg/dL (ref 100–199)
HDL: 66 mg/dL (ref >39)
LDL Chol Calc (NIH): 81 mg/dL (ref 0–99)
Triglycerides: 77 mg/dL (ref 0–149)
VLDL Cholesterol Cal: 15 mg/dL (ref 5–40)

## 2022-12-19 LAB — MICROALBUMIN / CREATININE URINE RATIO
Creatinine, Urine: 225.9 mg/dL
Microalb/Creat Ratio: 4 mg/g{creat} (ref 0–29)
Microalbumin, Urine: 8.8 ug/mL

## 2022-12-19 LAB — TSH+FREE T4
Free T4: 0.96 ng/dL (ref 0.82–1.77)
TSH: 1.15 u[IU]/mL (ref 0.450–4.500)

## 2022-12-19 LAB — HEPATITIS C ANTIBODY: Hep C Virus Ab: NONREACTIVE

## 2022-12-19 LAB — VITAMIN D 25 HYDROXY (VIT D DEFICIENCY, FRACTURES): Vit D, 25-Hydroxy: 37.4 ng/mL (ref 30.0–100.0)

## 2022-12-19 LAB — HEMOGLOBIN A1C
Est. average glucose Bld gHb Est-mCnc: 97 mg/dL
Hgb A1c MFr Bld: 5 % (ref 4.8–5.6)

## 2022-12-21 ENCOUNTER — Ambulatory Visit: Payer: Medicaid Other | Admitting: Professional Counselor

## 2022-12-21 DIAGNOSIS — F4310 Post-traumatic stress disorder, unspecified: Secondary | ICD-10-CM
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
## 2022-12-21 NOTE — Patient Instructions (Signed)
If your symptoms worsen or you have thoughts of suicide/homicide, PLEASE SEEK IMMEDIATE MEDICAL ATTENTION.  You may always call:   National Suicide Hotline: 988 or 800-273-8255 [place] Crisis Line: 336-832-9700 Crisis Recovery in [place]: 800-939-5911      These are available 24 hours a day, 7 days a week.  

## 2022-12-26 ENCOUNTER — Telehealth (INDEPENDENT_AMBULATORY_CARE_PROVIDER_SITE_OTHER): Payer: Medicaid Other | Admitting: Professional Counselor

## 2022-12-26 DIAGNOSIS — F4310 Post-traumatic stress disorder, unspecified: Secondary | ICD-10-CM
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
## 2022-12-27 ENCOUNTER — Ambulatory Visit (INDEPENDENT_AMBULATORY_CARE_PROVIDER_SITE_OTHER): Payer: Medicaid Other | Admitting: Professional Counselor

## 2022-12-27 DIAGNOSIS — F4310 Post-traumatic stress disorder, unspecified: Secondary | ICD-10-CM | POA: Diagnosis not present
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
## 2022-12-27 NOTE — Patient Instructions (Addendum)
Please visit [facility] for psychiatric and therapeutic needs. [address] [place], [place] [address] [phone]  If your symptoms worsen or you have thoughts of suicide/homicide, PLEASE SEEK IMMEDIATE MEDICAL ATTENTION.  You may always call:   National Suicide Hotline: 988 or 276-697-8663 [place] Crisis Line: 249-086-1118 Crisis Recovery in [place]: [phone])368-2027     These are available 24 hours a day, 7 days a week.

## 2023-01-01 ENCOUNTER — Encounter: Payer: Self-pay | Admitting: Family Medicine

## 2023-01-02 NOTE — Telephone Encounter (Signed)
Please ask patient what would be he's medical condition for this request ?
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
## 2023-01-04 NOTE — Telephone Encounter (Signed)
Thank you Letter has been sent thru My Chart.

## 2023-02-08 ENCOUNTER — Institutional Professional Consult (permissible substitution): Payer: Self-pay | Admitting: Professional Counselor

## 2023-02-16 ENCOUNTER — Emergency Department (HOSPITAL_COMMUNITY)
Admission: EM | Admit: 2023-02-16 | Discharge: 2023-02-16 | Disposition: A | Discharge status: Home / Self Care | Payer: Medicaid Other | Attending: Emergency Medicine | Admitting: Emergency Medicine

## 2023-02-16 ENCOUNTER — Emergency Department (HOSPITAL_COMMUNITY): Payer: Medicaid Other

## 2023-02-16 DIAGNOSIS — M79671 Pain in right foot: Secondary | ICD-10-CM | POA: Insufficient documentation

## 2023-02-16 NOTE — Discharge Instructions (Addendum)
You were seen in the ER for evaluation of your foot pain. I would like for you to follow up with your sports medicine/orthopedic provider. I have listed your previous providers information for you in the discharge paperwork. For pain, you can take Tylenol or ibuprofen as needed for pain. I have included more information on the RICE method into the discharge paperwork. You can wear the CAM boot when up and walking, weight bear as tolerated. Use the crutches as needed. If you have any concerns, new or worsening symptoms, please return to the nearest ER for re-evaluation.   Contact a health care provider if: Your pain does not get better after a few days of treatment at home. Your pain gets worse. You cannot stand on your foot. Your foot or toes are swollen. Your foot is numb or tingling. Get help right away if: Your foot or toes turn white or blue. You have warmth and redness along your foot.

## 2023-02-16 NOTE — Progress Notes (Signed)
Orthopedic Tech Progress Note Patient Details:  CORNELIUS ZEITZ February 15, 1976 [id number]  Ortho Devices Type of Ortho Device: Crutches, CAM walker Ortho Device/Splint Location: RLE Ortho Device/Splint Interventions: Ordered, Application, Adjustment   Post Interventions Patient Tolerated: Well Instructions Provided: Adjustment of device, Poper ambulation with device  Tonye Pearson 02/16/2023, 1:58 PM

## 2023-02-16 NOTE — ED Provider Notes (Signed)
[facility] EMERGENC[facility]PARTMENT AT [facility] Provider Note   CSN: [id number] Arrival date & time: 02/16/23  1036     History  Chief Complaint  Patient presents with   Foot Pain    Evan Wilson is a 47 y.o. male presents to the ER for evaluation of right foot pain. The patient reports it has been present for the past few weeks, but worse over the past week. He denies any known injury. He does have a previous h/o fibular resection from reconstructive surgery to his jaw. He denies any new numbness or tingling. Reports worse with palpation and with walking. No swelling or color changes reported. No erythema or fevers. No pain medication taken during his symptom duration.    Foot Pain       Home Medications Prior to Admission medications   Medication Sig Start Date End Date Taking? Authorizing Provider  acetaminophen (TYLENOL) 325 MG tablet Take 325 mg by mouth every 6 (six) hours as needed. 12/29/21   [provider]  Amino Acids-Protein Hydrolys (PRO-STAT) LIQD Place 15 each inside cheek 3 (three) times daily. 03/30/22   [provider]  cyclobenzaprine (FLEXERIL) 5 MG tablet Take 1 tablet (5 mg total) by mouth once as needed for up to 1 dose for muscle spasms. 12/13/22   Del Nigel Berthold, FNP  gabapentin (NEURONTIN) 300 MG capsule Take 600 mg by mouth 3 (three) times daily.    [provider]  mirtazapine (REMERON) 15 MG tablet Take 1 tablet (15 mg total) by mouth at bedtime. 12/13/22   Del Nigel Berthold, FNP  Multiple Vitamin (MULTIVITAMIN) tablet Take 1 tablet by mouth daily.    [provider]  mupirocin ointment (BACTROBAN) 2 % Apply 1 Application topically 2 (two) times daily. For 7-10 days 12/13/22   Del Nigel Berthold, FNP  naproxen (NAPROSYN) 500 MG tablet Take 1 tablet (500 mg total) by mouth 2 (two) times daily. 08/20/21   Terrilee Files, MD  tiZANidine (ZANAFLEX) 4 MG tablet Take 4 mg by mouth 2 (two)  times daily. 10/27/19   [provider]      Allergies    Toradol [ketorolac tromethamine] and Tramadol    Review of Systems   Review of Systems  Constitutional:  Negative for chills and fever.  Musculoskeletal:  Positive for arthralgias. Negative for joint swelling.    Physical Exam Updated Vital Signs BP (!) 143/70 (BP Location: Right Arm)   Pulse 84   Temp 98 F (36.7 C) (Oral)   Resp 14   SpO2 98%  Physical Exam Vitals and nursing note reviewed.  Constitutional:      General: He is not in acute distress.    Appearance: He is not toxic-appearing.  Eyes:     General: No scleral icterus. Pulmonary:     Effort: Pulmonary effort is normal. No respiratory distress.  Musculoskeletal:     Right lower leg: No edema.       Feet:  Feet:     Comments: Tenderness to the marked area. No overlying skin changes. No swelling, induration, or fluctuance. Some weakness with dorsiflexion, which he reports is chronic since surgery. Strong planter flexion. Brisk cap refill on toes. Reports sensation is intact. Compartments are soft.  Skin:    General: Skin is warm and dry.  Neurological:     Mental Status: He is alert.     ED Results / Procedures / Treatments   Labs (all labs ordered  are listed, but only abnormal results are displayed) Labs Reviewed - No data to display  EKG None  Radiology DG Foot Complete Right Result Date: 02/16/2023 CLINICAL DATA:  Right foot pain for 1 week.  No known injury. EXAM: RIGHT FOOT COMPLETE - 3+ VIEW COMPARISON:  None Available. FINDINGS: The joint spaces are maintained. No acute bony findings or arthropathic changes. Evidence of prior fibular resection. Moderate-sized calcaneal spur at the Achilles attachment site. IMPRESSION: 1. No acute bony findings or significant arthropathic changes. 2. Prior fibular resection. Electronically Signed   By: Rudie Meyer M.D.   On: 02/16/2023 11:12    Procedures Procedures   Medications Ordered in  ED Medications - No data to display  ED Course/ Medical Decision Making/ A&P                               Medical Decision Making Amount and/or Complexity of Data Reviewed Radiology: ordered.   47 y.o. male presents to the ER for evaluation of right foot pain. Differential diagnosis includes but is not limited to fracture, dislocations, tendon/ligament injury, post-op pain. Vital signs unremarkable. Physical exam as noted above.   XR shows 1. No acute bony findings or significant arthropathic changes. 2. Prior fibular resection. Per radiologist's interpretation.    I called radiology given I see a lone ossicle seen on the oblique view.  Discussed with another radiologist about this.  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  Does not think this is any acute finding.  The patient had a fibular resection in late 2023, early 2024. I do not think this is an acute post-op problem. 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 May be from previous remote injury given that is where is is having tenderness. He is NV intact distally. Has some weakness with dorsifelxion, but that is at it's chronic state since his surgery. Will provide him CAM boot, crutches, and provided information on RICE. He has seen an orthopedist before with [facility], will provide that information. He is stable for discharge home with close follow up.   We discussed the results of the labs/imaging. The plan is Cam boot, RICE, follow up ortho. We discussed strict return precautions and red flag symptoms. The patient verbalized their understanding and agrees to the plan. The patient is stable and being discharged home in good condition.  Portions of this report may have been transcribed using voice recognition software. Every effort was made to ensure accuracy; however, inadvertent computerized transcription errors may be present.   Final Clinical  Impression(s) / ED Diagnoses Final diagnoses:  Right foot pain    Rx / DC Orders ED Discharge Orders     None         Achille Rich, PA-C 02/17/23 0715    Gloris Manchester, MD 02/17/23 [phone]

## 2023-02-16 NOTE — ED Triage Notes (Signed)
Pt reports R foot pain for one week. Denies known injury.

## 2023-02-28 ENCOUNTER — Other Ambulatory Visit: Payer: Self-pay

## 2023-02-28 ENCOUNTER — Ambulatory Visit (HOSPITAL_COMMUNITY)
Admission: EM | Admit: 2023-02-28 | Discharge: 2023-03-01 | Disposition: A | Discharge status: Other Acute Inpatient Hospital | Payer: Medicaid Other | Attending: Psychiatry | Admitting: Psychiatry

## 2023-02-28 DIAGNOSIS — F1010 Alcohol abuse, uncomplicated: Secondary | ICD-10-CM | POA: Diagnosis not present

## 2023-02-28 DIAGNOSIS — R21 Rash and other nonspecific skin eruption: Secondary | ICD-10-CM | POA: Diagnosis present

## 2023-02-28 DIAGNOSIS — F1020 Alcohol dependence, uncomplicated: Secondary | ICD-10-CM | POA: Diagnosis present

## 2023-02-28 DIAGNOSIS — Z79899 Other long term (current) drug therapy: Secondary | ICD-10-CM | POA: Insufficient documentation

## 2023-02-28 DIAGNOSIS — Z1152 Encounter for screening for COVID-19: Secondary | ICD-10-CM | POA: Insufficient documentation

## 2023-02-28 DIAGNOSIS — F331 Major depressive disorder, recurrent, moderate: Secondary | ICD-10-CM | POA: Diagnosis not present

## 2023-02-28 DIAGNOSIS — R4589 Other symptoms and signs involving emotional state: Secondary | ICD-10-CM

## 2023-02-28 DIAGNOSIS — F339 Major depressive disorder, recurrent, unspecified: Secondary | ICD-10-CM

## 2023-02-28 LAB — CBC WITH DIFFERENTIAL/PLATELET
Abs Immature Granulocytes: 0.04 10*3/uL (ref 0.00–0.07)
Basophils Absolute: 0.1 10*3/uL (ref 0.0–0.1)
Basophils Relative: 2 %
Eosinophils Absolute: 0.2 10*3/uL (ref 0.0–0.5)
Eosinophils Relative: 3 %
HCT: 34.9 % — ABNORMAL LOW (ref 39.0–52.0)
Hemoglobin: 11.2 g/dL — ABNORMAL LOW (ref 13.0–17.0)
Immature Granulocytes: 1 %
Lymphocytes Relative: 41 %
Lymphs Abs: 3 10*3/uL (ref 0.7–4.0)
MCH: 27.3 pg (ref 26.0–34.0)
MCHC: 32.1 g/dL (ref 30.0–36.0)
MCV: 85.1 fL (ref 80.0–100.0)
Monocytes Absolute: 1 10*3/uL (ref 0.1–1.0)
Monocytes Relative: 13 %
Neutro Abs: 3 10*3/uL (ref 1.7–7.7)
Neutrophils Relative %: 40 %
Platelets: 215 10*3/uL (ref 150–400)
RBC: 4.1 MIL/uL — ABNORMAL LOW (ref 4.22–5.81)
RDW: 20.1 % — ABNORMAL HIGH (ref 11.5–15.5)
WBC: 7.3 10*3/uL (ref 4.0–10.5)
nRBC: 0 % (ref 0.0–0.2)

## 2023-02-28 LAB — COMPREHENSIVE METABOLIC PANEL
ALT: 36 U/L (ref 0–44)
AST: 64 U/L — ABNORMAL HIGH (ref 15–41)
Albumin: 3.2 g/dL — ABNORMAL LOW (ref 3.5–5.0)
Alkaline Phosphatase: 98 U/L (ref 38–126)
Anion gap: 7 (ref 5–15)
BUN: <5 mg/dL — ABNORMAL LOW (ref 6–20)
CO2: 25 mmol/L (ref 22–32)
Calcium: 8.6 mg/dL — ABNORMAL LOW (ref 8.9–10.3)
Chloride: 110 mmol/L (ref 98–111)
Creatinine, Ser: 0.83 mg/dL (ref 0.61–1.24)
GFR, Estimated: >60 mL/min (ref >60)
Glucose, Bld: 94 mg/dL (ref 70–99)
Potassium: 3.9 mmol/L (ref 3.5–5.1)
Sodium: 142 mmol/L (ref 135–145)
Total Bilirubin: 0.7 mg/dL (ref 0.0–1.2)
Total Protein: 7.2 g/dL (ref 6.5–8.1)

## 2023-02-28 LAB — POCT URINE DRUG SCREEN - MANUAL ENTRY (I-SCREEN)
POC Amphetamine UR: NOT DETECTED
POC Buprenorphine (BUP): NOT DETECTED
POC Cocaine UR: NOT DETECTED
POC Marijuana UR: NOT DETECTED
POC Methadone UR: NOT DETECTED
POC Methamphetamine UR: NOT DETECTED
POC Morphine: NOT DETECTED
POC Oxazepam (BZO): NOT DETECTED
POC Oxycodone UR: NOT DETECTED
POC Secobarbital (BAR): NOT DETECTED

## 2023-02-28 LAB — TSH: TSH: 1.746 u[IU]/mL (ref 0.350–4.500)

## 2023-02-28 LAB — ETHANOL: Alcohol, Ethyl (B): 144 mg/dL — ABNORMAL HIGH (ref <10)

## 2023-02-28 MED ORDER — ALUM & MAG HYDROXIDE-SIMETH 200-200-20 MG/5ML PO SUSP: 30.0000 mL | ORAL | Status: DC | PRN

## 2023-02-28 MED ORDER — OLANZAPINE 10 MG PO TBDP
10.0000 mg | ORAL_TABLET | Freq: Three times a day (TID) | ORAL | Status: DC | PRN
  Administered 2023-02-28: 10 mg via ORAL
  Filled 2023-02-28: qty 1

## 2023-02-28 MED ORDER — LORAZEPAM 1 MG PO TABS
1.0000 mg | ORAL_TABLET | ORAL | Status: AC | PRN
  Administered 2023-02-28: 1 mg via ORAL
  Filled 2023-02-28: qty 1

## 2023-02-28 MED ORDER — ACETAMINOPHEN 325 MG PO TABS
650.0000 mg | ORAL_TABLET | Freq: Four times a day (QID) | ORAL | Status: DC | PRN
  Administered 2023-03-01: 650 mg via ORAL
  Filled 2023-02-28: qty 2

## 2023-02-28 MED ORDER — ZIPRASIDONE MESYLATE 20 MG IM SOLR: 20.0000 mg | INTRAMUSCULAR | Status: DC | PRN

## 2023-02-28 MED ORDER — MAGNESIUM HYDROXIDE 400 MG/5ML PO SUSP: 30.0000 mL | Freq: Every day | ORAL | Status: DC | PRN
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
## 2023-02-28 NOTE — ED Notes (Signed)
 Pt A&O x 4, cooperative and irritable, labile.  Presents with auditory & visual hallucinations, hearing voices telling  him to do things he reports, checking windows and doors at night thinking that someone is going to come and get him.  Comfort measures given.  Monitoring for safety.
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
## 2023-03-01 ENCOUNTER — Other Ambulatory Visit (HOSPITAL_COMMUNITY)
Admission: EM | Admit: 2023-03-01 | Discharge: 2023-03-07 | Disposition: A | Discharge status: Home / Self Care | Payer: Medicaid Other | Source: Home / Self Care

## 2023-03-01 ENCOUNTER — Encounter (HOSPITAL_COMMUNITY): Payer: Self-pay

## 2023-03-01 DIAGNOSIS — F1020 Alcohol dependence, uncomplicated: Secondary | ICD-10-CM | POA: Insufficient documentation

## 2023-03-01 DIAGNOSIS — Z9151 Personal history of suicidal behavior: Secondary | ICD-10-CM | POA: Insufficient documentation

## 2023-03-01 DIAGNOSIS — F1010 Alcohol abuse, uncomplicated: Secondary | ICD-10-CM | POA: Diagnosis present

## 2023-03-01 DIAGNOSIS — R251 Tremor, unspecified: Secondary | ICD-10-CM | POA: Insufficient documentation

## 2023-03-01 DIAGNOSIS — F29 Unspecified psychosis not due to a substance or known physiological condition: Secondary | ICD-10-CM

## 2023-03-01 DIAGNOSIS — Z1152 Encounter for screening for COVID-19: Secondary | ICD-10-CM | POA: Diagnosis not present

## 2023-03-01 DIAGNOSIS — F339 Major depressive disorder, recurrent, unspecified: Secondary | ICD-10-CM

## 2023-03-01 DIAGNOSIS — F331 Major depressive disorder, recurrent, moderate: Secondary | ICD-10-CM

## 2023-03-01 DIAGNOSIS — Z79899 Other long term (current) drug therapy: Secondary | ICD-10-CM | POA: Diagnosis not present

## 2023-03-01 DIAGNOSIS — F4310 Post-traumatic stress disorder, unspecified: Secondary | ICD-10-CM

## 2023-03-01 DIAGNOSIS — F419 Anxiety disorder, unspecified: Secondary | ICD-10-CM | POA: Insufficient documentation

## 2023-03-01 LAB — LIPID PANEL
Cholesterol: 185 mg/dL (ref 0–200)
HDL: 61 mg/dL (ref >40)
LDL Cholesterol: 108 mg/dL — ABNORMAL HIGH (ref 0–99)
Total CHOL/HDL Ratio: 3 {ratio}
Triglycerides: 78 mg/dL (ref <150)
VLDL: 16 mg/dL (ref 0–40)

## 2023-03-01 LAB — POC SARS CORONAVIRUS 2 AG (NURSE ORDER): SARS Coronavirus 2 Ag: NEGATIVE

## 2023-03-01 MED ORDER — LORAZEPAM 1 MG PO TABS: 1.0000 mg | ORAL_TABLET | Freq: Every day | ORAL | Status: DC

## 2023-03-01 MED ORDER — SERTRALINE HCL 50 MG PO TABS
50.0000 mg | ORAL_TABLET | Freq: Every day | ORAL | Status: DC
  Administered 2023-03-01 – 2023-03-07 (×7): 50 mg via ORAL
  Filled 2023-03-01 (×7): qty 1

## 2023-03-01 MED ORDER — THIAMINE MONONITRATE 100 MG PO TABS: 100.0000 mg | ORAL_TABLET | Freq: Every day | ORAL | Status: DC

## 2023-03-01 MED ORDER — HYDROXYZINE HCL 25 MG PO TABS
25.0000 mg | ORAL_TABLET | Freq: Four times a day (QID) | ORAL | Status: AC | PRN
  Administered 2023-03-02: 25 mg via ORAL
  Filled 2023-03-01: qty 1

## 2023-03-01 MED ORDER — LORAZEPAM 1 MG PO TABS
1.0000 mg | ORAL_TABLET | Freq: Four times a day (QID) | ORAL | Status: AC
  Administered 2023-03-01 – 2023-03-02 (×4): 1 mg via ORAL
  Filled 2023-03-01 (×4): qty 1

## 2023-03-01 MED ORDER — THIAMINE HCL 100 MG/ML IJ SOLN
100.0000 mg | Freq: Once | INTRAMUSCULAR | Status: AC
  Administered 2023-03-01: 100 mg via INTRAMUSCULAR
  Filled 2023-03-01: qty 2

## 2023-03-01 MED ORDER — LORAZEPAM 1 MG PO TABS: 1.0000 mg | ORAL_TABLET | Freq: Four times a day (QID) | ORAL | Status: DC | PRN

## 2023-03-01 MED ORDER — LORAZEPAM 1 MG PO TABS
1.0000 mg | ORAL_TABLET | Freq: Two times a day (BID) | ORAL | Status: AC
  Administered 2023-03-03 – 2023-03-04 (×2): 1 mg via ORAL
  Filled 2023-03-01 (×2): qty 1

## 2023-03-01 MED ORDER — MAGNESIUM HYDROXIDE 400 MG/5ML PO SUSP
30.0000 mL | Freq: Every day | ORAL | Status: DC | PRN
Start: 2023-03-01 — End: 2023-03-07

## 2023-03-01 MED ORDER — ALUM & MAG HYDROXIDE-SIMETH 200-200-20 MG/5ML PO SUSP
30.0000 mL | ORAL | Status: DC | PRN
Start: 2023-03-01 — End: 2023-03-07

## 2023-03-01 MED ORDER — LORAZEPAM 1 MG PO TABS
1.0000 mg | ORAL_TABLET | Freq: Three times a day (TID) | ORAL | Status: AC
  Administered 2023-03-02 – 2023-03-03 (×3): 1 mg via ORAL
  Filled 2023-03-01 (×3): qty 1

## 2023-03-01 MED ORDER — HYDROXYZINE HCL 25 MG PO TABS: 25.0000 mg | ORAL_TABLET | Freq: Four times a day (QID) | ORAL | Status: DC | PRN

## 2023-03-01 MED ORDER — LORAZEPAM 1 MG PO TABS: 1.0000 mg | ORAL_TABLET | Freq: Three times a day (TID) | ORAL | Status: DC

## 2023-03-01 MED ORDER — ACETAMINOPHEN 325 MG PO TABS
650.0000 mg | ORAL_TABLET | Freq: Four times a day (QID) | ORAL | Status: DC | PRN
  Administered 2023-03-02 – 2023-03-06 (×2): 650 mg via ORAL
  Filled 2023-03-01 (×3): qty 2

## 2023-03-01 MED ORDER — LOPERAMIDE HCL 2 MG PO CAPS: 2.0000 mg | ORAL_CAPSULE | ORAL | Status: AC | PRN

## 2023-03-01 MED ORDER — ADULT MULTIVITAMIN W/MINERALS CH
1.0000 | ORAL_TABLET | Freq: Every day | ORAL | Status: DC
  Administered 2023-03-02 – 2023-03-07 (×6): 1 via ORAL
  Filled 2023-03-01 (×6): qty 1

## 2023-03-01 MED ORDER — LORAZEPAM 1 MG PO TABS
1.0000 mg | ORAL_TABLET | Freq: Four times a day (QID) | ORAL | Status: DC
  Administered 2023-03-01 (×2): 1 mg via ORAL
  Filled 2023-03-01 (×2): qty 1

## 2023-03-01 MED ORDER — LORAZEPAM 1 MG PO TABS: 1.0000 mg | ORAL_TABLET | Freq: Two times a day (BID) | ORAL | Status: DC

## 2023-03-01 MED ORDER — ONDANSETRON 4 MG PO TBDP: 4.0000 mg | ORAL_TABLET | Freq: Four times a day (QID) | ORAL | Status: DC | PRN

## 2023-03-01 MED ORDER — LORAZEPAM 1 MG PO TABS
1.0000 mg | ORAL_TABLET | Freq: Four times a day (QID) | ORAL | Status: AC | PRN
Start: 2023-03-01 — End: 2023-03-04

## 2023-03-01 MED ORDER — THIAMINE MONONITRATE 100 MG PO TABS
100.0000 mg | ORAL_TABLET | Freq: Every day | ORAL | Status: DC
  Administered 2023-03-02 – 2023-03-07 (×6): 100 mg via ORAL
  Filled 2023-03-01 (×6): qty 1

## 2023-03-01 MED ORDER — LOPERAMIDE HCL 2 MG PO CAPS: 2.0000 mg | ORAL_CAPSULE | ORAL | Status: DC | PRN

## 2023-03-01 MED ORDER — LORAZEPAM 1 MG PO TABS
1.0000 mg | ORAL_TABLET | Freq: Every day | ORAL | Status: AC
  Administered 2023-03-05: 1 mg via ORAL
  Filled 2023-03-01: qty 1

## 2023-03-01 MED ORDER — ZIPRASIDONE MESYLATE 20 MG IM SOLR
20.0000 mg | INTRAMUSCULAR | Status: DC | PRN
Start: 2023-03-01 — End: 2023-03-07

## 2023-03-01 MED ORDER — OLANZAPINE 10 MG PO TBDP: 10.0000 mg | ORAL_TABLET | Freq: Three times a day (TID) | ORAL | Status: DC | PRN

## 2023-03-01 MED ORDER — ADULT MULTIVITAMIN W/MINERALS CH
1.0000 | ORAL_TABLET | Freq: Every day | ORAL | Status: DC
  Administered 2023-03-01: 1 via ORAL
  Filled 2023-03-01: qty 1

## 2023-03-01 MED ORDER — ONDANSETRON 4 MG PO TBDP: 4.0000 mg | ORAL_TABLET | Freq: Four times a day (QID) | ORAL | Status: AC | PRN

## 2023-03-01 NOTE — ED Notes (Signed)
 Pt is currently sleeping, no distress noted, environmental check complete, will continue to monitor patient for safety.
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
## 2023-03-01 NOTE — ED Notes (Signed)
 Patient observed/assessed in bed/chair resting quietly appearing in no distress and verbalizing no complaints at this time. Will continue to monitor.

## 2023-03-01 NOTE — ED Notes (Signed)

## 2023-03-01 NOTE — ED Notes (Signed)
 Pt transferred observation unit to [facility] requesting ETOH detox. Pt reports last use was yesterday where he drank 2- 40oz. Beer. Denies withdrawal sx at present. Denies SI/HI/AVH. Calm, cooperative throughout interview process. Skin assessment completed. Oriented to unit. Meal and drink offered. Pt verbally contract for safety. Will monitor for safety.

## 2023-03-01 NOTE — ED Notes (Signed)
 Patient resting with eyes closed. Respirations even and unlabored. No acute distress noted. Environment secured. Will continue to monitor for safety.

## 2023-03-01 NOTE — Discharge Instructions (Addendum)
 For your safety and to ensure you receive the most appropriate care, you are being transferred to the Facility Based Crisis [facility]). This transfer is necessary for further evaluation and treatment that is not available at our urgent care facility.  The Facility Based Crisis [facility]) staff will provide:  Comprehensive medical evaluation.  Any additional treatments or tests needed to address your current condition.  Transportation will be arranged, and the Facility Based Crisis [facility]) team will be informed of your situation before your arrival.

## 2023-03-01 NOTE — ED Notes (Signed)
 SPIRITUALITY GROUP NOTE  Spirituality group facilitated by Chaplain Nirvana Blanchett, MDiv, BCC.  Group Description: Group focused on topic of hope. Patients participated in facilitated discussion around topic, connecting with one another around experiences and definitions for hope. Group members engaged with visual Exercise, reflecting on what hope looks like for them today. Group engaged in discussion around how their definitions of hope are present today in hospital.  Modalities: Psycho-social ed, Adlerian, Narrative, MI  Patient Progress: Evan Wilson did not attend due to arriving on unit as group was in process

## 2023-03-02 DIAGNOSIS — F1010 Alcohol abuse, uncomplicated: Secondary | ICD-10-CM | POA: Diagnosis not present

## 2023-03-02 DIAGNOSIS — Z79899 Other long term (current) drug therapy: Secondary | ICD-10-CM | POA: Diagnosis not present

## 2023-03-02 DIAGNOSIS — F331 Major depressive disorder, recurrent, moderate: Secondary | ICD-10-CM | POA: Diagnosis not present

## 2023-03-02 DIAGNOSIS — Z1152 Encounter for screening for COVID-19: Secondary | ICD-10-CM | POA: Diagnosis not present

## 2023-03-02 LAB — VITAMIN B12: Vitamin B-12: 674 pg/mL (ref 180–914)

## 2023-03-02 MED ORDER — MIRTAZAPINE 15 MG PO TABS
15.0000 mg | ORAL_TABLET | Freq: Every day | ORAL | Status: DC
  Administered 2023-03-02 – 2023-03-06 (×5): 15 mg via ORAL
  Filled 2023-03-02 (×5): qty 1

## 2023-03-02 MED ORDER — CYCLOBENZAPRINE HCL 10 MG PO TABS
5.0000 mg | ORAL_TABLET | Freq: Once | ORAL | Status: DC | PRN
  Administered 2023-03-04 – 2023-03-06 (×2): 5 mg via ORAL
  Filled 2023-03-02 (×3): qty 1

## 2023-03-02 MED ORDER — GABAPENTIN 300 MG PO CAPS
600.0000 mg | ORAL_CAPSULE | Freq: Three times a day (TID) | ORAL | Status: DC
  Administered 2023-03-02 – 2023-03-07 (×16): 600 mg via ORAL
  Filled 2023-03-02 (×16): qty 2

## 2023-03-02 NOTE — ED Notes (Signed)
 Patient resting with eyes closed in no apparent acute distress. Respirations even and unlabored. Environment secured. Safety checks in place according to facility policy.

## 2023-03-02 NOTE — ED Notes (Signed)
 Abnormal BP of 140/80 obtained manually. Patient asymptomatic, in no acute distress. Provider Lamar Sprinkles, MD made aware.
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
## 2023-03-02 NOTE — Group Note (Signed)
 Group Topic: Recovery Basics  Group Date: 03/02/2023 Start Time: 2000 End Time: 2100 Facilitators: Mariel Chatters  Department: [facility]  Number of Participants: 3  Group Focus: abuse issues, acceptance, coping skills, relapse prevention, and self-awareness Treatment Modality:  Leisure Development Interventions utilized were leisure development, story telling, and support Purpose: enhance coping skills, express feelings, and relapse prevention strategies  Name: RAYEN DAFOE Date of Birth: [birth year]  MR: [id number]    Level of Participation: active Quality of Participation: cooperative, offered feedback, and supportive Interactions with others: gave feedback Mood/Affect: appropriate and positive Triggers (if applicable): n/a Cognition: coherent/clear Progress: Gaining insight Response: pt was an active participant in the AA meeting and participated in story telling.  Plan: pt will be encouraged to continue attending groups.  Patients Problems:  Patient Active Problem List   Diagnosis Date Noted   Alcohol  use disorder, moderate, dependence (HCC) 03/01/2023   Recurrent major depressive disorder (HCC) 03/01/2023   Alcohol  abuse 03/01/2023   Rash and nonspecific skin eruption 12/13/2022   Obesity (BMI 30-39.9) 12/13/2022   Anxiety and depression 12/13/2022   Recurrent right inguinal hernia    Right inguinal hernia

## 2023-03-02 NOTE — ED Notes (Signed)
 Patient eating dinner.

## 2023-03-02 NOTE — ED Notes (Signed)
Patient eating breakfast. °

## 2023-03-02 NOTE — ED Notes (Signed)
 Pt is in his room resting in bed. Pt denies SI/HI/AVH. No acute distress noted. Will continue to monitor for safety.

## 2023-03-02 NOTE — Group Note (Signed)
 Group Topic: Overcoming Obstacles  Group Date: 03/02/2023 Start Time: 1235 End Time: 1300 Facilitators: Winfred Byers, LPN  Department: [facility]  Number of Participants: 4  Group Focus: Overcoming Obstacles Treatment Modality:  Patient-Centered Therapy Interventions utilized were story telling and support Purpose: Perseverance in the face of adversity  Name: Evan Wilson Date of Birth: 1976/02/10  MR: [id number]    Level of Participation: moderate Quality of Participation: attentive and cooperative Interactions with others: gave feedback Mood/Affect: appropriate Triggers (if applicable): N/A Cognition: insightful Progress: Gaining insight Response: Tolerated Well. Plan: The patient will be encouraged to persevere through difficult times and challenging situations.  Patients Problems:  Patient Active Problem List   Diagnosis Date Noted   Alcohol  use disorder, moderate, dependence (HCC) 03/01/2023   Recurrent major depressive disorder (HCC) 03/01/2023   Alcohol  abuse 03/01/2023   Rash and nonspecific skin eruption 12/13/2022   Obesity (BMI 30-39.9) 12/13/2022   Anxiety and depression 12/13/2022   Recurrent right inguinal hernia    Right inguinal hernia

## 2023-03-02 NOTE — ED Notes (Signed)
 Patient eating lunch.

## 2023-03-02 NOTE — ED Notes (Signed)
 Pt is currently sleeping, no distress noted, environmental check complete, will continue to monitor patient for safety.

## 2023-03-02 NOTE — ED Notes (Signed)
 Patient alert & oriented x4. Denies intent to harm self or others when asked. Denies A/VH. Patient reports pain 7/10 in leg but is denying pain medication at this time stating they would like to wait to see if gabapentin  could be prescribed. No acute distress noted. Support and encouragement provided. Routine safety checks conducted per facility protocol. Encouraged patient to notify staff if any thoughts of harm towards self or others arise. Patient verbalizes understanding and agreement.

## 2023-03-02 NOTE — ED Notes (Addendum)
 PRN Tylenol administered due to patient reported pain "5.5/10". Medication administered with no complications. Environment secured, safety checks in place per facility policy.

## 2023-03-02 NOTE — ED Notes (Signed)
 PRN Atarax given due to patient reported anxiety 5/10. Medication administered with no complications. Environment secured, safety checks in place per facility policy.

## 2023-03-03 DIAGNOSIS — Z1152 Encounter for screening for COVID-19: Secondary | ICD-10-CM | POA: Diagnosis not present

## 2023-03-03 DIAGNOSIS — F1010 Alcohol abuse, uncomplicated: Secondary | ICD-10-CM | POA: Diagnosis not present

## 2023-03-03 DIAGNOSIS — F331 Major depressive disorder, recurrent, moderate: Secondary | ICD-10-CM | POA: Diagnosis not present

## 2023-03-03 DIAGNOSIS — Z79899 Other long term (current) drug therapy: Secondary | ICD-10-CM | POA: Diagnosis not present

## 2023-03-03 MED ORDER — NALTREXONE HCL 50 MG PO TABS
50.0000 mg | ORAL_TABLET | Freq: Every day | ORAL | Status: DC
  Administered 2023-03-03 – 2023-03-07 (×5): 50 mg via ORAL
  Filled 2023-03-03 (×5): qty 1

## 2023-03-03 NOTE — ED Notes (Signed)
Pt fed dinner

## 2023-03-03 NOTE — Group Note (Signed)
 Group Topic: Identity and Relationships  Group Date: 03/03/2023 Start Time: 1200 End Time: 1220 Facilitators: Armida Adelita RAMAN, LPN  Department: [facility]  Number of Participants: 5  Group Focus: nursing group Treatment Modality:  Patient-Centered Therapy Interventions utilized were support Purpose: enhance coping skills  Name: Evan Wilson Date of Birth: 07-Apr-1975  MR: [id number]    Level of Participation: active Quality of Participation: cooperative Interactions with others: gave feedback Mood/Affect: appropriate Triggers (if applicable): n/a Cognition: coherent/clear Progress: Gaining insight Response: appropriate response to group session Plan: follow-up needed  Patients Problems:  Patient Active Problem List   Diagnosis Date Noted   Alcohol  use disorder, moderate, dependence (HCC) 03/01/2023   Recurrent major depressive disorder (HCC) 03/01/2023   Alcohol  abuse 03/01/2023   Rash and nonspecific skin eruption 12/13/2022   Obesity (BMI 30-39.9) 12/13/2022   Anxiety and depression 12/13/2022   Recurrent right inguinal hernia    Right inguinal hernia

## 2023-03-03 NOTE — ED Notes (Signed)
 Patient in bedroom, calm and collected. No acute distress noted. No concerns voiced. Informed patient to notify staff with any needs or assistance. Patient verbalized understanding or agreement. Safety checks in place per facility policy.

## 2023-03-03 NOTE — ED Notes (Signed)
 Patient is sleeping. Respirations equal and unlabored, skin warm and dry. No change in assessment or acuity. Routine safety checks conducted according to facility protocol. Will continue to monitor for safety.

## 2023-03-03 NOTE — ED Notes (Signed)
 Patient resting with eyes closed in no apparent acute distress. Respirations even and unlabored. Environment secured. Safety checks in place according to facility policy.

## 2023-03-03 NOTE — ED Notes (Signed)
 Patient alert & oriented x4. Denies intent to harm self or others when asked. Denies A/VH. Patient reports pain in ankle 5/10, scheduled gabapentin  administered. Writer informed patient to inform staff if pain worsens and additional medication can be administered, patient voiced understanding. No acute distress noted. Support and encouragement provided. Routine safety checks conducted per facility protocol. Encouraged patient to notify staff if any thoughts of harm towards self or others arise. Patient verbalizes understanding and agreement.

## 2023-03-03 NOTE — ED Notes (Signed)
 Pt sleeping at present, no distress noted.  Monitoring for safety.

## 2023-03-03 NOTE — ED Notes (Signed)
Pt fed breakfast 

## 2023-03-03 NOTE — ED Notes (Signed)
Pt fed lunch.
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
## 2023-03-03 NOTE — ED Notes (Signed)
Pt A&O x 4, sleeping at present, no distress noted.  Monitoring for safety. 

## 2023-03-04 ENCOUNTER — Encounter (HOSPITAL_COMMUNITY): Payer: Self-pay

## 2023-03-04 DIAGNOSIS — F331 Major depressive disorder, recurrent, moderate: Secondary | ICD-10-CM | POA: Diagnosis not present

## 2023-03-04 DIAGNOSIS — F1010 Alcohol abuse, uncomplicated: Secondary | ICD-10-CM | POA: Diagnosis not present

## 2023-03-04 DIAGNOSIS — Z79899 Other long term (current) drug therapy: Secondary | ICD-10-CM | POA: Diagnosis not present

## 2023-03-04 DIAGNOSIS — Z1152 Encounter for screening for COVID-19: Secondary | ICD-10-CM | POA: Diagnosis not present
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
## 2023-03-04 NOTE — ED Notes (Signed)
 Pt is in the dayroom watching TV. Pt denies SI/HI/AVH. Pt has no further complain.No acute distress noted. Will continue to monitor for safety and provide support.

## 2023-03-04 NOTE — Discharge Instructions (Addendum)
 Intake Appointment has been scheduled for 03/06/2022 at 10:00am at [facility] listed below:  [facility] [address] [place], [place] [address] Mailing Address: [address], [place], [place] [address] Hours: Mon-Fri 8AM-5PM Phone: [phone] Fax: [phone]  Adult Services Advanced Access Walk-In Assessments - All Disabilities Routine Outpatient Therapy Psychiatry/Med Management Substance Abuse Intensive Outpatient (SAIOP) Mobile Crisis Assertive Community Treatment Team (ACTT) Jail Services Medication Assisted Treatment - MAT (Suboxone) Tailored Care Management (TCM)  [facility] [address], [place], [address] [phone] phone  New Patient Assessment/Therapy Walk-Ins:  Monday and Wednesday: 8 am until slots are full. Every 1st and 2nd Fridays of the month: 1 pm - 5 pm.  NO ASSESSMENT/THERAPY WALK-INS ON TUESDAYS OR THURSDAYS  New Patient Assessment/Medication Management Walk-Ins:  Monday - Friday:  8 am - 11 am.  For all walk-ins, we ask that you arrive by 7:30 am because patients will be seen in the order of arrival.  Availability is limited; therefore, you may not be seen on the same day that you walk-in.  Our goal is to serve and meet the needs of our community to the best of our ability.  SUBSTANCE USE TREATMENT for Medicaid and State Funded/IPRS  [facility] [address], [place], [address] [phone] phone NOTE: [facility] is no longer offering IOP services.  Serves those who are low-income or have no insurance.  [facility] [address] [place], [place], [address] [phone] phone [phone] fax NOTE: Does have Substance Abuse-Intensive Outpatient Program [facility]) as well as transitional housing if eligible.  [facility] [address] [place], [place], [address] [phone] phone [phone] fax  [facility] [phone] [place], [place], [address] [phone] phone [phone] fax  HALFWAY HOUSES:  [facility] [phone]  [employer].oxfordvacancies.[email or web address]  12 STEP PROGRAMS:  Alcoholics Anonymous of [place] softwarechalet.be  Narcotics Anonymous of [place] hitprotect.dk  Al-Anon of [place], [place] www.[place]alanon.org/find-meetings.html  Nar-Anon https://nar-anon.org/find-a-meetin  List of Residential placements:   [name] in [place]: [phone]  [facility]: [phone]  [place], [place] [phone]: Male and male facility; 30-day program: (uninsured and Medicaid such as [place], [place], [place], partners)  [facility]: [phone]; men and women's facility; 28 days; Can have Medicaid tailored plan Tour Manager or Partners)  [facility]: [phone] [place] or [place]; 28 day program; must be fully detox; tailored Medicaid or no insurance  [address] in [place], [place]; [phone]; 28 day all males program; no insurance accepted  [facility] Referral in [place]: [place] [phone] (no insurance or Medicaid only); 90 days; outpatient services but provide housing in apartments [place]  RTS Admission: [phone]: Patient must complete phone screening for placement: [place], [place]; 6 month program; uninsured, Medicaid, and [employer] financial.   [facility]: no insurance required; [phone]  [facility]: [phone]; Intake: [place]; Must fill out application online; Steffan Delay [phone] x [phone] [facility] in [place], [place]: [phone]; Admissions Coordinators Mr. Marinda or Alm Eagles; 90 day program.  [facility]: [place], [place] [phone]; Co-Ed 9 month to a year program; Online application; Men entry fee is $500 (6-62months);  [employer]: [address]  [place], [place] [address]; no fee or insurance required; minimum of 2 years; Highly structured; work based; Intake Coordinator is [place] [phone]  [facility] in [place], [place]: [phone]; Fax number is [phone]; website: www.[facility].org; Requires 3-6 page autobiography;  2 year program (18 months and then 67month transitional housing); Admission fee is $300; no insurance needed; work Automotive Engineer in [place], [place]: [employer] Desk Staff: [place] [phone]: They have a Men's Regenerations Program 6-10months. Free program; There is an initial $300 fee however, they are willing to work with patients regarding that. Application is online.  [facility]: Admissions [phone] [place] Free ext [phone]; Any 7-90 day program is out of pocket; 12 month program is free of charge; there is a $275 entry fee; Patient is responsible for own transportation

## 2023-03-04 NOTE — Group Note (Signed)
 Group Topic: Recovery Basics & AA Group meeting Group Date: 03/04/2023 Start Time: 1003 End Time: 1105 Facilitators: Brien Can B  Department: [facility]  Number of Participants: 2  Group Focus: self-awareness and substance abuse education Treatment Modality:  Psychoeducation Interventions utilized were group exercise and support Purpose: increase insight and reinforce self-care, relapse pervention  Name: Evan Wilson Date of Birth: [birth year]  MR: [id number]    Level of Participation: active Quality of Participation: attentive and cooperative Interactions with others: gave feedback Mood/Affect: positive Triggers (if applicable): N/A Cognition: coherent/clear Progress: Moderate Response: N/A Plan: follow-up needed  Patients Problems:  Patient Active Problem List   Diagnosis Date Noted   Alcohol  use disorder, moderate, dependence (HCC) 03/01/2023   Recurrent major depressive disorder (HCC) 03/01/2023   Alcohol  abuse 03/01/2023   Rash and nonspecific skin eruption 12/13/2022   Obesity (BMI 30-39.9) 12/13/2022   Anxiety and depression 12/13/2022   Recurrent right inguinal hernia    Right inguinal hernia

## 2023-03-04 NOTE — ED Notes (Signed)
 Patient is sleeping. Respirations equal and unlabored, skin warm and dry. No change in assessment or acuity. Routine safety checks conducted according to facility protocol. Will continue to monitor for safety.

## 2023-03-04 NOTE — Tx Team (Signed)
 LCSW met with patient at bedside to assess current mood, affect, physical state, and inquire about needs/goals while here in [facility] and after discharge. Patient reports he presented due to needing help with his alcohol  use. Patient reports daily use for at least the last five years and reports he is trying to quit. Patient reports he lives at home with his wife and two children ages 34 and 78. Patient reports having a great deal of support from his wife, however reports he knows he has to get it together. Patient reports his current goal is to detox and then seek residential placement for his alcohol  use. Patient reports he would like to go to a 14 day program and then return home if able. Patient reports he has never sought treatment in the past, however is open to further treatment and advised LCSW to speak with his wife regarding a plan. Patient reports his wife's name is Torez Beauregard and best contact number is [phone]. Patient currently denies any SI/HI/AVH and reports mood as Okay. Patient aware that LCSW will follow up with his wife and then send referrals out as appropriate.No other needs were reported at this time by patient.   LCSW attempted to get in contact with the patient's wife Harlene at number provided above and number on facesheet [phone], however one number is disconnected and the other reports subscriber not available. LCSW will speak with patient regarding additional contacts if able. Referral will be sent to [facility] for review. LCSW will continue to follow and provide support to patient while on [facility] unit.   Merlynn Lazier, LCSW Clinical Social Worker [place] [facility] Ph: [phone]

## 2023-03-04 NOTE — Progress Notes (Signed)
 Patient is currently in is room on his bed with eyes closed. Breathing is even and unlabored.

## 2023-03-04 NOTE — BH IP Treatment Plan (Signed)
 Interdisciplinary Treatment and Diagnostic Plan Update  03/04/2023 Time of Session: 8:41AM Evan Wilson MRN: [id number]  Diagnosis:  Final diagnoses:  Moderate episode of recurrent major depressive disorder (HCC)  Alcohol  use disorder, moderate, dependence (HCC)  PTSD (post-traumatic stress disorder)  Psychosis, unspecified psychosis type (HCC)     Current Medications:  Current Facility-Administered Medications  Medication Dose Route Frequency Provider Last Rate Last Admin   acetaminophen  (TYLENOL ) tablet 650 mg  650 mg Oral Q6H PRN Delsie Lynwood Morene Lavone, MD   650 mg at 03/02/23 1815   alum & mag hydroxide-simeth (MAALOX/MYLANTA) 200-200-20 MG/5ML suspension 30 mL  30 mL Oral Q4H PRN Delsie [place] Morene Lavone, MD       cyclobenzaprine  (FLEXERIL ) tablet 5 mg  5 mg Oral Once PRN Rainelle Pfeiffer, MD   5 mg at 03/04/23 9072   gabapentin  (NEURONTIN ) capsule 600 mg  600 mg Oral TID Rainelle Pfeiffer, MD   600 mg at 03/04/23 0926   [START ON 03/05/2023] LORazepam  (ATIVAN ) tablet 1 mg  1 mg Oral Daily Delsie Lynwood Morene Lavone, MD       magnesium  hydroxide (MILK OF MAGNESIA) suspension 30 mL  30 mL Oral Daily PRN Delsie [place] Morene Lavone, MD       mirtazapine  (REMERON ) tablet 15 mg  15 mg Oral QHS Rainelle Pfeiffer, MD   15 mg at 03/03/23 2101   multivitamin with minerals tablet 1 tablet  1 tablet Oral Daily Delsie [place] Morene Lavone, MD   1 tablet at 03/04/23 9073   naltrexone  (DEPADE) tablet 50 mg  50 mg Oral Daily Rainelle Pfeiffer, MD   50 mg at 03/04/23 9072   OLANZapine  zydis (ZYPREXA ) disintegrating tablet 10 mg  10 mg Oral Q8H PRN Delsie [place] Morene Lavone, MD       And   ziprasidone  (GEODON ) injection 20 mg  20 mg Intramuscular PRN Delsie Lyn[place] Morene Lavone, MD       sertraline  (ZOLOFT ) tablet 50 mg  50 mg Oral Daily Delsie [place] Morene Lavone, MD   50 mg at 03/04/23 9072   thiamine  (VITAMIN B1) tablet 100 mg  100 mg Oral Daily Delsie [place] Morene Lavone, MD   100 mg at 03/04/23 9073   Current Outpatient Medications  Medication Sig Dispense Refill   gabapentin  (NEURONTIN ) 300 MG capsule Take 600 mg by mouth 3 (three) times daily.     mirtazapine  (REMERON ) 15 MG tablet Take 1 tablet (15 mg total) by mouth at bedtime. 30 tablet 2   AMINO ACIDS-PROTEIN HYDROLYS PO Take 15 mLs by mouth 2 (two) times daily.     cyclobenzaprine  (FLEXERIL ) 5 MG tablet Take 1 tablet (5 mg total) by mouth once as needed for up to 1 dose for muscle spasms. 30 tablet 2   Multiple Vitamin (MULTIVITAMIN) tablet Take 1 tablet by mouth daily.     PTA Medications: Prior to Admission medications   Medication Sig Start Date End Date Taking? Authorizing Provider  gabapentin  (NEURONTIN ) 300 MG capsule Take 600 mg by mouth 3 (three) times daily.   Yes [provider]  mirtazapine  (REMERON ) 15 MG tablet Take 1 tablet (15 mg total) by mouth at bedtime. 12/13/22  Yes Del Wilhelmena Falter, Iliana, FNP  AMINO ACIDS-PROTEIN HYDROLYS PO Take 15 mLs by mouth 2 (two) times daily.    [provider]  cyclobenzaprine  (FLEXERIL ) 5 MG tablet Take 1 tablet (5 mg total) by mouth once as needed for up to 1 dose for muscle spasms. 12/13/22  Del Wilhelmena Falter, [place], FNP  Multiple Vitamin (MULTIVITAMIN) tablet Take 1 tablet by mouth daily.    [provider]    Patient Stressors: Substance abuse    Patient Strengths: Capable of independent living  General fund of knowledge  Motivation for treatment/growth  Supportive family/friends   Treatment Modalities: Medication Management, Group therapy, Case management,  1 to 1 session with clinician, Psychoeducation, Recreational therapy.   Physician Treatment Plan for Primary and Secondary Diagnosis:  Final diagnoses:  Moderate episode of recurrent major depressive disorder (HCC)  Alcohol  use disorder, moderate, dependence (HCC)  PTSD (post-traumatic stress disorder)  Psychosis, unspecified psychosis  type (HCC)   Long Term Goal(s): Improvement in symptoms so as ready for discharge  Short Term Goals: Patient will verbalize feelings in meetings with treatment team members. Patient will attend at least of 50% of the groups daily. Pt will complete the PHQ9 on admission, day 3 and discharge. Patient will participate in completing the [place] Suicide Severity Rating Scale Patient will score a low risk of violence for 24 hours prior to discharge Patient will take medications as prescribed daily.  Medication Management: Evaluate patient's response, side effects, and tolerance of medication regimen.  Therapeutic Interventions: 1 to 1 sessions, Unit Group sessions and Medication administration.  Evaluation of Outcomes: Progressing  LCSW Treatment Plan for Primary Diagnosis:  Final diagnoses:  Moderate episode of recurrent major depressive disorder (HCC)  Alcohol  use disorder, moderate, dependence (HCC)  PTSD (post-traumatic stress disorder)  Psychosis, unspecified psychosis type (HCC)    Long Term Goal(s): Safe transition to appropriate next level of care at discharge.  Short Term Goals: Facilitate acceptance of mental health diagnosis and concerns through verbal commitment to aftercare plan and appointments at discharge., Patient will identify one social support prior to discharge to aid in patient's recovery., Patient will attend AA/NA groups as scheduled., Identify minimum of 2 triggers associated with mental health/substance abuse issues with treatment team members., and Increase skills for wellness and recovery by attending 50% of scheduled groups.  Therapeutic Interventions: Assess for all discharge needs, 1 to 1 time with Child psychotherapist, Explore available resources and support systems, Assess for adequacy in community support network, Educate family and significant other(s) on suicide prevention, Complete Psychosocial Assessment, Interpersonal group therapy.  Evaluation of Outcomes:  Progressing   Progress in Treatment: Attending groups: Yes. Participating in groups: Yes. Taking medication as prescribed: Yes. Toleration medication: Yes. Family/Significant other contact made: Yes, individual(s) contacted:  LCSW attempted to get in contact with patient's wife to discuss disposition plans, however number stated subscriber not available. LCSW will follow up with patient for more information.  Patient understands diagnosis: Yes. Discussing patient identified problems/goals with staff: Yes. Medical problems stabilized or resolved: Yes. Denies suicidal/homicidal ideation: Yes. Issues/concerns per patient self-inventory: Yes. Other: alcohol  use and need to further treatment  New problem(s) identified: No, Describe:  other than reported on admission.   New Short Term/Long Term Goal(s): Safe transition to appropriate next level of care at discharge, Engage patient in therapeutic group addressing interpersonal concerns. Engage patient in aftercare planning with referrals and resources, Increase ability to appropriately verbalize feelings, Facilitate acceptance of mental health diagnosis and concerns and Identify triggers associated with mental health/substance abuse issues.   Patient Goals:  Patient would like to consider a 14 day residential program and then return home. Patient was informed that options may be limited as most programs are at least 21-28 days. Patient reports being open to that option, however advised LCSW  to speak with his wife for further planning.   Discharge Plan or Barriers: LCSW will follow up with wife to discuss disposition plans. LCSW will send referrals out for review for residential placement with hopes to secure placement in 3-5 days. Updates will be provided as received.   Reason for Continuation of Hospitalization: Withdrawal symptoms  Estimated Length of Stay: 3-5 days  Last 3 [place] Suicide Severity Risk Score: Flowsheet Row ED from  03/01/2023 in [facility] ED from [facility]/28/2023 in [facility] ED from 02/16/2023 in [facility] at [facility]  C-SSRS RISK CATEGORY Moderate Risk Low Risk No Risk       Last [facility] 2/9 Scores:    03/02/2023    2:34 PM 12/21/2022    2:35 PM 12/13/2022   11:17 AM  Depression screen PHQ 2/9  Decreased Interest 2 2 0  Down, Depressed, Hopeless 3 2 1   PHQ - 2 Score 5 4 1   Altered sleeping 3 3 3   Tired, decreased energy 1 1 1   Change in appetite 1 1 1   Feeling bad or failure about yourself  3 2 3   Trouble concentrating 2 0 1  Moving slowly or fidgety/restless  3 0  Suicidal thoughts 1 1 0  PHQ-9 Score 16 15 10   Difficult doing work/chores  Somewhat difficult     Scribe for Treatment Team: Merlynn GORMAN Lazier, LCSW 03/04/2023 9:30 AM

## 2023-03-04 NOTE — ED Notes (Signed)
 Pt sleeping at present, no distress noted.  Monitoring for safety.

## 2023-03-04 NOTE — Group Note (Signed)
 Group Topic: Overcoming Obstacles  Group Date: 03/04/2023 Start Time: 2000 End Time: 2045 Facilitators: Joan Plowman B  Department: [facility]  Number of Participants: 1  Group Focus: activities of daily living skills, check in, communication, coping skills, daily focus, feeling awareness/expression, goals/reality orientation, and individual meeting Treatment Modality:  Leisure Development Interventions utilized were assignment, leisure development, problem solving, and support Purpose: express feelings, increase insight, regain self-worth, and reinforce self-care  Name: Evan Wilson Date of Birth: November 11, 1975  MR: [id number]    Level of Participation: PT DID NOT ATTEND GROUP Quality of Participation: cooperative Interactions with others: gave feedback Mood/Affect: appropriate Triggers (if applicable): NA Cognition: coherent/clear Progress: None Response: NA Plan: patient will be encouraged to go to groups while he is in here.   Patients Problems:  Patient Active Problem List   Diagnosis Date Noted   Alcohol  use disorder, moderate, dependence (HCC) 03/01/2023   Recurrent major depressive disorder (HCC) 03/01/2023   Alcohol  abuse 03/01/2023   Rash and nonspecific skin eruption 12/13/2022   Obesity (BMI 30-39.9) 12/13/2022   Anxiety and depression 12/13/2022   Recurrent right inguinal hernia    Right inguinal hernia

## 2023-03-05 DIAGNOSIS — F1010 Alcohol abuse, uncomplicated: Secondary | ICD-10-CM | POA: Diagnosis not present

## 2023-03-05 DIAGNOSIS — F331 Major depressive disorder, recurrent, moderate: Secondary | ICD-10-CM | POA: Diagnosis not present

## 2023-03-05 DIAGNOSIS — Z79899 Other long term (current) drug therapy: Secondary | ICD-10-CM | POA: Diagnosis not present

## 2023-03-05 DIAGNOSIS — Z1152 Encounter for screening for COVID-19: Secondary | ICD-10-CM | POA: Diagnosis not present

## 2023-03-05 MED ORDER — HYDROXYZINE HCL 25 MG PO TABS
25.0000 mg | ORAL_TABLET | Freq: Once | ORAL | Status: AC
  Administered 2023-03-05: 25 mg via ORAL
  Filled 2023-03-05: qty 1

## 2023-03-05 NOTE — ED Notes (Signed)
 Pt was provided breakfast.
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
## 2023-03-05 NOTE — Group Note (Signed)
 Group Topic: Change and Accountability  Group Date: 03/04/2023 Start Time: 1300 End Time: 1330 Facilitators: Gerlean Leeroy HERO, RN  Department: [facility]  Number of Participants: 3  Group Focus: activities of daily living skills Treatment Modality:  Skills Training Interventions utilized were patient education Purpose: enhance coping skills  Name: Evan Wilson Date of Birth: 1975/09/19  MR: [id number]    Level of Participation: moderate Quality of Participation: cooperative Interactions with others: gave feedback Mood/Affect: appropriate Triggers (if applicable): N/A Cognition: coherent/clear Progress: Moderate Response: engaged Plan: patient will be encouraged to participate in their own care plans and implement new changes  Patients Problems:  Patient Active Problem List   Diagnosis Date Noted   Alcohol  use disorder, moderate, dependence (HCC) 03/01/2023   Recurrent major depressive disorder (HCC) 03/01/2023   Alcohol  abuse 03/01/2023   Rash and nonspecific skin eruption 12/13/2022   Obesity (BMI 30-39.9) 12/13/2022   Anxiety and depression 12/13/2022   Recurrent right inguinal hernia    Right inguinal hernia

## 2023-03-05 NOTE — Group Note (Signed)
 Group Topic: Change and Accountability  Group Date: 03/05/2023 Start Time: 1200 End Time: 1230 Facilitators: Gerlean Leeroy HERO, RN  Department: [facility]  Number of Participants: 3  Group Focus: activities of daily living skills Treatment Modality:  Behavior Modification Therapy Interventions utilized were clarification Purpose: improve communication skills  Name: Evan Wilson Date of Birth: 1975-09-11  MR: [id number]    Level of Participation: active Quality of Participation: attentive Interactions with others: gave feedback Mood/Affect: appropriate Triggers (if applicable): N/A Cognition: coherent/clear Progress: Gaining insight Response: engaged Plan: patient will be encouraged to be active and take initiative in his care plan  Patients Problems:  Patient Active Problem List   Diagnosis Date Noted   Alcohol  use disorder, moderate, dependence (HCC) 03/01/2023   Recurrent major depressive disorder (HCC) 03/01/2023   Alcohol  abuse 03/01/2023   Rash and nonspecific skin eruption 12/13/2022   Obesity (BMI 30-39.9) 12/13/2022   Anxiety and depression 12/13/2022   Recurrent right inguinal hernia    Right inguinal hernia

## 2023-03-05 NOTE — ED Notes (Signed)
 Patient reports no issues overnight.  He is calm and cooperative, reports some anxiety regarding where he goes after discharge from [facility].  He is cooperative with scheduled medications as ordered.  He is alert and denies pain on shift assessment.

## 2023-03-05 NOTE — Discharge Planning (Signed)
 LCSW and MD were able to follow up with the patient's wife regarding updates. Wife reports being in agreement with the patient discharging with outpatient services in place and returning home. Wife aware that LCSW has arranged [facility] services with [facility] in [facility] for patient to continue towards recovery. Intake appt has been arranged for Thursday 03/06/2022 at 10:00am. Appt instructions added in patient's AVS for his review. Wife aware that initial appt will be in [facility], however they are able to services the [facility] area as well. Wife expressed understanding and reports she will pick up the patient on Thursday morning and transport him to the appointment. No other concerns were reported by wife.   LCSW made patient aware of current plan and will touch basis with his wife. No other needs were reported at this time.   Merlynn Lazier, LCSW Clinical Social Worker [place] [facility] Ph: [phone]

## 2023-03-05 NOTE — ED Notes (Signed)
 Patient is sleeping. Respirations equal and unlabored, skin warm and dry. No change in assessment or acuity. Routine safety checks conducted according to facility protocol. Will continue to monitor for safety.

## 2023-03-05 NOTE — Group Note (Signed)
 Group Topic: Recovery Basics  Group Date: 03/05/2023 Start Time: 2030 End Time: 2100 Facilitators: Joshua Ellouise CROME  Department: [facility]  Number of Participants:   Group Focus:  Treatment Modality:   Interventions utilized were  Purpose:   Name: Evan Wilson Date of Birth: [birth year]  MR: [id number]    Level of Participation: Did not attend Quality of Participation:  Interactions with others:  Mood/Affect:  Triggers (if applicable):  Cognition:  Progress:  Response:  Plan:   Patients Problems:  Patient Active Problem List   Diagnosis Date Noted   Alcohol  use disorder, moderate, dependence (HCC) 03/01/2023   Recurrent major depressive disorder (HCC) 03/01/2023   Alcohol  abuse 03/01/2023   Rash and nonspecific skin eruption 12/13/2022   Obesity (BMI 30-39.9) 12/13/2022   Anxiety and depression 12/13/2022   Recurrent right inguinal hernia    Right inguinal hernia

## 2023-03-05 NOTE — ED Notes (Signed)
 Pt is in the dayroom watching TV with peers. Pt denies SI/HI/AVH. Pt has no further complain.No acute distress noted. Will continue to monitor for safety and provide support.

## 2023-03-05 NOTE — Group Note (Signed)
 Group Topic: Recovery Basics  Group Date: 03/05/2023 Start Time: 0800 End Time: 0830 Facilitators: Judi Monico RAMAN, NT  Department: [facility]  Number of Participants: 4  Group Focus: check in Treatment Modality:  Psychoeducation Interventions utilized were exploration, patient education, and support Purpose: express feelings and increase insight  Name: Evan Wilson Date of Birth: Apr 30, 1975  MR: [id number]    Level of Participation: minimal Quality of Participation: motivated Interactions with others: gave feedback Mood/Affect: appropriate Triggers (if applicable): n/a Cognition: coherent/clear and goal directed Progress: Gaining insight Response: Pt was able to share his goals for the day. Plan: patient will be encouraged to attend future groups.  Patients Problems:  Patient Active Problem List   Diagnosis Date Noted   Alcohol  use disorder, moderate, dependence (HCC) 03/01/2023   Recurrent major depressive disorder (HCC) 03/01/2023   Alcohol  abuse 03/01/2023   Rash and nonspecific skin eruption 12/13/2022   Obesity (BMI 30-39.9) 12/13/2022   Anxiety and depression 12/13/2022   Recurrent right inguinal hernia    Right inguinal hernia

## 2023-03-05 NOTE — Progress Notes (Signed)
 Patient cooperative with care and compliant with scheduled medications as ordered.  He is polite and gracious to staff in his interactions.  He initiates appropriate interaction with his peers.  He voices no concerns, comments this shift.  He has participated in group therapy thus far this shift.

## 2023-03-05 NOTE — Discharge Planning (Signed)
 LCSW was able to speak with patient's wife Delon [phone] to discuss current barriers for placement. LCSW informed wife that patient has been denied at [facility] in [facility] and [name] in [place] due to insurance. Wife also made aware of other two facilities that accept medicaid, however they do not accept standard medicaid either. Wife reported some concerns and frustration regarding barriers. Wife asked if the patient could be placed at a facility in [facility]. LCSW explored if patient or family has additional funds to support out of home placement and wife reports they do not as they do not have enough to even turn their water back on in the house. Wife reports she is afraid that the patient will return back home and then go right back to drinking. Wife explored if the patient has been placed on a medication that would help him abstain from drinking. LCSW informed wife that LCSW will follow up with MD to explore and advise that MD follow up with wife regarding this option. Wife reports she understands that options may be limited at this time, however she wants to explore all options to ensure the patient does not fall back in the same routine.   LCSW reached out to MD to provide an update. LCSW also attempted to get in contact with Admissions Coordinator Manuelita Passy at [facility] [phone] regarding patient's inusrance, however received no answer. LCSW left voice message requesting phone call back. LCSW to work on application with hopes the patient's insurance will be appropriate for consideration.   No other updates to provide at this time. LCSW will continue to follow and provide updates to patient while on [facility].   Merlynn Lazier, LCSW Clinical Social Worker [place] [facility] Ph: [phone]

## 2023-03-05 NOTE — ED Notes (Signed)
 Pt was provided lunch

## 2023-03-06 DIAGNOSIS — F331 Major depressive disorder, recurrent, moderate: Secondary | ICD-10-CM | POA: Diagnosis not present

## 2023-03-06 DIAGNOSIS — Z79899 Other long term (current) drug therapy: Secondary | ICD-10-CM | POA: Diagnosis not present

## 2023-03-06 DIAGNOSIS — Z1152 Encounter for screening for COVID-19: Secondary | ICD-10-CM | POA: Diagnosis not present

## 2023-03-06 DIAGNOSIS — F1010 Alcohol abuse, uncomplicated: Secondary | ICD-10-CM | POA: Diagnosis not present

## 2023-03-06 NOTE — Group Note (Signed)
 Group Topic: Mindfulness  Group Date: 03/06/2023 Start Time: 0300 End Time: 0335 Facilitators: Vanice Merlynn RAMAN, LCSW  Department: [facility]  Number of Participants: 3  Group Focus: clarity of thought, daily focus, and feeling awareness/expression Treatment Modality:  Cognitive Behavioral Therapy and Spiritual Interventions utilized were mental fitness Purpose: regain self-worth  Name: Evan Wilson Date of Birth: 09-Apr-1975  MR: [id number]    Level of Participation: active Quality of Participation: attentive and cooperative Interactions with others: gave feedback Mood/Affect: appropriate Triggers (if applicable): N/A Cognition: coherent/clear and concrete Progress: Gaining insight Description of Group:   This group will address the importance of considering the journey and not just the destination. Patients will be encouraged to process areas in their lives where they found it hard to focus on the good rather than the bad, and identify reasons for maintaining such thought pattern. Facilitator will guide patients utilizing problem- solving interventions to address and improve the way each patient views themselves and their situation. Patients will work through understanding and applying humility when it comes to life changes and the interactions we have with others. Patients will be encouraged to explore ways that they will move forward throughout life's journey, and make healthier decisions for themselves.   Therapeutic Goals: Patient will identify two or more emotions or situations that they observed or felt throughout watching the video clip. Patient will identify signs where they may have responded to someone or situation due to their current circumstance.  Patient will identify two ways to set better habits in order to achieve more peace in their lives. Patient will demonstrate ability to communicate their needs through discussion and/or role  plays.  Summary of Patient Progress: Patient actively participated in group on today. Patient reports that the video clip definitely made him reflect on his life and the importance of having things of value outside of money. Patient reports he has been able to identify that having others around him that are there to lift him up instead of tear him down has been something he has been missing. Patient reports understanding the importance of humility and realizing he cannot do life without seeking help.  Patient reports feeling encouraged from watching the video, and his was able to provide feedback to staff and peers.  Plan: referral / recommendations  Patients Problems:  Patient Active Problem List   Diagnosis Date Noted   Alcohol  use disorder, moderate, dependence (HCC) 03/01/2023   Recurrent major depressive disorder (HCC) 03/01/2023   Alcohol  abuse 03/01/2023   Rash and nonspecific skin eruption 12/13/2022   Obesity (BMI 30-39.9) 12/13/2022   Anxiety and depression 12/13/2022   Recurrent right inguinal hernia    Right inguinal hernia
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
## 2023-03-06 NOTE — Group Note (Signed)
 Group Topic: Social Support  Group Date: 03/06/2023 Start Time: 2200 End Time: 2230 Facilitators: Joan Plowman B  Department: [facility]  Number of Participants: 2  Group Focus: check in Treatment Modality:  Individual Therapy Interventions utilized were leisure development Purpose: express feelings  Name: RAYMOUND KATICH Date of Birth: [birth year]  MR: [id number]    Level of Participation: active Quality of Participation: cooperative Interactions with others: gave feedback Mood/Affect: appropriate Triggers (if applicable): NA Cognition: coherent/clear Progress: Gaining insight Response: NA Plan: patient will be encouraged to keep going to groups.   Patients Problems:  Patient Active Problem List   Diagnosis Date Noted   Alcohol  use disorder, moderate, dependence (HCC) 03/01/2023   Recurrent major depressive disorder (HCC) 03/01/2023   Alcohol  abuse 03/01/2023   Rash and nonspecific skin eruption 12/13/2022   Obesity (BMI 30-39.9) 12/13/2022   Anxiety and depression 12/13/2022   Recurrent right inguinal hernia    Right inguinal hernia

## 2023-03-06 NOTE — ED Notes (Signed)
 PRN Tylenol  given with scheduled medications due to patient reported pain 3/10 in chest. Cecily, MD has assessed patient and cleared patient for cardiac related chest pain. Medications administered with no complications. Environment secured, safety checks in place per facility policy.
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
## 2023-03-06 NOTE — ED Notes (Signed)
 Patient alert & oriented x4. Denies intent to harm self or others when asked. Denies A/VH. Patient reported chest pains earlier in shift, has now been cleared by Cecily MD of cardiac related pains. PRN medications administered due to pains, see previous note. Patient currently resting with eyes closed, respirations even and unlabored. No acute distress noted. Support and encouragement provided. Routine safety checks conducted per facility protocol. Encouraged patient to notify staff if any thoughts of harm towards self or others arise. Patient verbalizes understanding and agreement.

## 2023-03-06 NOTE — Discharge Planning (Signed)
 Patient expressed understanding of current plan at discharge. Wife will pick the patient up on tomorrow morning around 9:00am. Appt is at 10:00am in [facility]. No concerns reported by the patient. Information has been added in the patient's AVS for his review. LCSW to sign off at this time.   Please inform if further LCSW needs arise prior to discharge.  Merlynn Lazier, LCSW Clinical Social Worker [facility] Ph: [phone]

## 2023-03-06 NOTE — ED Notes (Signed)
 Pt is in the dayroom watching TV with peers. Pt denies SI/HI/AVH. Pt has no further complain.No acute distress noted. Will continue to monitor for safety and provide support.

## 2023-03-06 NOTE — ED Notes (Signed)
 Patient is sleeping. Respirations equal and unlabored, skin warm and dry. No change in assessment or acuity. Routine safety checks conducted according to facility protocol. Will continue to monitor for safety.

## 2023-03-06 NOTE — ED Notes (Addendum)
 Upon doing morning rounds patient stopped clinical research associate with complaints of chest pain described as stabbing in the L pectoral area, patient reports being SOB. Vitals 98.29F, 71, 17, 121/74 (MAP: 87), 98% on room air. Patient denies pain in shoulders, neck, arms jaws or teeth as well as dizziness, sweating, nausea, or fatigue. EKG completed showing NSR. Patient alert and oriented x4, and fully responsive. Writer kept eyes on patient throughout process as well as having additional RN staff come to assess. Patient currently in no visible acute distress, respirations even and unlabored. Writer to inform provider upon their arrival to unit. Patient informed to alert staff of any further complaints for further reassessment. Patient voiced understanding. Environment secured, safety checks in place per facility policy.  Update: [id number] - Provider Cecily, MD made aware.

## 2023-03-06 NOTE — Group Note (Signed)
 Group Topic: Feelings about Diagnosis  Group Date: 03/06/2023 Start Time: 1700 End Time: 1721 Facilitators: Brien Can B  Department: [facility]  Number of Participants: 2  Group Focus: communication and daily focus Treatment Modality:  Psychoeducation Interventions utilized were problem solving and support Purpose: express feelings and reinforce self-care  Name: Evan Wilson Date of Birth: Dec 11, 1975  MR: [id number]    Level of Participation: active Quality of Participation: attentive and cooperative Interactions with others: gave feedback Mood/Affect: positive Triggers (if applicable): n/a Cognition: coherent/clear Progress: Moderate Response: Pt stated he is grateful for his children & wife Plan: follow-up needed  Patients Problems:  Patient Active Problem List   Diagnosis Date Noted   Alcohol  use disorder, moderate, dependence (HCC) 03/01/2023   Recurrent major depressive disorder (HCC) 03/01/2023   Alcohol  abuse 03/01/2023   Rash and nonspecific skin eruption 12/13/2022   Obesity (BMI 30-39.9) 12/13/2022   Anxiety and depression 12/13/2022   Recurrent right inguinal hernia    Right inguinal hernia

## 2023-03-06 NOTE — ED Notes (Signed)
 Care for pt assumed.  Pt is awake and alert.  Flat affect and depressed mood.  Pt denies any pain, Nausea, diaphoresis or breathing issues.  He sat quietly in day room until 1:33 and asked to take a shower.  Pt given hygiene supplies. No distress noted.

## 2023-03-06 NOTE — ED Notes (Signed)
 Pt currently resting quietly in bed after eating dinner.  VSS and pt voices no complaints.Marland Kitchen

## 2023-03-06 NOTE — ED Notes (Signed)
 Patient came into hallway and waved writer to come to their room. Once in room patient reported vision changes stating they can't read that over there referring to the whiteboard on the wall facing patient, and reported that their vision was doing this while making a rotating circular motion with their hand. Patient did not further elaborate what that meant. Writer enquired if vision changes where only directly in front of patient or in periphery as well, patient reports vision is clear and focused with no abnormalities in peripheral. Cecily MD made aware. Orthostatic vitals obtained. Patient in no visible acute distress. Environment secured, safety checks in place per facility policy.

## 2023-03-07 DIAGNOSIS — F331 Major depressive disorder, recurrent, moderate: Secondary | ICD-10-CM | POA: Diagnosis not present

## 2023-03-07 DIAGNOSIS — Z1152 Encounter for screening for COVID-19: Secondary | ICD-10-CM | POA: Diagnosis not present

## 2023-03-07 DIAGNOSIS — Z79899 Other long term (current) drug therapy: Secondary | ICD-10-CM | POA: Diagnosis not present

## 2023-03-07 DIAGNOSIS — F1010 Alcohol abuse, uncomplicated: Secondary | ICD-10-CM | POA: Diagnosis not present

## 2023-03-07 MED ORDER — SERTRALINE HCL 50 MG PO TABS: 50.0000 mg | ORAL_TABLET | Freq: Every day | ORAL | 0 refills | Status: DC

## 2023-03-07 MED ORDER — VITAMIN B-1 100 MG PO TABS: 100.0000 mg | ORAL_TABLET | Freq: Every day | ORAL | 0 refills | Status: DC

## 2023-03-07 MED ORDER — NALTREXONE HCL 50 MG PO TABS: 50.0000 mg | ORAL_TABLET | Freq: Every day | ORAL | 0 refills | Status: DC

## 2023-03-07 MED ORDER — NALTREXONE HCL 50 MG PO TABS: 50.0000 mg | ORAL_TABLET | Freq: Every day | ORAL | Status: DC

## 2023-03-07 MED ORDER — VITAMIN B-1 100 MG PO TABS: 100.0000 mg | ORAL_TABLET | Freq: Every day | ORAL | Status: DC

## 2023-03-07 MED ORDER — SERTRALINE HCL 50 MG PO TABS: 50.0000 mg | ORAL_TABLET | Freq: Every day | ORAL | Status: DC

## 2023-03-07 NOTE — ED Notes (Signed)
 Pt currently sitting up in the dayroom drinking coffee. Pt  awake and alert.   Reports that he is looking forward to discharge.  Denies SI, HI or AVH.   Will cont to monitor for safety.

## 2023-03-07 NOTE — ED Notes (Signed)
 Pt was provided breakfast.

## 2023-03-07 NOTE — ED Notes (Signed)
 Patient is sleeping. Respirations equal and unlabored, skin warm and dry. No change in assessment or acuity. Routine safety checks conducted according to facility protocol. Will continue to monitor for safety.
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
## 2023-03-07 NOTE — Progress Notes (Signed)
 Pt was given back all belongings from locker #3.  Pt was given AVS and informed that his medications would be called in to preferred pharm. by Dr. Boyd. PT denied SI, HI or AVH. Pt also denied any issues or cravings related to substance abuse. Verbalized understanding of follow up with [facility].

## 2023-03-14 ENCOUNTER — Encounter: Payer: Self-pay | Admitting: Family Medicine

## 2023-03-15 ENCOUNTER — Other Ambulatory Visit: Payer: Self-pay

## 2023-03-15 DIAGNOSIS — F32A Depression, unspecified: Secondary | ICD-10-CM

## 2023-03-15 DIAGNOSIS — F1020 Alcohol dependence, uncomplicated: Secondary | ICD-10-CM

## 2023-03-15 DIAGNOSIS — F331 Major depressive disorder, recurrent, moderate: Secondary | ICD-10-CM

## 2023-03-15 DIAGNOSIS — F1010 Alcohol abuse, uncomplicated: Secondary | ICD-10-CM

## 2023-03-15 NOTE — Telephone Encounter (Signed)
Order sent.

## 2023-03-15 NOTE — Progress Notes (Signed)
Referral sent to [facility]

## 2023-03-15 NOTE — Telephone Encounter (Signed)
Please sent to [facility]

## 2023-03-19 ENCOUNTER — Other Ambulatory Visit: Payer: Self-pay | Admitting: Family Medicine

## 2023-03-20 ENCOUNTER — Encounter: Payer: Self-pay | Admitting: Family Medicine

## 2023-03-21 ENCOUNTER — Other Ambulatory Visit: Payer: Self-pay

## 2023-03-21 ENCOUNTER — Other Ambulatory Visit: Payer: Self-pay | Admitting: Family Medicine

## 2023-03-21 MED ORDER — SERTRALINE HCL 50 MG PO TABS: 50.0000 mg | ORAL_TABLET | Freq: Every day | ORAL | 1 refills | Status: DC

## 2023-03-21 MED ORDER — NALTREXONE HCL 50 MG PO TABS: 50.0000 mg | ORAL_TABLET | Freq: Every day | ORAL | 1 refills | Status: DC

## 2023-03-21 MED ORDER — MIRTAZAPINE 15 MG PO TABS: 15.0000 mg | ORAL_TABLET | Freq: Every day | ORAL | 2 refills | Status: DC

## 2023-03-21 NOTE — Telephone Encounter (Signed)
sent 

## 2023-03-21 NOTE — Telephone Encounter (Signed)
Please confirm what medication he needs refill on

## 2023-03-22 NOTE — Telephone Encounter (Signed)
Pt. Notified of bridge prescription w/ refill

## 2023-04-25 ENCOUNTER — Other Ambulatory Visit: Payer: Self-pay | Admitting: Family Medicine

## 2023-05-07 ENCOUNTER — Encounter: Payer: Self-pay | Admitting: Family Medicine

## 2023-05-07 ENCOUNTER — Other Ambulatory Visit: Payer: Self-pay | Admitting: Family Medicine

## 2023-05-07 NOTE — Telephone Encounter (Signed)
 Copied from CRM [id number]. Topic: Clinical - Medication Refill >> May 07, 2023 12:02 PM Shelah Lewandowsky wrote: Most Recent Primary Care Visit:  Provider: Reuel Boom  Department: [facility] CARE  Visit Type: [facility] MYCHART VV  Date: 12/27/2022  Medication: gabapentin (NEURONTIN) 300 MG capsule  Has the patient contacted their pharmacy? Yes (Agent: If no, request that the patient contact the pharmacy for the refill. If patient does not wish to contact the pharmacy document the reason why and proceed with request.) (Agent: If yes, when and what did the pharmacy advise?)  Is this the correct pharmacy for this prescription? Yes If no, delete pharmacy and type the correct one.  This is the patient's preferred pharmacy:  [id number] [place], [place] - [address] AT [facility] [address] [facility] [address] [place] [address] Phone: [phone] Fax: [phone]   Has the prescription been filled recently? Yes  Is the patient out of the medication? Yes  Has the patient been seen for an appointment in the last year OR does the patient have an upcoming appointment? No  Can we respond through MyChart? Yes  Agent: Please be advised that Rx refills may take up to 3 business days. We ask that you follow-up with your pharmacy.

## 2023-05-08 ENCOUNTER — Other Ambulatory Visit: Payer: Self-pay | Admitting: Family Medicine

## 2023-05-08 MED ORDER — GABAPENTIN 300 MG PO CAPS: 600.0000 mg | ORAL_CAPSULE | Freq: Two times a day (BID) | ORAL | 2 refills | Status: DC

## 2023-05-08 NOTE — Telephone Encounter (Signed)
Which [facility]?

## 2023-05-13 ENCOUNTER — Encounter (HOSPITAL_COMMUNITY): Payer: Self-pay | Admitting: Registered Nurse

## 2023-05-13 ENCOUNTER — Ambulatory Visit (INDEPENDENT_AMBULATORY_CARE_PROVIDER_SITE_OTHER): Payer: Medicaid Other | Admitting: Registered Nurse

## 2023-05-13 ENCOUNTER — Telehealth: Payer: Self-pay | Admitting: Family Medicine

## 2023-05-13 VITALS — BP 135/86 | HR 68 | Ht 71.0 in | Wt 200.4 lb

## 2023-05-13 DIAGNOSIS — F411 Generalized anxiety disorder: Secondary | ICD-10-CM | POA: Diagnosis not present

## 2023-05-13 DIAGNOSIS — F4310 Post-traumatic stress disorder, unspecified: Secondary | ICD-10-CM

## 2023-05-13 DIAGNOSIS — F331 Major depressive disorder, recurrent, moderate: Secondary | ICD-10-CM

## 2023-05-13 DIAGNOSIS — F1011 Alcohol abuse, in remission: Secondary | ICD-10-CM

## 2023-05-13 MED ORDER — SERTRALINE HCL 25 MG PO TABS
25.0000 mg | ORAL_TABLET | Freq: Every day | ORAL | 2 refills | Status: DC
Start: 2023-05-13 — End: 2023-07-11

## 2023-05-13 MED ORDER — MIRTAZAPINE 15 MG PO TABS: 15.0000 mg | ORAL_TABLET | Freq: Every day | ORAL | 2 refills | Status: DC

## 2023-05-13 MED ORDER — SERTRALINE HCL 50 MG PO TABS: 50.0000 mg | ORAL_TABLET | Freq: Every day | ORAL | 2 refills | Status: DC

## 2023-05-13 MED ORDER — NALTREXONE HCL 50 MG PO TABS: 50.0000 mg | ORAL_TABLET | Freq: Every day | ORAL | 2 refills | Status: DC

## 2023-05-13 NOTE — Telephone Encounter (Signed)
 Patient came by  office wants to know why gabapentin (NEURONTIN) 300 MG capsule  Was decreased from 6 times daily to 2 times daily  Patient wants a call back
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
## 2023-06-10 ENCOUNTER — Telehealth (HOSPITAL_COMMUNITY): Payer: Self-pay | Admitting: *Deleted

## 2023-06-10 NOTE — Telephone Encounter (Signed)
 Patient called stating his Mirtazapine is no longer working. Per pt he is not getting any sold sleep. Per pt he wakes up several times during the night. [phone]

## 2023-06-12 ENCOUNTER — Other Ambulatory Visit (HOSPITAL_COMMUNITY): Payer: Self-pay | Admitting: Registered Nurse

## 2023-06-12 ENCOUNTER — Encounter (HOSPITAL_COMMUNITY): Payer: Self-pay | Admitting: Registered Nurse

## 2023-06-12 DIAGNOSIS — G4700 Insomnia, unspecified: Secondary | ICD-10-CM

## 2023-06-12 DIAGNOSIS — F4310 Post-traumatic stress disorder, unspecified: Secondary | ICD-10-CM

## 2023-06-12 DIAGNOSIS — F331 Major depressive disorder, recurrent, moderate: Secondary | ICD-10-CM

## 2023-06-12 MED ORDER — MIRTAZAPINE 30 MG PO TABS
30.0000 mg | ORAL_TABLET | Freq: Every day | ORAL | 0 refills | Status: DC
Start: 2023-06-12 — End: 2023-07-11

## 2023-06-12 NOTE — Telephone Encounter (Cosign Needed)
 MRN [id number]   Evan Wilson 48 y.o. male patient seen at [facility] [place] [facility] [place] for medication management was last seen 05/13/2023.  His next scheduled visit is 07/11/2023.  Merribeth Abbott called reporting that Mirtazapine is no longer working and not getting any solid sleep waking up several times during the night.   Mirtazapine increased to 30 mg Q hs.  He can keep his scheduled appointment on 07/11/2023.  Prescription sent to [facility]  on [address] [place].  If more changes are needed he will need to come in office to be seen prior to scheduled visit.    Jatniel Verastegui B. Szymon Foiles, NP

## 2023-06-17 ENCOUNTER — Encounter (INDEPENDENT_AMBULATORY_CARE_PROVIDER_SITE_OTHER): Payer: Self-pay | Admitting: *Deleted

## 2023-06-18 NOTE — Telephone Encounter (Signed)
 Patient aware.

## 2023-07-11 ENCOUNTER — Encounter (HOSPITAL_COMMUNITY): Payer: Self-pay | Admitting: Registered Nurse

## 2023-07-11 ENCOUNTER — Telehealth (INDEPENDENT_AMBULATORY_CARE_PROVIDER_SITE_OTHER): Payer: MEDICAID | Admitting: Registered Nurse

## 2023-07-11 DIAGNOSIS — F4310 Post-traumatic stress disorder, unspecified: Secondary | ICD-10-CM

## 2023-07-11 DIAGNOSIS — F411 Generalized anxiety disorder: Secondary | ICD-10-CM | POA: Diagnosis not present

## 2023-07-11 DIAGNOSIS — F331 Major depressive disorder, recurrent, moderate: Secondary | ICD-10-CM | POA: Diagnosis not present

## 2023-07-11 DIAGNOSIS — F1011 Alcohol abuse, in remission: Secondary | ICD-10-CM | POA: Diagnosis not present

## 2023-07-11 DIAGNOSIS — G4700 Insomnia, unspecified: Secondary | ICD-10-CM

## 2023-07-11 MED ORDER — NALTREXONE HCL 50 MG PO TABS: 50.0000 mg | ORAL_TABLET | Freq: Every day | ORAL | 1 refills | Status: DC

## 2023-07-11 MED ORDER — SERTRALINE HCL 100 MG PO TABS: 100.0000 mg | ORAL_TABLET | Freq: Every day | ORAL | 1 refills | Status: DC

## 2023-07-11 MED ORDER — MIRTAZAPINE 7.5 MG PO TABS: 30.0000 mg | ORAL_TABLET | Freq: Every day | ORAL | 1 refills | Status: DC
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
## 2023-07-11 NOTE — Patient Instructions (Addendum)
 Tips for Effective Medication Management at Home:  follow these tips to avoid errors and receive the full benefit of medications. 1. Start by creating a detailed list of all medications you're taking   Medication name: Note both the medication's generic name and brand name(s) to avoid confusion.  Example "Levothyroxine" or the brand name "Synthroid").   Dosage: writing down the dose you're prescribed in terms of milligrams.  or whatever unit the medication is measured in) and noting what that dosage looks like for you (e.g., one tablet, half a syringe, and so on). Frequency and timing: how often you are supposed to take the medication and, For example, you may need to take a medication with breakfast and again with dinner, or you may need to administer a dose every six hours.  Administration instructions: Write down instructions from your doctor or pharmacist regarding the correct way to administer the drug. For instance, you may need to administer an injection in your thigh or take a certain pill on an empty stomach. Purpose and prescribing doctor: write down who prescribed each medication and its intended purpose (e.g., "lowers cholesterol" or "blood thinner").    2. Use pill organizers. Pill organizers offer a simple solution for staying on top of your medication regimen and preventing that common question: Did I take that pill earlier, or did I forget? Plus, pill organizers make taking your medications on the go easier. Consider buying an Dance movement psychotherapist with separate compartments for each day so you can easily detach and take that day's pills before you leave the house.   3. Establish habits.   The longer you follow a medication regimen, the more routine and familiar it will become. However, you can speed up that process by proactively establishing habits that make remembering to take your medication easier.    Try linking medication administration with existing habits or daily routines to create a  natural rhythm you can easily follow. For example, you may keep a medication that should be taken on an empty stomach on your nightstand and take it every morning before your feet ever hit the floor. Or you may remember to take a twice-daily medication by taking it when you brush your teeth in the morning and again when you brush your teeth before bed. You might remember your daily multivitamin by taking it each morning with your coffee. Create a habit that feels natural, and you'll fall into a consistent routine without expending too much mental effort.   Call 911, [phone], mobile crisis, or present to the nearest emergency room should you experience any suicidal/homicidal ideation, auditory/visual/hallucinations, or detrimental worsening of your mental health.  Mobile Crisis Response Teams Listed by counties in vicinity of [facility] providers [facility] [phone] [facility] [phone] [facility] [phone] [facility] [phone] [place]                * [facility] [phone]                * [employer] [phone]  [facility] [phone] [facility] [employer]  [phone] * [employer] [phone]

## 2023-07-29 ENCOUNTER — Telehealth (HOSPITAL_COMMUNITY): Payer: Self-pay | Admitting: *Deleted

## 2023-07-29 ENCOUNTER — Other Ambulatory Visit (HOSPITAL_COMMUNITY): Payer: Self-pay | Admitting: Registered Nurse

## 2023-07-29 DIAGNOSIS — G4700 Insomnia, unspecified: Secondary | ICD-10-CM

## 2023-07-29 DIAGNOSIS — F331 Major depressive disorder, recurrent, moderate: Secondary | ICD-10-CM

## 2023-07-29 DIAGNOSIS — F4310 Post-traumatic stress disorder, unspecified: Secondary | ICD-10-CM

## 2023-07-29 MED ORDER — MIRTAZAPINE 7.5 MG PO TABS: 30.0000 mg | ORAL_TABLET | Freq: Every day | ORAL | 0 refills | Status: DC

## 2023-07-29 NOTE — Telephone Encounter (Signed)
 Rx Request [id number] -  [place], [place] - [address]    mirtazapine  (REMERON ) 7.5 MG tablet  last fill --  07/11/23

## 2023-08-09 ENCOUNTER — Encounter: Payer: Self-pay | Admitting: Podiatry

## 2023-08-09 ENCOUNTER — Ambulatory Visit (INDEPENDENT_AMBULATORY_CARE_PROVIDER_SITE_OTHER): Payer: MEDICAID | Admitting: Podiatry

## 2023-08-09 ENCOUNTER — Ambulatory Visit (INDEPENDENT_AMBULATORY_CARE_PROVIDER_SITE_OTHER): Payer: MEDICAID

## 2023-08-09 DIAGNOSIS — M7671 Peroneal tendinitis, right leg: Secondary | ICD-10-CM | POA: Diagnosis not present

## 2023-08-09 DIAGNOSIS — M7752 Other enthesopathy of left foot: Secondary | ICD-10-CM

## 2023-08-09 DIAGNOSIS — M7672 Peroneal tendinitis, left leg: Secondary | ICD-10-CM | POA: Diagnosis not present

## 2023-08-09 DIAGNOSIS — M7751 Other enthesopathy of right foot: Secondary | ICD-10-CM | POA: Diagnosis not present

## 2023-08-09 DIAGNOSIS — M722 Plantar fascial fibromatosis: Secondary | ICD-10-CM

## 2023-08-09 DIAGNOSIS — M778 Other enthesopathies, not elsewhere classified: Secondary | ICD-10-CM

## 2023-08-09 MED ORDER — TRIAMCINOLONE ACETONIDE 10 MG/ML IJ SUSP
10.0000 mg | Freq: Once | INTRAMUSCULAR | Status: AC
  Administered 2023-08-09: 10 mg

## 2023-08-09 NOTE — Progress Notes (Signed)
 Complaint of pain at the plantar aspect of the heels bilaterally.  Worse on the right than on the left on the left foot also has pain along the course of the peroneal tendon at the fifth metatarsal base.  Pain first thing the morning when he stands up or after resting for a while and standing up.  Has not noticed any redness or ecchymosis.  Does not recall any specific injury.   Physical exam:  General appearance: Pleasant, and in no acute distress. AOx3.  Vascular: Pedal pulses: DP 2/4 bilaterally, PT 2/4 bilaterally.  No edema lower legs bilaterally. Capillary fill time immediate.  Neurological: Light touch intact feet bilaterally.  Normal Achilles reflex bilaterally.  No clonus or spasticity noted.  Negative Tinel's sign tarsal tunnel and porta pedis bilaterally  Dermatologic:   Skin normal temperature bilaterally.  Skin normal color, tone, and texture bilaterally.   Musculoskeletal: At the plantar medial aspects of the calcaneus bilaterally.  Tenderness along the proximal one half of the plantar fascial band bilaterally.  Tenderness along the peroneus brevis tendon from the tubercle to its insertion at the fifth metatarsal base.  Tenderness in the sinus tarsi on the left with palpation.  No tenderness with range of motion subtalar joint or ankle joint.  Radiographs: 3 views right foot: Osteophytic changes atthe plantar calcaneus and posterior calcaneus.  Status post removal middle section of fibula for bone graft and is JAP.  No evidence any fractures or dislocations.  Normal bone density.  No evidence any tumors  3 views left foot: Osteophytic changes plantar and posterior aspect calcaneus.  No erosive changes or evidence of stress fractures.  Normal bone density no other evidence of fractures in the foot.  No evidence any tumors  Diagnosis: 1.  Peroneal tendinitis bilaterally. 2.  Plantar fasciitis bilaterally. 3.  Capsulitis bilaterally  Plan: -New patient visit level 3 for  evaluation and management. - Discussed plantar fasciitis and the resulting peroneal tendinitis bilaterally.  Discussed treatment and discussed proper shoes.  Will give him exercises to do for stretching and icing.  -injected 3cc 2:1 mixture 0.5 cc Marcaine :Kenolog 10mg /75ml at plantar fascial origin medial heel right -Dispensed OTC insoles for shoes  Return 1 week follow-up injection

## 2023-08-09 NOTE — Patient Instructions (Addendum)
.  tfc  

## 2023-08-16 ENCOUNTER — Ambulatory Visit: Admitting: Podiatry

## 2023-08-21 ENCOUNTER — Emergency Department (HOSPITAL_COMMUNITY): Payer: MEDICAID

## 2023-08-21 ENCOUNTER — Ambulatory Visit (INDEPENDENT_AMBULATORY_CARE_PROVIDER_SITE_OTHER)
Admission: EM | Admit: 2023-08-21 | Discharge: 2023-08-21 | Disposition: A | Discharge status: Other Acute Inpatient Hospital | Payer: MEDICAID | Source: Home / Self Care

## 2023-08-21 ENCOUNTER — Encounter (HOSPITAL_COMMUNITY): Payer: Self-pay

## 2023-08-21 ENCOUNTER — Other Ambulatory Visit: Payer: Self-pay

## 2023-08-21 ENCOUNTER — Telehealth: Payer: Self-pay | Admitting: *Deleted

## 2023-08-21 ENCOUNTER — Inpatient Hospital Stay
Admission: AD | Admit: 2023-08-21 | Discharge: 2023-08-31 | DRG: 885 | Disposition: A | Discharge status: Home / Self Care | Payer: MEDICAID | Source: Other Acute Inpatient Hospital | Attending: Psychiatry | Admitting: Psychiatry

## 2023-08-21 ENCOUNTER — Encounter: Payer: Self-pay | Admitting: Family Medicine

## 2023-08-21 ENCOUNTER — Emergency Department (HOSPITAL_COMMUNITY)
Admission: EM | Admit: 2023-08-21 | Discharge: 2023-08-21 | Disposition: A | Discharge status: Home / Self Care | Payer: MEDICAID

## 2023-08-21 DIAGNOSIS — M25561 Pain in right knee: Secondary | ICD-10-CM | POA: Insufficient documentation

## 2023-08-21 DIAGNOSIS — Z888 Allergy status to other drugs, medicaments and biological substances status: Secondary | ICD-10-CM

## 2023-08-21 DIAGNOSIS — F10239 Alcohol dependence with withdrawal, unspecified: Secondary | ICD-10-CM | POA: Diagnosis present

## 2023-08-21 DIAGNOSIS — F1020 Alcohol dependence, uncomplicated: Secondary | ICD-10-CM | POA: Diagnosis not present

## 2023-08-21 DIAGNOSIS — Z885 Allergy status to narcotic agent status: Secondary | ICD-10-CM | POA: Diagnosis not present

## 2023-08-21 DIAGNOSIS — Z604 Social exclusion and rejection: Secondary | ICD-10-CM | POA: Diagnosis present

## 2023-08-21 DIAGNOSIS — S8391XA Sprain of unspecified site of right knee, initial encounter: Secondary | ICD-10-CM

## 2023-08-21 DIAGNOSIS — F323 Major depressive disorder, single episode, severe with psychotic features: Secondary | ICD-10-CM | POA: Diagnosis present

## 2023-08-21 DIAGNOSIS — F322 Major depressive disorder, single episode, severe without psychotic features: Secondary | ICD-10-CM | POA: Diagnosis not present

## 2023-08-21 DIAGNOSIS — Z818 Family history of other mental and behavioral disorders: Secondary | ICD-10-CM

## 2023-08-21 DIAGNOSIS — F333 Major depressive disorder, recurrent, severe with psychotic symptoms: Secondary | ICD-10-CM | POA: Diagnosis present

## 2023-08-21 DIAGNOSIS — S0232XD Fracture of orbital floor, left side, subsequent encounter for fracture with routine healing: Secondary | ICD-10-CM | POA: Diagnosis not present

## 2023-08-21 DIAGNOSIS — F332 Major depressive disorder, recurrent severe without psychotic features: Secondary | ICD-10-CM | POA: Diagnosis not present

## 2023-08-21 DIAGNOSIS — Z23 Encounter for immunization: Secondary | ICD-10-CM | POA: Diagnosis not present

## 2023-08-21 DIAGNOSIS — W01198A Fall on same level from slipping, tripping and stumbling with subsequent striking against other object, initial encounter: Secondary | ICD-10-CM | POA: Insufficient documentation

## 2023-08-21 DIAGNOSIS — F419 Anxiety disorder, unspecified: Secondary | ICD-10-CM | POA: Diagnosis present

## 2023-08-21 DIAGNOSIS — E785 Hyperlipidemia, unspecified: Secondary | ICD-10-CM | POA: Diagnosis present

## 2023-08-21 DIAGNOSIS — Z9151 Personal history of suicidal behavior: Secondary | ICD-10-CM

## 2023-08-21 DIAGNOSIS — F4310 Post-traumatic stress disorder, unspecified: Secondary | ICD-10-CM | POA: Diagnosis present

## 2023-08-21 DIAGNOSIS — S01112A Laceration without foreign body of left eyelid and periocular area, initial encounter: Secondary | ICD-10-CM | POA: Insufficient documentation

## 2023-08-21 DIAGNOSIS — S0993XA Unspecified injury of face, initial encounter: Secondary | ICD-10-CM | POA: Diagnosis present

## 2023-08-21 DIAGNOSIS — W01198D Fall on same level from slipping, tripping and stumbling with subsequent striking against other object, subsequent encounter: Secondary | ICD-10-CM | POA: Diagnosis present

## 2023-08-21 DIAGNOSIS — Z79899 Other long term (current) drug therapy: Secondary | ICD-10-CM | POA: Diagnosis not present

## 2023-08-21 DIAGNOSIS — Y92010 Kitchen of single-family (private) house as the place of occurrence of the external cause: Secondary | ICD-10-CM | POA: Diagnosis not present

## 2023-08-21 DIAGNOSIS — Y92000 Kitchen of unspecified non-institutional (private) residence as  the place of occurrence of the external cause: Secondary | ICD-10-CM | POA: Insufficient documentation

## 2023-08-21 DIAGNOSIS — F32A Suicidal ideations: Secondary | ICD-10-CM

## 2023-08-21 DIAGNOSIS — S0181XD Laceration without foreign body of other part of head, subsequent encounter: Secondary | ICD-10-CM | POA: Diagnosis not present

## 2023-08-21 DIAGNOSIS — R45851 Suicidal ideations: Secondary | ICD-10-CM | POA: Diagnosis not present

## 2023-08-21 DIAGNOSIS — S0232XA Fracture of orbital floor, left side, initial encounter for closed fracture: Secondary | ICD-10-CM

## 2023-08-21 DIAGNOSIS — Z823 Family history of stroke: Secondary | ICD-10-CM | POA: Diagnosis not present

## 2023-08-21 DIAGNOSIS — F250 Schizoaffective disorder, bipolar type: Secondary | ICD-10-CM | POA: Diagnosis present

## 2023-08-21 DIAGNOSIS — F1010 Alcohol abuse, uncomplicated: Secondary | ICD-10-CM | POA: Diagnosis present

## 2023-08-21 DIAGNOSIS — Z8249 Family history of ischemic heart disease and other diseases of the circulatory system: Secondary | ICD-10-CM

## 2023-08-21 DIAGNOSIS — S0181XA Laceration without foreign body of other part of head, initial encounter: Secondary | ICD-10-CM

## 2023-08-21 LAB — LIPID PANEL
Cholesterol: 149 mg/dL (ref 0–200)
HDL: 56 mg/dL (ref >40)
LDL Cholesterol: 84 mg/dL (ref 0–99)
Total CHOL/HDL Ratio: 2.7 ratio
Triglycerides: 47 mg/dL (ref <150)
VLDL: 9 mg/dL (ref 0–40)

## 2023-08-21 LAB — COMPREHENSIVE METABOLIC PANEL WITH GFR
ALT: 55 U/L — ABNORMAL HIGH (ref 0–44)
AST: 104 U/L — ABNORMAL HIGH (ref 15–41)
Albumin: 3.1 g/dL — ABNORMAL LOW (ref 3.5–5.0)
Alkaline Phosphatase: 100 U/L (ref 38–126)
Anion gap: 6 (ref 5–15)
BUN: 7 mg/dL (ref 6–20)
CO2: 27 mmol/L (ref 22–32)
Calcium: 8.5 mg/dL — ABNORMAL LOW (ref 8.9–10.3)
Chloride: 106 mmol/L (ref 98–111)
Creatinine, Ser: 0.8 mg/dL (ref 0.61–1.24)
GFR, Estimated: >60 mL/min (ref >60)
Glucose, Bld: 105 mg/dL — ABNORMAL HIGH (ref 70–99)
Potassium: 4.5 mmol/L (ref 3.5–5.1)
Sodium: 139 mmol/L (ref 135–145)
Total Bilirubin: 1.9 mg/dL — ABNORMAL HIGH (ref 0.0–1.2)
Total Protein: 7.1 g/dL (ref 6.5–8.1)

## 2023-08-21 LAB — CBC WITH DIFFERENTIAL/PLATELET
Abs Immature Granulocytes: 0.03 10*3/uL (ref 0.00–0.07)
Basophils Absolute: 0.1 10*3/uL (ref 0.0–0.1)
Basophils Relative: 1 %
Eosinophils Absolute: 0.1 10*3/uL (ref 0.0–0.5)
Eosinophils Relative: 1 %
HCT: 35.4 % — ABNORMAL LOW (ref 39.0–52.0)
Hemoglobin: 11.6 g/dL — ABNORMAL LOW (ref 13.0–17.0)
Immature Granulocytes: 0 %
Lymphocytes Relative: 16 %
Lymphs Abs: 1.6 10*3/uL (ref 0.7–4.0)
MCH: 29.7 pg (ref 26.0–34.0)
MCHC: 32.8 g/dL (ref 30.0–36.0)
MCV: 90.5 fL (ref 80.0–100.0)
Monocytes Absolute: 1.2 10*3/uL — ABNORMAL HIGH (ref 0.1–1.0)
Monocytes Relative: 13 %
Neutro Abs: 6.7 10*3/uL (ref 1.7–7.7)
Neutrophils Relative %: 69 %
Platelets: 240 10*3/uL (ref 150–400)
RBC: 3.91 MIL/uL — ABNORMAL LOW (ref 4.22–5.81)
RDW: 16.5 % — ABNORMAL HIGH (ref 11.5–15.5)
WBC: 9.7 10*3/uL (ref 4.0–10.5)
nRBC: 0 % (ref 0.0–0.2)

## 2023-08-21 LAB — TSH: TSH: 0.812 u[IU]/mL (ref 0.350–4.500)

## 2023-08-21 LAB — HEMOGLOBIN A1C
Hgb A1c MFr Bld: 4.4 % — ABNORMAL LOW (ref 4.8–5.6)
Mean Plasma Glucose: 79.58 mg/dL

## 2023-08-21 LAB — ETHANOL: Alcohol, Ethyl (B): <15 mg/dL (ref <15)

## 2023-08-21 LAB — POCT URINE DRUG SCREEN - MANUAL ENTRY (I-SCREEN)
POC Amphetamine UR: NOT DETECTED
POC Buprenorphine (BUP): NOT DETECTED
POC Cocaine UR: NOT DETECTED
POC Marijuana UR: NOT DETECTED
POC Methadone UR: NOT DETECTED
POC Methamphetamine UR: NOT DETECTED
POC Morphine: NOT DETECTED
POC Oxazepam (BZO): POSITIVE — AB
POC Oxycodone UR: NOT DETECTED
POC Secobarbital (BAR): NOT DETECTED

## 2023-08-21 LAB — VITAMIN D 25 HYDROXY (VIT D DEFICIENCY, FRACTURES): Vit D, 25-Hydroxy: 40.14 ng/mL (ref 30–100)

## 2023-08-21 MED ORDER — HALOPERIDOL 5 MG PO TABS: 5.0000 mg | ORAL_TABLET | Freq: Three times a day (TID) | ORAL | Status: DC | PRN

## 2023-08-21 MED ORDER — HALOPERIDOL LACTATE 5 MG/ML IJ SOLN: 10.0000 mg | Freq: Three times a day (TID) | INTRAMUSCULAR | Status: DC | PRN

## 2023-08-21 MED ORDER — ACETAMINOPHEN 325 MG PO TABS: 650.0000 mg | ORAL_TABLET | Freq: Four times a day (QID) | ORAL | Status: DC | PRN

## 2023-08-21 MED ORDER — DIPHENHYDRAMINE HCL 50 MG PO CAPS: 50.0000 mg | ORAL_CAPSULE | Freq: Three times a day (TID) | ORAL | Status: DC | PRN

## 2023-08-21 MED ORDER — OXYCODONE-ACETAMINOPHEN 5-325 MG PO TABS: 1.0000 | ORAL_TABLET | Freq: Three times a day (TID) | ORAL | Status: DC | PRN

## 2023-08-21 MED ORDER — DIPHENHYDRAMINE HCL 50 MG/ML IJ SOLN: 50.0000 mg | Freq: Three times a day (TID) | INTRAMUSCULAR | Status: DC | PRN

## 2023-08-21 MED ORDER — SERTRALINE HCL 100 MG PO TABS
100.0000 mg | ORAL_TABLET | Freq: Every day | ORAL | Status: DC
  Administered 2023-08-21: 100 mg via ORAL
  Filled 2023-08-21: qty 1

## 2023-08-21 MED ORDER — ADULT MULTIVITAMIN W/MINERALS CH
1.0000 | ORAL_TABLET | Freq: Every day | ORAL | Status: DC
  Administered 2023-08-22 – 2023-08-31 (×10): 1 via ORAL
  Filled 2023-08-21 (×10): qty 1

## 2023-08-21 MED ORDER — THIAMINE MONONITRATE 100 MG PO TABS: 100.0000 mg | ORAL_TABLET | Freq: Every day | ORAL | Status: DC

## 2023-08-21 MED ORDER — OLANZAPINE 5 MG PO TBDP
5.0000 mg | ORAL_TABLET | Freq: Two times a day (BID) | ORAL | Status: DC
  Administered 2023-08-21: 5 mg via ORAL
  Filled 2023-08-21: qty 1

## 2023-08-21 MED ORDER — HALOPERIDOL LACTATE 5 MG/ML IJ SOLN: 5.0000 mg | Freq: Three times a day (TID) | INTRAMUSCULAR | Status: DC | PRN

## 2023-08-21 MED ORDER — LORAZEPAM 1 MG PO TABS: 1.0000 mg | ORAL_TABLET | Freq: Four times a day (QID) | ORAL | Status: AC | PRN

## 2023-08-21 MED ORDER — ONDANSETRON 4 MG PO TBDP: 4.0000 mg | ORAL_TABLET | Freq: Four times a day (QID) | ORAL | Status: DC | PRN

## 2023-08-21 MED ORDER — LORAZEPAM 1 MG PO TABS: 1.0000 mg | ORAL_TABLET | Freq: Two times a day (BID) | ORAL | Status: DC

## 2023-08-21 MED ORDER — MAGNESIUM HYDROXIDE 400 MG/5ML PO SUSP: 30.0000 mL | Freq: Every day | ORAL | Status: DC | PRN

## 2023-08-21 MED ORDER — HYDROXYZINE HCL 25 MG PO TABS: 25.0000 mg | ORAL_TABLET | Freq: Four times a day (QID) | ORAL | Status: DC | PRN

## 2023-08-21 MED ORDER — LORAZEPAM 2 MG/ML IJ SOLN: 2.0000 mg | Freq: Three times a day (TID) | INTRAMUSCULAR | Status: DC | PRN

## 2023-08-21 MED ORDER — ALUM & MAG HYDROXIDE-SIMETH 200-200-20 MG/5ML PO SUSP: 30.0000 mL | ORAL | Status: DC | PRN

## 2023-08-21 MED ORDER — DIPHENHYDRAMINE HCL 25 MG PO CAPS: 50.0000 mg | ORAL_CAPSULE | Freq: Three times a day (TID) | ORAL | Status: DC | PRN

## 2023-08-21 MED ORDER — LOPERAMIDE HCL 2 MG PO CAPS: 2.0000 mg | ORAL_CAPSULE | ORAL | Status: AC | PRN

## 2023-08-21 MED ORDER — THIAMINE MONONITRATE 100 MG PO TABS
100.0000 mg | ORAL_TABLET | Freq: Every day | ORAL | Status: DC
  Administered 2023-08-22 – 2023-08-31 (×10): 100 mg via ORAL
  Filled 2023-08-21 (×10): qty 1

## 2023-08-21 MED ORDER — LORAZEPAM 1 MG PO TABS: 1.0000 mg | ORAL_TABLET | Freq: Four times a day (QID) | ORAL | Status: DC | PRN

## 2023-08-21 MED ORDER — THIAMINE HCL 100 MG/ML IJ SOLN
100.0000 mg | Freq: Once | INTRAMUSCULAR | Status: AC
  Administered 2023-08-21: 100 mg via INTRAMUSCULAR
  Filled 2023-08-21: qty 2

## 2023-08-21 MED ORDER — GABAPENTIN 300 MG PO CAPS: 600.0000 mg | ORAL_CAPSULE | Freq: Two times a day (BID) | ORAL | Status: DC

## 2023-08-21 MED ORDER — LORAZEPAM 1 MG PO TABS
1.0000 mg | ORAL_TABLET | Freq: Every day | ORAL | Status: DC
Start: 2023-08-25 — End: 2023-08-21

## 2023-08-21 MED ORDER — ONDANSETRON 4 MG PO TBDP: 4.0000 mg | ORAL_TABLET | Freq: Four times a day (QID) | ORAL | Status: AC | PRN

## 2023-08-21 MED ORDER — ADULT MULTIVITAMIN W/MINERALS CH
1.0000 | ORAL_TABLET | Freq: Every day | ORAL | Status: DC
  Administered 2023-08-21: 1 via ORAL
  Filled 2023-08-21: qty 1

## 2023-08-21 MED ORDER — HYDROXYZINE HCL 25 MG PO TABS
25.0000 mg | ORAL_TABLET | Freq: Four times a day (QID) | ORAL | Status: AC | PRN
  Administered 2023-08-23 – 2023-08-24 (×2): 25 mg via ORAL
  Filled 2023-08-21 (×3): qty 1

## 2023-08-21 MED ORDER — LOPERAMIDE HCL 2 MG PO CAPS: 2.0000 mg | ORAL_CAPSULE | ORAL | Status: DC | PRN

## 2023-08-21 MED ORDER — OXYCODONE HCL 5 MG PO TABS: 5.0000 mg | ORAL_TABLET | Freq: Four times a day (QID) | ORAL | 0 refills | Status: DC | PRN

## 2023-08-21 MED ORDER — OXYCODONE-ACETAMINOPHEN 5-325 MG PO TABS
1.0000 | ORAL_TABLET | ORAL | Status: DC | PRN
  Administered 2023-08-21: 1 via ORAL
  Filled 2023-08-21: qty 1

## 2023-08-21 MED ORDER — ADULT MULTIVITAMIN W/MINERALS CH: 1.0000 | ORAL_TABLET | Freq: Every day | ORAL | Status: DC

## 2023-08-21 MED ORDER — LORAZEPAM 1 MG PO TABS: 1.0000 mg | ORAL_TABLET | Freq: Three times a day (TID) | ORAL | Status: DC

## 2023-08-21 MED ORDER — LORAZEPAM 1 MG PO TABS
1.0000 mg | ORAL_TABLET | Freq: Four times a day (QID) | ORAL | Status: DC
Start: 2023-08-21 — End: 2023-08-21
  Administered 2023-08-21: 1 mg via ORAL
  Filled 2023-08-21: qty 1
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
## 2023-08-21 NOTE — ED Triage Notes (Signed)
 Pt slipped on floor fell, hit face on granite counter top. Pt has swelling to left side of face. And a small lac above eyebrow. Pt also twisted right knee and there is swelling. Pt is able to ambulate. Not on blood thinners. Denies headache. No LOC

## 2023-08-21 NOTE — ED Notes (Signed)
 142/78 Manual BP taken
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
## 2023-08-21 NOTE — ED Notes (Signed)
 ORtho called for brace

## 2023-08-21 NOTE — Progress Notes (Signed)
 Pt was accepted to [facility] 08/21/2023 Bed assignment: 309   Pt meets inpatient criteria per Suzen Lesches, NP   Attending Physician will be: Dr.Jadepalle    Report can be called to: [phone]  Pt can arrive pending labs   Care Team Notified: [facility] Cherylynn Carlo Suzen Lesches, NP

## 2023-08-21 NOTE — ED Notes (Signed)
 Transported to CT

## 2023-08-21 NOTE — Progress Notes (Signed)
 Orthopedic Tech Progress Note Patient Details:  Evan Wilson April 06, 1975 [id number]  Ortho Devices Type of Ortho Device: Knee Sleeve Ortho Device/Splint Location: Right knee Ortho Device/Splint Interventions: Application   Post Interventions Patient Tolerated: Well  [name] Evan Wilson 08/21/2023, 12:18 PM

## 2023-08-21 NOTE — Discharge Instructions (Signed)
 You have been evaluated for your fall.  You have suffered a fracture involving your left orbital floor which is your eye socket.  Please call and follow-up closely with your surgeon or with Dr. Helga to be seen in 2 weeks as your injury may require surgical intervention.  When sleeping please keep your head elevated at at least 30 degrees angle.  You can achieve this by using several pillows to prop up your head or sleep in a recliner.  This will help with the swelling.  Avoid blowing your nose or sneezing as it may cause increased pressure to your orbit and complicate your condition.  Take opiate pain medication as needed but be aware it can cause drowsiness.  You may wear a knee sleeve to your right knee from your knee sprain.  Fortunately x-ray of your right knee did not show any broken bone.
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
## 2023-08-21 NOTE — Telephone Encounter (Signed)
 Pt spouse called to have referral sent to Dr Carrol as he has done surgery for pt before and they have spoken with the office for approval.  Leory Allinson J. Debarah, BSN, RN, NCM  Transitions of Care  Nurse Case Manager  [facility] Emergency Departments  Operative Services  [phone]

## 2023-08-21 NOTE — Discharge Instructions (Addendum)
 Accepted to [facility] , Dr. Layne accepting provider

## 2023-08-22 ENCOUNTER — Telehealth (HOSPITAL_COMMUNITY): Payer: MEDICAID | Admitting: Registered Nurse

## 2023-08-22 DIAGNOSIS — F1020 Alcohol dependence, uncomplicated: Secondary | ICD-10-CM

## 2023-08-22 MED ORDER — OLANZAPINE 5 MG PO TABS
5.0000 mg | ORAL_TABLET | Freq: Two times a day (BID) | ORAL | Status: DC
  Administered 2023-08-22 – 2023-08-23 (×2): 5 mg via ORAL
  Filled 2023-08-22 (×2): qty 1

## 2023-08-22 MED ORDER — OXYCODONE HCL 5 MG PO TABS
5.0000 mg | ORAL_TABLET | Freq: Four times a day (QID) | ORAL | Status: DC | PRN
  Administered 2023-08-22 – 2023-08-31 (×18): 5 mg via ORAL
  Filled 2023-08-22 (×18): qty 1

## 2023-08-22 MED ORDER — GABAPENTIN 300 MG PO CAPS
600.0000 mg | ORAL_CAPSULE | Freq: Two times a day (BID) | ORAL | Status: DC
  Administered 2023-08-22 – 2023-08-26 (×8): 600 mg via ORAL
  Filled 2023-08-22 (×8): qty 2

## 2023-08-22 MED ORDER — SERTRALINE HCL 100 MG PO TABS
100.0000 mg | ORAL_TABLET | Freq: Every day | ORAL | Status: DC
  Administered 2023-08-22 – 2023-08-23 (×2): 100 mg via ORAL
  Filled 2023-08-22 (×2): qty 1
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
## 2023-08-22 NOTE — Plan of Care (Signed)

## 2023-08-22 NOTE — Group Note (Signed)
 Date:  08/22/2023 Time:  5:09 PM  Group Topic/Focus:  Wellness Toolbox:   The focus of this group is to discuss various aspects of wellness, balancing those aspects and exploring ways to increase the ability to experience wellness.  Patients will create a wellness toolbox for use upon discharge.    Participation Level:  Did Not Attend   Deitra Clap [facility] 08/22/2023, 5:09 PM

## 2023-08-22 NOTE — Progress Notes (Signed)
   08/22/23 1200  CIWA-Ar  BP 125/76  Pulse Rate 89  Nausea and Vomiting 0  Tactile Disturbances 0  Tremor 0  Auditory Disturbances 0  Paroxysmal Sweats 0  Visual Disturbances 0  Anxiety 0  Headache, Fullness in Head 0  Agitation 0  Orientation and Clouding of Sensorium 0  CIWA-Ar Total 0

## 2023-08-22 NOTE — Group Note (Signed)
 [facility] LCSW Group Therapy Note   Group Date: 08/22/2023 Start Time: 1300 End Time: 1350   Type of Therapy/Topic:  Group Therapy:  Balance in Life  Participation Level:  Did Not Attend   Description of Group:    This group will address the concept of balance and how it feels and looks when one is unbalanced. Patients will be encouraged to process areas in their lives that are out of balance, and identify reasons for remaining unbalanced. Facilitators will guide patients utilizing problem- solving interventions to address and correct the stressor making their life unbalanced. Understanding and applying boundaries will be explored and addressed for obtaining  and maintaining a balanced life. Patients will be encouraged to explore ways to assertively make their unbalanced needs known to significant others in their lives, using other group members and facilitator for support and feedback.  Therapeutic Goals: Patient will identify two or more emotions or situations they have that consume much of in their lives. Patient will identify signs/triggers that life has become out of balance:  Patient will identify two ways to set boundaries in order to achieve balance in their lives:  Patient will demonstrate ability to communicate their needs through discussion and/or role plays  Summary of Patient Progress: Patient did not attend group.    Therapeutic Modalities:   Cognitive Behavioral Therapy Solution-Focused Therapy Assertiveness Training   Nadara JONELLE Fam, LCSW
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
## 2023-08-22 NOTE — Group Note (Signed)
 Date:  08/22/2023 Time:  5:36 PM  Group Topic/Focus:  Goals Group:   The focus of this group is to help patients establish daily goals to achieve during treatment and discuss how the patient can incorporate goal setting into their daily lives to aide in recovery.    Participation Level:  Did Not Attend    Deitra Clap [facility] 08/22/2023, 5:36 PM

## 2023-08-22 NOTE — Group Note (Signed)
 Date:  08/22/2023 Time:  9:21 PM  Group Topic/Focus:  Coping With Mental Health Crisis:   The purpose of this group is to help patients identify strategies for coping with mental health crisis.  Group discusses possible causes of crisis and ways to manage them effectively. Identifying Needs:   The focus of this group is to help patients identify their personal needs that have been historically problematic and identify healthy behaviors to address their needs.    Participation Level:  Active  Participation Quality:  Appropriate  Affect:  Appropriate  Cognitive:  Appropriate  Insight: Appropriate  Engagement in Group:  Engaged and Supportive  Modes of Intervention:  Discussion and Education  Additional Comments:  n/a  Jaisean Monteforte L 08/22/2023, 9:21 PM

## 2023-08-22 NOTE — Progress Notes (Signed)
   08/22/23 1130  Psych Admission Type (Psych Patients Only)  Admission Status Voluntary  Psychosocial Assessment  Patient Complaints Depression;Sleep disturbance;Other (Comment) (patient states that the noise from the doors kept him up last night; he experinced AVH hearing things and someone looking in my window last night; patient also stated I just feel down and I want to sleep.)  Eye Contact Fair;Watchful  Facial Expression Pained  Affect Depressed  Speech Logical/coherent  Interaction Assertive  Motor Activity Slow  Appearance/Hygiene In scrubs  Behavior Characteristics Cooperative;Appropriate to situation  Mood Depressed;Pleasant  Aggressive Behavior  Effect No apparent injury  Thought Process  Coherency WDL  Content WDL  Delusions None reported or observed  Perception Hallucinations  Hallucination Auditory;Visual (perpatient, last night, but not today)  Judgment WDL  Confusion None  Danger to Self  Current suicidal ideation? Denies (patient states not right now.)  Self-Injurious Behavior No self-injurious ideation or behavior indicators observed or expressed   Agreement Not to Harm Self Yes  Description of Agreement Verbal  Danger to Others  Danger to Others None reported or observed

## 2023-08-22 NOTE — Progress Notes (Signed)
   08/22/23 1758  CIWA-Ar  BP 136/79  Pulse Rate 83  Nausea and Vomiting 0  Tactile Disturbances 0  Tremor 0  Auditory Disturbances 0  Paroxysmal Sweats 0  Visual Disturbances 0  Anxiety 0  Headache, Fullness in Head 0  Agitation 0  Orientation and Clouding of Sensorium 0  CIWA-Ar Total 0
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
## 2023-08-22 NOTE — Group Note (Signed)
 Date:  08/22/2023 Time:  12:27 AM  Group Topic/Focus:  Overcoming Stress:   The focus of this group is to define stress and help patients assess their triggers.    Participation Level:  Did Not Attend  Participation Quality:  none  Affect:  none  Cognitive:  none  Insight: None  Engagement in Group:  none  Modes of Intervention:  none  Additional Comments:  none   Juliona Vales 08/22/2023, 12:27 AM

## 2023-08-23 ENCOUNTER — Other Ambulatory Visit: Payer: Self-pay | Admitting: Family Medicine

## 2023-08-23 MED ORDER — SERTRALINE HCL 25 MG PO TABS
150.0000 mg | ORAL_TABLET | Freq: Every day | ORAL | Status: DC
  Administered 2023-08-24 – 2023-08-31 (×8): 150 mg via ORAL
  Filled 2023-08-23 (×8): qty 2

## 2023-08-23 MED ORDER — OLANZAPINE 5 MG PO TABS
5.0000 mg | ORAL_TABLET | Freq: Every day | ORAL | Status: DC
  Administered 2023-08-23 – 2023-08-28 (×6): 5 mg via ORAL
  Filled 2023-08-23 (×6): qty 1

## 2023-08-23 MED ORDER — OLANZAPINE 10 MG PO TABS
10.0000 mg | ORAL_TABLET | Freq: Every day | ORAL | Status: DC
  Administered 2023-08-24 – 2023-08-30 (×7): 10 mg via ORAL
  Filled 2023-08-23 (×7): qty 1

## 2023-08-23 NOTE — BH IP Treatment Plan (Signed)
 Interdisciplinary Treatment and Diagnostic Plan Update  08/23/2023 Time of Session: 10:09 AM Evan Wilson MRN: [id number]  Principal Diagnosis: MDD (major depressive disorder), recurrent, severe, with psychosis (HCC)  Secondary Diagnoses: Principal Problem:   MDD (major depressive disorder), recurrent, severe, with psychosis (HCC) Active Problems:   Alcohol use disorder, moderate, dependence (HCC)   Alcohol abuse   Current Medications:  Current Facility-Administered Medications  Medication Dose Route Frequency Provider Last Rate Last Admin   haloperidol  (HALDOL ) tablet 5 mg  5 mg Oral TID PRN Onuoha, Chinwendu V, NP       And   diphenhydrAMINE  (BENADRYL ) capsule 50 mg  50 mg Oral TID PRN Onuoha, Chinwendu V, NP       haloperidol  lactate (HALDOL ) injection 5 mg  5 mg Intramuscular TID PRN Onuoha, Chinwendu V, NP       And   diphenhydrAMINE  (BENADRYL ) injection 50 mg  50 mg Intramuscular TID PRN Onuoha, Chinwendu V, NP       And   LORazepam  (ATIVAN ) injection 2 mg  2 mg Intramuscular TID PRN Onuoha, Chinwendu V, NP       haloperidol  lactate (HALDOL ) injection 10 mg  10 mg Intramuscular TID PRN Onuoha, Chinwendu V, NP       And   diphenhydrAMINE  (BENADRYL ) injection 50 mg  50 mg Intramuscular TID PRN Onuoha, Chinwendu V, NP       And   LORazepam  (ATIVAN ) injection 2 mg  2 mg Intramuscular TID PRN Onuoha, Chinwendu V, NP       gabapentin  (NEURONTIN ) capsule 600 mg  600 mg Oral BID Cleotilde Hoy HERO, NP   600 mg at 08/23/23 9171   hydrOXYzine  (ATARAX ) tablet 25 mg  25 mg Oral Q6H PRN Onuoha, Chinwendu V, NP       loperamide  (IMODIUM ) capsule 2-4 mg  2-4 mg Oral PRN Onuoha, Chinwendu V, NP       LORazepam  (ATIVAN ) tablet 1 mg  1 mg Oral Q6H PRN Onuoha, Chinwendu V, NP       multivitamin with minerals tablet 1 tablet  1 tablet Oral Daily Onuoha, Chinwendu V, NP   1 tablet at 08/23/23 0828   OLANZapine  (ZYPREXA ) tablet 5 mg  5 mg Oral BID Cleotilde Hoy HERO, NP   5 mg at 08/23/23 9171    ondansetron  (ZOFRAN -ODT) disintegrating tablet 4 mg  4 mg Oral Q6H PRN Onuoha, Chinwendu V, NP       oxyCODONE  (Oxy IR/ROXICODONE ) immediate release tablet 5 mg  5 mg Oral Q6H PRN Cleotilde Hoy HERO, NP   5 mg at 08/22/23 1710   sertraline  (ZOLOFT ) tablet 100 mg  100 mg Oral Daily Cleotilde Hoy HERO, NP   100 mg at 08/23/23 9171   thiamine  (VITAMIN B1) tablet 100 mg  100 mg Oral Daily Onuoha, Chinwendu V, NP   100 mg at 08/23/23 9171   PTA Medications: Medications Prior to Admission  Medication Sig Dispense Refill Last Dose/Taking   gabapentin  (NEURONTIN ) 300 MG capsule Take 2 capsules (600 mg total) by mouth 2 (two) times daily. 120 capsule 2 08/21/2023   Multiple Vitamin (MULTIVITAMIN) tablet Take 1 tablet by mouth daily.   08/21/2023   oxyCODONE  (ROXICODONE ) 5 MG immediate release tablet Take 1 tablet (5 mg total) by mouth every 6 (six) hours as needed for severe pain (pain score 7-10) or moderate pain (pain score 4-6). 10 tablet 0 08/21/2023   sertraline  (ZOLOFT ) 100 MG tablet Take 1 tablet (100 mg total) by mouth daily.  30 tablet 1 08/21/2023   thiamine  (VITAMIN B-1) 100 MG tablet Take 1 tablet (100 mg total) by mouth daily. 30 tablet 0 Unknown    Patient Stressors:    Patient Strengths:    Treatment Modalities: Medication Management, Group therapy, Case management,  1 to 1 session with clinician, Psychoeducation, Recreational therapy.   Physician Treatment Plan for Primary Diagnosis: MDD (major depressive disorder), recurrent, severe, with psychosis (HCC) Long Term Goal(s): Improvement in symptoms so as ready for discharge   Short Term Goals: Ability to identify changes in lifestyle to reduce recurrence of condition will improve  Medication Management: Evaluate patient's response, side effects, and tolerance of medication regimen.  Therapeutic Interventions: 1 to 1 sessions, Unit Group sessions and Medication administration.  Evaluation of Outcomes: Not Met  Physician Treatment  Plan for Secondary Diagnosis: Principal Problem:   MDD (major depressive disorder), recurrent, severe, with psychosis (HCC) Active Problems:   Alcohol use disorder, moderate, dependence (HCC)   Alcohol abuse  Long Term Goal(s): Improvement in symptoms so as ready for discharge   Short Term Goals: Ability to identify changes in lifestyle to reduce recurrence of condition will improve     Medication Management: Evaluate patient's response, side effects, and tolerance of medication regimen.  Therapeutic Interventions: 1 to 1 sessions, Unit Group sessions and Medication administration.  Evaluation of Outcomes: Not Met   RN Treatment Plan for Primary Diagnosis: MDD (major depressive disorder), recurrent, severe, with psychosis (HCC) Long Term Goal(s): Knowledge of disease and therapeutic regimen to maintain health will improve  Short Term Goals: Ability to remain free from injury will improve, Ability to verbalize frustration and anger appropriately will improve, Ability to demonstrate self-control, Ability to participate in decision making will improve, Ability to verbalize feelings will improve, Ability to disclose and discuss suicidal ideas, and Ability to identify and develop effective coping behaviors will improve  Medication Management: RN will administer medications as ordered by provider, will assess and evaluate patient's response and provide education to patient for prescribed medication. RN will report any adverse and/or side effects to prescribing provider.  Therapeutic Interventions: 1 on 1 counseling sessions, Psychoeducation, Medication administration, Evaluate responses to treatment, Monitor vital signs and CBGs as ordered, Perform/monitor CIWA, COWS, AIMS and Fall Risk screenings as ordered, Perform wound care treatments as ordered.  Evaluation of Outcomes: Not Met   LCSW Treatment Plan for Primary Diagnosis: MDD (major depressive disorder), recurrent, severe, with psychosis  (HCC) Long Term Goal(s): Safe transition to appropriate next level of care at discharge, Engage patient in therapeutic group addressing interpersonal concerns.  Short Term Goals: Engage patient in aftercare planning with referrals and resources, Increase social support, Increase ability to appropriately verbalize feelings, Increase emotional regulation, Facilitate acceptance of mental health diagnosis and concerns, Facilitate patient progression through stages of change regarding substance use diagnoses and concerns, Identify triggers associated with mental health/substance abuse issues, and Increase skills for wellness and recovery  Therapeutic Interventions: Assess for all discharge needs, 1 to 1 time with Social worker, Explore available resources and support systems, Assess for adequacy in community support network, Educate family and significant other(s) on suicide prevention, Complete Psychosocial Assessment, Interpersonal group therapy.  Evaluation of Outcomes: Not Met   Progress in Treatment: Attending groups: Yes. and No. Participating in groups: Yes. and No. Taking medication as prescribed: Yes. Toleration medication: Yes. Family/Significant other contact made: No, will contact:  CSW to contact once permission is granted.  Patient understands diagnosis: Yes. Discussing patient identified problems/goals with staff:  Yes. Medical problems stabilized or resolved: Yes. Denies suicidal/homicidal ideation: Yes. Issues/concerns per patient self-inventory: No. Other: None  New problem(s) identified: No, Describe:  None  New Short Term/Long Term Goal(s):detox, elimination of symptoms of psychosis, medication management for mood stabilization; elimination of SI thoughts; development of comprehensive mental wellness/sobriety plan.    Patient Goals:  To have a level mind.  Discharge Plan or Barriers: CSW to assist with the development of appropriate discharge plan.    Reason for  Continuation of Hospitalization: Anxiety Depression Hallucinations Suicidal ideation  Estimated Length of Stay: 1-7 days.   Last 3 [place] Suicide Severity Risk Score: Flowsheet Row Admission (Current) from 08/21/2023 in [facility]INE Most recent reading at 08/21/2023 10:53 PM ED from 08/21/2023 in [facility] Most recent reading at 08/21/2023  5:15 PM ED from 08/21/2023 in [facility] at [facility] Most recent reading at 08/21/2023  8:52 AM  C-SSRS RISK CATEGORY Error: Q7 should not be populated when Q6 is No High Risk No Risk    Last PHQ 2/9 Scores:    05/13/2023   10:05 AM 03/06/2023   11:32 AM 03/02/2023    2:34 PM  Depression screen PHQ 2/9  Decreased Interest 2 1 2   Down, Depressed, Hopeless 0 1 3  PHQ - 2 Score 2 2 5   Altered sleeping 0 1 3  Tired, decreased energy 2 0 1  Change in appetite 3 1 1   Feeling bad or failure about yourself  1 1 3   Trouble concentrating 3 1 2   Moving slowly or fidgety/restless 2 2   Suicidal thoughts 0 1 1  PHQ-9 Score 13 9 16   Difficult doing work/chores Not difficult at all Somewhat difficult     Scribe for Treatment Team: Alveta CHRISTELLA Kerns, LCSW 08/23/2023 11:07 AM

## 2023-08-23 NOTE — Group Note (Signed)
 Date:  08/23/2023 Time:  11:32 AM  Group Topic/Focus:  Healthy Communication:   The focus of this group is to discuss communication, barriers to communication, as well as healthy ways to communicate with others.    Participation Level:  Did Not Attend   Deitra Clap [facility] 08/23/2023, 11:32 AM

## 2023-08-23 NOTE — [facility] Suicide Risk Assessment (Signed)
 [facility] INPATIENT:  Family/Significant Other Suicide Prevention Education  Suicide Prevention Education:  Education Completed;  Harlene Barge, wife, [phone],  (name of family member/significant other) has been identified by the patient as the family member/significant other with whom the patient will be residing, and identified as the person(s) who will aid the patient in the event of a mental health crisis (suicidal ideations/suicide attempt).  With written consent from the patient, the family member/significant other has been provided the following suicide prevention education, prior to the and/or following the discharge of the patient.  The suicide prevention education provided includes the following: Suicide risk factors Suicide prevention and interventions National Suicide Hotline telephone number [facility] assessment telephone number [facility] Emergency Assistance 911 [facility] and/or Residential Mobile Crisis Unit telephone number  Request made of family/significant other to: Remove weapons (e.g., guns, rifles, knives), all items previously/currently identified as safety concern.   Remove drugs/medications (over-the-counter, prescriptions, illicit drugs), all items previously/currently identified as a safety concern.  The family member/significant other verbalizes understanding of the suicide prevention education information provided.  The family member/significant other agrees to remove the items of safety concern listed above.  Evan Wilson 08/23/2023, 3:21 PM

## 2023-08-23 NOTE — Group Note (Signed)
 Date:  08/23/2023 Time:  4:05 PM  Group Topic/Focus:  Coping With Mental Health Crisis:   The purpose of this group is to help patients identify strategies for coping with mental health crisis.  Group discusses possible causes of crisis and ways to manage them effectively.    Participation Level:  Did Not Attend   [name] Evan Wilson 08/23/2023, 4:05 PM

## 2023-08-23 NOTE — [facility] Counselor (Signed)
 CSW spoke with  Harlene Barge, wife, [phone].  Wife reports that his excessive drinking.  She reports that the patient drinks 3 of the bigger beers mixed with his medications, not every night maybe 3 or 4 times a week  She reports that he is quick to flip his switch. She reports he is quick to anger and it's about little things.  He'll do things like yelling and screaming.  She reports something happens when he sleeps.  She reports he falls out of bed, he jumps in his sleep, he does this thing with his hand and says he's ' looking for the box.  She reports that she is unsure if patient is a danger to self or others.  She reports that I get concerned when he gets so quiet, because of his past attempt.  She denies any access to weapons in the home.   Sherryle Margo, MSW, LCSW 08/23/2023 3:32 PM

## 2023-08-23 NOTE — Progress Notes (Signed)
   08/23/23 1000  Psych Admission Type (Psych Patients Only)  Admission Status Voluntary  Psychosocial Assessment  Patient Complaints Sleep disturbance  Eye Contact Fair  Facial Expression Pensive  Affect Depressed  Speech Logical/coherent  Interaction Assertive  Motor Activity Slow  Appearance/Hygiene In scrubs  Behavior Characteristics Cooperative  Mood Depressed;Pleasant  Thought Process  Coherency WDL  Content WDL  Delusions None reported or observed  Perception Hallucinations  Hallucination Auditory (last night while trying to sleep)  Judgment Impaired  Confusion None  Danger to Self  Current suicidal ideation? Denies  Agreement Not to Harm Self Yes  Description of Agreement verbal

## 2023-08-23 NOTE — Plan of Care (Signed)
  Problem: Education: Goal: Emotional status will improve 08/23/2023 0450 by Merriam Donnice DASEN, RN Outcome: Progressing 08/23/2023 0450 by Merriam Donnice DASEN, RN Outcome: Progressing Goal: Mental status will improve 08/23/2023 0450 by Merriam Donnice DASEN, RN Outcome: Progressing 08/23/2023 0450 by Merriam Donnice DASEN, RN Outcome: Progressing
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
## 2023-08-23 NOTE — Plan of Care (Signed)
   Problem: Education: Goal: Knowledge of [place] [facility] Education information/materials will improve Outcome: Progressing

## 2023-08-24 MED ORDER — MIRTAZAPINE 15 MG PO TABS
7.5000 mg | ORAL_TABLET | Freq: Every evening | ORAL | Status: DC | PRN
  Administered 2023-08-24 – 2023-08-30 (×8): 7.5 mg via ORAL
  Filled 2023-08-24 (×7): qty 1
  Filled 2023-08-24: qty 0.5

## 2023-08-24 NOTE — Group Note (Signed)
 Date:  08/24/2023 Time:  2:56 PM  Group Topic/Focus:  Self Care:   The focus of this group is to help patients understand the importance of self-care in order to improve or restore emotional, physical, spiritual, interpersonal, and financial health. Wellness Toolbox:   The focus of this group is to discuss various aspects of wellness, balancing those aspects and exploring ways to increase the ability to experience wellness.  Patients will create a wellness toolbox for use upon discharge.    Participation Level:  Did Not Attend  Participation Quality:    Affect:    Cognitive:    Insight:   Engagement in Group:    Modes of Intervention:    Additional Comments:    Hubert Raatz L Lemon Whitacre 08/24/2023, 2:56 PM
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
## 2023-08-24 NOTE — Group Note (Signed)
 Date:  08/24/2023 Time:  8:44 PM  Group Topic/Focus:  Wrap-Up Group:   The focus of this group is to help patients review their daily goal of treatment and discuss progress on daily workbooks.    Participation Level:  Minimal  Participation Quality:  Appropriate and Attentive  Affect:  Appropriate  Cognitive:  Alert  Insight: Appropriate  Engagement in Group:  Engaged  Modes of Intervention:  Discussion  Additional Comments:     Maglione,Evan Wilson 08/24/2023, 8:44 PM

## 2023-08-24 NOTE — Plan of Care (Signed)

## 2023-08-24 NOTE — Progress Notes (Signed)
 Patient was up and visible on the unit at the start of the shift, briefly. Withdrawn to self and room for majority of the shift. Received PRN for c/o anxiety (see EMAR). Compliant with all aspects of care this shift, including scheduled medications. Ate evening snack. Slept throughout majority of the night. No distress noted/ reported. Rise and fall of chest noted. All orders maintained as written.

## 2023-08-24 NOTE — Plan of Care (Signed)
   Problem: Education: Goal: Knowledge of [facility] Education information/materials will improve Outcome: Progressing Goal: Emotional status will improve Outcome: Progressing Goal: Mental status will improve Outcome: Progressing Goal: Verbalization of understanding the information provided will improve Outcome: Progressing

## 2023-08-24 NOTE — Group Note (Signed)
 Date:  08/24/2023 Time:  11:10 AM  Group Topic/Focus:  Coping With Mental Health Crisis:   The purpose of this group is to help patients identify strategies for coping with mental health crisis.  Group discusses possible causes of crisis and ways to manage them effectively. Goals Group:   The focus of this group is to help patients establish daily goals to achieve during treatment and discuss how the patient can incorporate goal setting into their daily lives to aide in recovery. Overcoming Stress:   The focus of this group is to define stress and help patients assess their triggers.    Participation Level:  Active  Participation Quality:  Appropriate and Attentive  Affect:  Appropriate  Cognitive:  Alert and Appropriate  Insight: Appropriate and Good  Engagement in Group:  Engaged  Modes of Intervention:  Activity and Discussion  Additional Comments:    Billy Turvey L Savaya Hakes 08/24/2023, 11:10 AM
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
## 2023-08-25 MED ORDER — PNEUMOCOCCAL 20-VAL CONJ VACC 0.5 ML IM SUSY
0.5000 mL | PREFILLED_SYRINGE | INTRAMUSCULAR | Status: AC
  Administered 2023-08-26: 0.5 mL via INTRAMUSCULAR
  Filled 2023-08-25 (×2): qty 0.5

## 2023-08-25 NOTE — Group Note (Signed)
 LCSW Group Therapy Note   Group Date: 08/25/2023 Start Time: 1300 End Time: 1400   Type of Therapy and Topic:  Group Therapy: Boundaries  Participation Level:  Did Not Attend  Description of Group: This group will address the use of boundaries in their personal lives. Patients will explore why boundaries are important, the difference between healthy and unhealthy boundaries, and negative and postive outcomes of different boundaries and will look at how boundaries can be crossed.  Patients will be encouraged to identify current boundaries in their own lives and identify what kind of boundary is being set. Facilitators will guide patients in utilizing problem-solving interventions to address and correct types boundaries being used and to address when no boundary is being used. Understanding and applying boundaries will be explored and addressed for obtaining and maintaining a balanced life. Patients will be encouraged to explore ways to assertively make their boundaries and needs known to significant others in their lives, using other group members and facilitator for role play, support, and feedback.  Therapeutic Goals:  1.  Patient will identify areas in their life where setting clear boundaries could be  used to improve their life.  2.  Patient will identify signs/triggers that a boundary is not being respected. 3.  Patient will identify two ways to set boundaries in order to achieve balance in  their lives: 4.  Patient will demonstrate ability to communicate their needs and set boundaries  through discussion and/or role plays  Summary of Patient Progress:  Patient did not attend.   Therapeutic Modalities:   Cognitive Behavioral Therapy Solution-Focused Therapy  Alveta CHRISTELLA Ezzard ISRAEL 08/25/2023  2:19 PM

## 2023-08-25 NOTE — Progress Notes (Signed)
   08/24/23 2000  Psych Admission Type (Psych Patients Only)  Admission Status Voluntary  Psychosocial Assessment  Patient Complaints Sadness;Depression  Eye Contact Fair  Facial Expression Flat  Affect Sad;Depressed  Speech Logical/coherent  Interaction Guarded  Motor Activity Restless  Appearance/Hygiene In scrubs  Behavior Characteristics Appropriate to situation;Cooperative  Mood Irritable;Depressed  Thought Process  Coherency WDL  Content WDL  Delusions WDL  Perception WDL  Hallucination None reported or observed  Judgment WDL  Confusion WDL  Danger to Self  Current suicidal ideation? Denies  Self-Injurious Behavior No self-injurious ideation or behavior indicators observed or expressed   Agreement Not to Harm Self Yes  Description of Agreement verbal  Danger to Others  Danger to Others None reported or observed   Patient alert and oriented x 4, affect is flat but brightens upon approach, he denies SI/HI/AVH, he endorses pain to the left side of his face and was medicated as ordered. 15 minutes safety checks maintained.

## 2023-08-25 NOTE — Plan of Care (Signed)
   Problem: Education: Goal: Emotional status will improve Outcome: Progressing   Problem: Education: Goal: Mental status will improve Outcome: Progressing
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
## 2023-08-25 NOTE — Group Note (Signed)
 Date:  08/25/2023 Time:  9:05 PM  Group Topic/Focus:  Self Care:   The focus of this group is to help patients understand the importance of self-care in order to improve or restore emotional, physical, spiritual, interpersonal, and financial health. Stages of Change:   The focus of this group is to explain the stages of change and help patients identify changes they want to make upon discharge. Wrap-Up Group:   The focus of this group is to help patients review their daily goal of treatment and discuss progress on daily workbooks.    Participation Level:  Active  Participation Quality:  Appropriate and Attentive  Affect:  Appropriate  Cognitive:  Alert  Insight: Appropriate and Good  Engagement in Group:  Engaged  Modes of Intervention:  Discussion and Socialization  Additional Comments:     Maglione,Idamae Coccia E 08/25/2023, 9:05 PM

## 2023-08-25 NOTE — Progress Notes (Addendum)
  Pt pleasant cooperative, continue to endorse A/H at times, no SI/HI plan or intent and he is compliant with taking his medication  08/25/23 0900  Psych Admission Type (Psych Patients Only)  Admission Status Voluntary  Psychosocial Assessment  Patient Complaints Depression  Eye Contact Brief  Facial Expression Sad  Affect Sad  Speech Logical/coherent  Interaction Guarded  Motor Activity Restless  Appearance/Hygiene In scrubs  Behavior Characteristics Cooperative;Appropriate to situation  Mood Depressed  Thought Process  Coherency WDL  Content WDL  Delusions None reported or observed  Perception WDL  Hallucination Auditory  Judgment WDL  Confusion None  Danger to Self  Current suicidal ideation? Denies  Agreement Not to Harm Self Yes  Description of Agreement verbal  Danger to Others  Danger to Others None reported or observed

## 2023-08-25 NOTE — Group Note (Signed)
 Date:  08/25/2023 Time:  11:20 AM  Group Topic/Focus:  Wellness Toolbox:   The focus of this group is to discuss various aspects of wellness, balancing those aspects and exploring ways to increase the ability to experience wellness.  Patients will create a wellness toolbox for use upon discharge.    Participation Level:  Did Not Attend  Deitra Clap [facility] 08/25/2023, 11:20 AM

## 2023-08-25 NOTE — Plan of Care (Incomplete)
   Problem: Education: Goal: Emotional status will improve Outcome: Progressing Goal: Mental status will improve Outcome: Progressing   Problem: Activity: Goal: Sleeping patterns will improve Outcome: Progressing   Problem: Safety: Goal: Periods of time without injury will increase Outcome: Progressing

## 2023-08-26 DIAGNOSIS — F333 Major depressive disorder, recurrent, severe with psychotic symptoms: Secondary | ICD-10-CM

## 2023-08-26 MED ORDER — GABAPENTIN 400 MG PO CAPS
400.0000 mg | ORAL_CAPSULE | Freq: Three times a day (TID) | ORAL | Status: DC
  Administered 2023-08-26 – 2023-08-31 (×16): 400 mg via ORAL
  Filled 2023-08-26 (×16): qty 1

## 2023-08-26 NOTE — Progress Notes (Signed)
   08/25/23 2000  Psych Admission Type (Psych Patients Only)  Admission Status Voluntary  Psychosocial Assessment  Patient Complaints Depression  Eye Contact Fair  Facial Expression Flat  Affect Sad;Depressed  Speech Logical/coherent  Interaction Guarded  Motor Activity Restless  Appearance/Hygiene In scrubs  Behavior Characteristics Cooperative;Appropriate to situation;Calm  Mood Depressed;Sad  Thought Process  Coherency WDL  Content WDL  Delusions WDL  Perception WDL  Hallucination None reported or observed  Judgment WDL  Confusion WDL  Danger to Self  Current suicidal ideation? Denies  Self-Injurious Behavior No self-injurious ideation or behavior indicators observed or expressed   Agreement Not to Harm Self Yes  Description of Agreement verbal  Danger to Others  Danger to Others None reported or observed

## 2023-08-26 NOTE — Plan of Care (Signed)
  Problem: Education: Goal: Knowledge of [facility] Education information/materials will improve Outcome: Progressing   Problem: Education: Goal: Mental status will improve Outcome: Progressing   Problem: Education: Goal: Verbalization of understanding the information provided will improve Outcome: Progressing   

## 2023-08-26 NOTE — Plan of Care (Signed)

## 2023-08-26 NOTE — Group Note (Signed)
 Date:  08/26/2023 Time:  11:31 AM  Group Topic/Focus:  Developing a Wellness Toolbox:   The focus of this group is to help patients develop a wellness toolbox with skills and strategies to promote recovery upon discharge.    Participation Level:  Active  Participation Quality:  Appropriate  Affect:  Appropriate  Cognitive:  Appropriate  Insight: Appropriate  Engagement in Group:  Engaged  Modes of Intervention:  Activity  Additional Comments:    Chinara Hertzberg 08/26/2023, 11:31 AM

## 2023-08-26 NOTE — Progress Notes (Signed)
   08/26/23 0855  Psych Admission Type (Psych Patients Only)  Admission Status Voluntary  Psychosocial Assessment  Patient Complaints Anxiety;Depression  Eye Contact Brief  Facial Expression Flat  Affect Depressed  Speech Logical/coherent  Interaction Guarded  Motor Activity Slow  Appearance/Hygiene In scrubs  Behavior Characteristics Cooperative  Mood Depressed;Anxious  Thought Process  Coherency WDL  Content WDL  Delusions None reported or observed  Perception WDL  Hallucination None reported or observed  Judgment WDL  Confusion None  Danger to Self  Current suicidal ideation? Denies  Self-Injurious Behavior No self-injurious ideation or behavior indicators observed or expressed   Agreement Not to Harm Self Yes  Description of Agreement verbal  Danger to Others  Danger to Others None reported or observed

## 2023-08-26 NOTE — Group Note (Signed)
 [facility] LCSW Group Therapy Note    Group Date: 08/26/2023 Start Time: 1300 End Time: 1400  Type of Therapy and Topic:  Group Therapy:  Overcoming Obstacles  Participation Level:  [facility] PARTICIPATION LEVEL: Active  Mood:  Description of Group:   In this group patients will be encouraged to explore what they see as obstacles to their own wellness and recovery. They will be guided to discuss their thoughts, feelings, and behaviors related to these obstacles. The group will process together ways to cope with barriers, with attention given to specific choices patients can make. Each patient will be challenged to identify changes they are motivated to make in order to overcome their obstacles. This group will be process-oriented, with patients participating in exploration of their own experiences as well as giving and receiving support and challenge from other group members.  Therapeutic Goals: 1. Patient will identify personal and current obstacles as they relate to admission. 2. Patient will identify barriers that currently interfere with their wellness or overcoming obstacles.  3. Patient will identify feelings, thought process and behaviors related to these barriers. 4. Patient will identify two changes they are willing to make to overcome these obstacles:    Summary of Patient Progress Patent was present in group.  Patient was an active participant.  He was engaged and supportive of others.  Patient displayed fair insight.  Patient was able to discuss how the unknown has led to anxiety and an obstacle for him.   Therapeutic Modalities:   Cognitive Behavioral Therapy Solution Focused Therapy Motivational Interviewing Relapse Prevention Therapy   Sherryle JINNY Margo, LCSW
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
## 2023-08-26 NOTE — Group Note (Signed)
 Recreation Therapy Group Note   Group Topic:Relaxation  Group Date: 08/26/2023 Start Time: 1530 End Time: 1610 Facilitators: Celestia Jeoffrey BRAVO, LRT, CTRS Location: Craft Room  Group Description: PMR (Progressive Muscle Relaxation). LRT educates patients on what PMR is and the benefits that come from it. Patients are asked to sit with their feet flat on the floor while sitting up and all the way back in their chair, if possible. LRT and pts follow a prompt through a speaker that requires you to tense and release different muscles in their body and focus on their breathing. During session, lights are off and soft music is being played.   Goal Area(s) Addressed:  Patients will be able to describe progressive muscle relaxation.  Patient will practice using relaxation technique. Patient will identify a new coping skill.  Patient will follow multistep directions to reduce anxiety and stress.   Affect/Mood: Appropriate   Participation Level: Active   Participation Quality: Independent   Behavior: Appropriate   Speech/Thought Process: Coherent   Insight: Good   Judgement: Good   Modes of Intervention: Activity   Patient Response to Interventions:  Receptive   Education Outcome:  Acknowledges education   Clinical Observations/Individualized Feedback: Syler was active in their participation of session activities and group discussion. Pt interacted well with LRT and peers duration of session.    Plan: Continue to engage patient in RT group sessions 2-3x/week.   Jeoffrey BRAVO Celestia, LRT, CTRS 08/26/2023 5:19 PM

## 2023-08-26 NOTE — Group Note (Signed)
 Recreation Therapy Group Note   Group Topic:Coping Skills  Group Date: 08/26/2023 Start Time: 1045 End Time: 1130 Facilitators: Celestia Jeoffrey BRAVO, LRT, CTRS Location: Craft Room   Group Description: Mind Map.  Patient was provided a blank template of a diagram with 32 blank boxes in a tiered system, branching from the center (similar to a bubble chart). LRT directed patients to label the middle of the diagram Coping Skills. LRT and patients then came up with 8 different coping skills as examples. Pt were directed to record their coping skills in the 2nd tier boxes closest to the center.  Patients would then share their coping skills with the group as LRT wrote them out. LRT gave a handout of 99 different coping skills at the end of group.   Goal Area(s) Addressed: Patients will be able to define "coping skills". Patient will identify new coping skills.  Patient will increase communication.  Affect/Mood: Appropriate   Participation Level: Active and Engaged   Participation Quality: Independent   Behavior: Appropriate, Calm, and Cooperative   Speech/Thought Process: Coherent   Insight: Good   Judgement: Good   Modes of Intervention: Clarification, Education, and Worksheet   Patient Response to Interventions:  Attentive, Engaged, Interested , and Receptive   Education Outcome:  Acknowledges education   Clinical Observations/Individualized Feedback: Layth was active in their participation of session activities and group discussion. Pt identified take my dog for a walk or garden as coping skills. Pt interacted well with LRT and peers duration of session.    Plan: Continue to engage patient in RT group sessions 2-3x/week.   Jeoffrey BRAVO Celestia, LRT, CTRS 08/26/2023 1:40 PM

## 2023-08-26 NOTE — Group Note (Signed)
 Date:  08/26/2023 Time:  8:28 PM  Group Topic/Focus:  Wellness Toolbox:   The focus of this group is to discuss various aspects of wellness, balancing those aspects and exploring ways to increase the ability to experience wellness.  Patients will create a wellness toolbox for use upon discharge.    Participation Level:  Active  Participation Quality:  Appropriate, Attentive, Sharing, and Supportive  Affect:  Appropriate  Cognitive:  Appropriate  Insight: Appropriate and Good  Engagement in Group:  Engaged  Modes of Intervention:  Discussion and Role-play  Additional Comments:     Kerri Katz 08/26/2023, 8:28 PM

## 2023-08-27 NOTE — Progress Notes (Signed)
   08/27/23 0800  Psych Admission Type (Psych Patients Only)  Admission Status Voluntary  Psychosocial Assessment  Patient Complaints Anxiety  Eye Contact Brief  Facial Expression Flat  Affect Depressed  Speech Logical/coherent  Interaction Guarded  Motor Activity Slow  Appearance/Hygiene Unremarkable  Behavior Characteristics Cooperative  Mood Depressed  Thought Process  Coherency WDL  Content WDL  Delusions None reported or observed  Perception WDL  Hallucination None reported or observed  Judgment Impaired  Confusion None  Danger to Self  Current suicidal ideation? Denies  Agreement Not to Harm Self Yes  Description of Agreement verbal  Danger to Others  Danger to Others None reported or observed
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
## 2023-08-27 NOTE — Group Note (Signed)
 LCSW Group Therapy Note   Group Date: 08/27/2023 Start Time: 1300 End Time: 1400   Type of Therapy and Topic:  Group Therapy: Challenging Core Beliefs  Participation Level:  Active  Description of Group:  Patients were educated about core beliefs and asked to identify one harmful core belief that they have. Patients were asked to explore from where those beliefs originate. Patients were asked to discuss how those beliefs make them feel and the resulting behaviors of those beliefs. They were then be asked if those beliefs are true and, if so, what evidence they have to support them. Lastly, group members were challenged to replace those negative core beliefs with helpful beliefs.   Therapeutic Goals:   1. Patient will identify harmful core beliefs and explore the origins of such beliefs. 2. Patient will identify feelings and behaviors that result from those core beliefs. 3. Patient will discuss whether such beliefs are true. 4.  Patient will replace harmful core beliefs with helpful ones.  Summary of Patient Progress:  Patient actively engaged in processing and exploring how core beliefs are formed and how they impact thoughts, feelings, and behaviors. Patient proved open to input from peers and feedback from CSW. Patient demonstrated proficient insight into the subject matter, was respectful and supportive of peers, and participated throughout the entire session.  Therapeutic Modalities: Cognitive Behavioral Therapy; Solution-Focused Therapy   Evan Wilson M Evan Wilson, Evan Wilson 08/27/2023  2:46 PM

## 2023-08-27 NOTE — Group Note (Signed)
 Recreation Therapy Group Note   Group Topic:Stress Management  Group Date: 08/27/2023 Start Time: 1530 End Time: 1620 Facilitators: Celestia Jeoffrey FORBES ARTICE, CTRS Location: Craft Room  Group Description: Taboo. LRT and patients played the game Taboo. The object of the game is to have peers guess the word up at the top of the card drawn that is in bold, while being sure to not use any of the descriptive words down below it on the same card. If the person attempting to explain the word in bold uses any of the descriptive words down below, they lose their turn, and no one receives that card or point. LRT and patient's took turns being the one to describe the words while the rest of the group tried to guess what they were describing.   Goal Area(s) Addressed: Patient will identify physical symptoms of stress. Patient will identify emotional symptoms of stress. Patient will identify coping skills for stress. Patient will build frustration tolerance skills.  Patient will increase communication.   Affect/Mood: Appropriate   Participation Level: Active and Engaged   Participation Quality: Independent   Behavior: Appropriate, Calm, and Cooperative   Speech/Thought Process: Coherent   Insight: Good   Judgement: Good   Modes of Intervention: Cooperative Play and Group work   Patient Response to Interventions:  Attentive, Engaged, Interested , and Receptive   Education Outcome:  Acknowledges education   Clinical Observations/Individualized Feedback: Evan Wilson was active in their participation of session activities and group discussion. Pt interacted well with LRT and peers duration of session.    Plan: Continue to engage patient in RT group sessions 2-3x/week.   Jeoffrey FORBES Celestia, LRT, CTRS 08/27/2023 5:40 PM

## 2023-08-27 NOTE — Progress Notes (Signed)
 ASAHD CAN is a 48 y.o. male patient. No diagnosis found. Past Medical History:  Diagnosis Date   Anemia    Arthritis    spine   Arthritis    Arthritis    Hyperlipidemia    Current Facility-Administered Medications  Medication Dose Route Frequency Provider Last Rate Last Admin   haloperidol  (HALDOL ) tablet 5 mg  5 mg Oral TID PRN Onuoha, Chinwendu V, NP       And   diphenhydrAMINE  (BENADRYL ) capsule 50 mg  50 mg Oral TID PRN Onuoha, Chinwendu V, NP       haloperidol  lactate (HALDOL ) injection 5 mg  5 mg Intramuscular TID PRN Onuoha, Chinwendu V, NP       And   diphenhydrAMINE  (BENADRYL ) injection 50 mg  50 mg Intramuscular TID PRN Onuoha, Chinwendu V, NP       And   LORazepam  (ATIVAN ) injection 2 mg  2 mg Intramuscular TID PRN Onuoha, Chinwendu V, NP       haloperidol  lactate (HALDOL ) injection 10 mg  10 mg Intramuscular TID PRN Onuoha, Chinwendu V, NP       And   diphenhydrAMINE  (BENADRYL ) injection 50 mg  50 mg Intramuscular TID PRN Onuoha, Chinwendu V, NP       And   LORazepam  (ATIVAN ) injection 2 mg  2 mg Intramuscular TID PRN Onuoha, Chinwendu V, NP       gabapentin  (NEURONTIN ) capsule 400 mg  400 mg Oral TID Millington, Matthew E, PA-C   400 mg at 08/27/23 1657   mirtazapine  (REMERON ) tablet 7.5 mg  7.5 mg Oral QHS PRN Bobbitt, Shalon E, NP   7.5 mg at 08/27/23 2129   multivitamin with minerals tablet 1 tablet  1 tablet Oral Daily Onuoha, Chinwendu V, NP   1 tablet at 08/27/23 0829   OLANZapine  (ZYPREXA ) tablet 10 mg  10 mg Oral QHS Cleotilde Hoy HERO, NP   10 mg at 08/27/23 2130   OLANZapine  (ZYPREXA ) tablet 5 mg  5 mg Oral Daily Cleotilde Hoy HERO, NP   5 mg at 08/27/23 9170   oxyCODONE  (Oxy IR/ROXICODONE ) immediate release tablet 5 mg  5 mg Oral Q6H PRN Cleotilde Hoy HERO, NP   5 mg at 08/27/23 2129   sertraline  (ZOLOFT ) tablet 150 mg  150 mg Oral Daily Cleotilde Hoy HERO, NP   150 mg at 08/27/23 9170   thiamine  (VITAMIN B1) tablet 100 mg  100 mg Oral Daily Onuoha,  Chinwendu V, NP   100 mg at 08/27/23 9170   Allergies  Allergen Reactions   Trazodone Swelling   Toradol  [Ketorolac  Tromethamine ] Swelling and Other (See Comments)    Causes leg swelling   Tramadol  Swelling and Other (See Comments)    Leg swelling   Principal Problem:   MDD (major depressive disorder), recurrent, severe, with psychosis (HCC) Active Problems:   Alcohol use disorder, moderate, dependence (HCC)   Alcohol abuse  Blood pressure 129/81, pulse 89, temperature 98.4 F (36.9 C), resp. rate 16, height 5' 11 (1.803 m), weight 92.5 kg, SpO2 100%.  Subjective Objective: Vital signs: (most recent): Blood pressure 129/81, pulse 89, temperature 98.4 F (36.9 C), resp. rate 16, height 5' 11 (1.803 m), weight 92.5 kg, SpO2 100%.    Assessment & Plan Patient denies SI/ HI and anxiety this shift upon assessment. Patient  was  cooperative, He contracted verbally for safety. Patient reported he was screen to be enrolled at [facility] in [place] during the assessment this shift when  I brought up discharge plans. He verbalized he prefers [name] vs other temporary places. He participated in group and was part of the Milieu this shift.    Nikia Mangino B Ellie Bryand 08/27/2023

## 2023-08-27 NOTE — Group Note (Signed)
 Date:  08/27/2023 Time:  10:54 AM  Group Topic/Focus:  Making Healthy Choices:   The focus of this group is to help patients identify negative/unhealthy choices they were using prior to admission and identify positive/healthier coping strategies to replace them upon discharge.    Participation Level:  Active  Participation Quality:  Appropriate  Affect:  Appropriate  Cognitive:  Appropriate  Insight: Appropriate  Engagement in Group:  Engaged  Modes of Intervention:  Activity and Socialization  Additional Comments:    Deitra Caron Mainland 08/27/2023, 10:54 AM

## 2023-08-27 NOTE — Plan of Care (Signed)
   Problem: Education: Goal: Emotional status will improve Outcome: Progressing Goal: Mental status will improve Outcome: Progressing

## 2023-08-27 NOTE — Group Note (Signed)
 Date:  08/27/2023 Time:  8:52 PM  Group Topic/Focus:  Wrap-Up Group:   The focus of this group is to help patients review their daily goal of treatment and discuss progress on daily workbooks. Patients meditated and discussed positive things happening in life currently.    Participation Level:  Active  Participation Quality:  Appropriate  Affect:  Appropriate  Cognitive:  Appropriate  Insight: Appropriate  Engagement in Group:  Engaged  Modes of Intervention:  Activity  Additional Comments:    Leigh VEAR Pais 08/27/2023, 8:52 PM

## 2023-08-27 NOTE — BHH Counselor (Signed)
 CSW spoke with the patient on IOP referral.  Pt declined at this time, deferring to hear approvals or denials from [facility] and [facility].  CSW to follow up.  Sherryle Margo, MSW, LCSW 08/27/2023 3:25 PM

## 2023-08-27 NOTE — B[facility] Counselor (Signed)
 Patient requested referral for [facility] and [name].   CSW informed patient that referrals can be sent, but likely patient will be discharged prior to bed being offered following patient admitting to alcohol use and requesting SUD treatment towards end of admission.  Referrals have been sent.  CSW to follow up.   Sherryle Margo, MSW, LCSW 08/27/2023 8:59 AM

## 2023-08-27 NOTE — Plan of Care (Signed)
  Problem: Education: Goal: Knowledge of [facility] Education information/materials will improve Outcome: Completed/Met Goal: Mental status will improve Outcome: Adequate for Discharge   Problem: Activity: Goal: Interest or engagement in activities will improve Outcome: Progressing   Problem: Health Behavior/Discharge Planning: Goal: Identification of resources available to assist in meeting health care needs will improve Outcome: Progressing

## 2023-08-27 NOTE — BHH Counselor (Signed)
 CSW spoke with [name], application has not been reviewed at this time.   CSW spoke with [facility], pt's application has not been reviewed.  Sherryle Margo, MSW, LCSW 08/27/2023 4:15 PM

## 2023-08-27 NOTE — B[facility] Counselor (Signed)
 CSW was able to confirm that prescreen has been complete. Review has not occurred.  CSW asked to call back after 4pm.  Sherryle Margo, MSW, LCSW 08/27/2023 1:22 PM

## 2023-08-28 DIAGNOSIS — F333 Major depressive disorder, recurrent, severe with psychotic symptoms: Principal | ICD-10-CM

## 2023-08-28 NOTE — Group Note (Signed)
 Date:  08/28/2023 Time:  3:28 PM  Group Topic/Focus:  Wellness Toolbox:   The focus of this group is to discuss various aspects of wellness, balancing those aspects and exploring ways to increase the ability to experience wellness.  Patients will create a wellness toolbox for use upon discharge.    Participation Level:  Active  Participation Quality:  Appropriate  Affect:  Appropriate  Cognitive:  Alert  Insight: Appropriate  Engagement in Group:  Engaged  Modes of Intervention:  Activity and Discussion  Additional Comments:   Evan Wilson 08/28/2023, 3:28 PM

## 2023-08-28 NOTE — BH IP Treatment Plan (Signed)
 Interdisciplinary Treatment and Diagnostic Plan Update  08/28/2023 Time of Session: 2:00PM Evan Wilson MRN: [id number]  Principal Diagnosis: MDD (major depressive disorder), recurrent, severe, with psychosis (HCC)  Secondary Diagnoses: Principal Problem:   MDD (major depressive disorder), recurrent, severe, with psychosis (HCC) Active Problems:   Alcohol use disorder, moderate, dependence (HCC)   Alcohol abuse   Current Medications:  Current Facility-Administered Medications  Medication Dose Route Frequency Provider Last Rate Last Admin   haloperidol  (HALDOL ) tablet 5 mg  5 mg Oral TID PRN Onuoha, Chinwendu V, NP       And   diphenhydrAMINE  (BENADRYL ) capsule 50 mg  50 mg Oral TID PRN Onuoha, Chinwendu V, NP       haloperidol  lactate (HALDOL ) injection 5 mg  5 mg Intramuscular TID PRN Onuoha, Chinwendu V, NP       And   diphenhydrAMINE  (BENADRYL ) injection 50 mg  50 mg Intramuscular TID PRN Onuoha, Chinwendu V, NP       And   LORazepam  (ATIVAN ) injection 2 mg  2 mg Intramuscular TID PRN Onuoha, Chinwendu V, NP       haloperidol  lactate (HALDOL ) injection 10 mg  10 mg Intramuscular TID PRN Onuoha, Chinwendu V, NP       And   diphenhydrAMINE  (BENADRYL ) injection 50 mg  50 mg Intramuscular TID PRN Onuoha, Chinwendu V, NP       And   LORazepam  (ATIVAN ) injection 2 mg  2 mg Intramuscular TID PRN Onuoha, Chinwendu V, NP       gabapentin  (NEURONTIN ) capsule 400 mg  400 mg Oral TID Millington, Matthew E, PA-C   400 mg at 08/28/23 1216   mirtazapine  (REMERON ) tablet 7.5 mg  7.5 mg Oral QHS PRN Bobbitt, Shalon E, NP   7.5 mg at 08/27/23 2129   multivitamin with minerals tablet 1 tablet  1 tablet Oral Daily Onuoha, Chinwendu V, NP   1 tablet at 08/28/23 0830   OLANZapine  (ZYPREXA ) tablet 10 mg  10 mg Oral QHS Cleotilde Hoy HERO, NP   10 mg at 08/27/23 2130   oxyCODONE  (Oxy IR/ROXICODONE ) immediate release tablet 5 mg  5 mg Oral Q6H PRN Cleotilde Hoy HERO, NP   5 mg at 08/28/23 9167    sertraline  (ZOLOFT ) tablet 150 mg  150 mg Oral Daily Cleotilde Hoy HERO, NP   150 mg at 08/28/23 9167   thiamine  (VITAMIN B1) tablet 100 mg  100 mg Oral Daily Onuoha, Chinwendu V, NP   100 mg at 08/28/23 0831   PTA Medications: Medications Prior to Admission  Medication Sig Dispense Refill Last Dose/Taking   gabapentin  (NEURONTIN ) 300 MG capsule Take 2 capsules (600 mg total) by mouth 2 (two) times daily. 120 capsule 2 08/21/2023   Multiple Vitamin (MULTIVITAMIN) tablet Take 1 tablet by mouth daily.   08/21/2023   oxyCODONE  (ROXICODONE ) 5 MG immediate release tablet Take 1 tablet (5 mg total) by mouth every 6 (six) hours as needed for severe pain (pain score 7-10) or moderate pain (pain score 4-6). 10 tablet 0 08/21/2023   sertraline  (ZOLOFT ) 100 MG tablet Take 1 tablet (100 mg total) by mouth daily. 30 tablet 1 08/21/2023   thiamine  (VITAMIN B-1) 100 MG tablet Take 1 tablet (100 mg total) by mouth daily. 30 tablet 0 Unknown    Patient Stressors:    Patient Strengths:    Treatment Modalities: Medication Management, Group therapy, Case management,  1 to 1 session with clinician, Psychoeducation, Recreational therapy.   Physician Treatment Plan  for Primary Diagnosis: MDD (major depressive disorder), recurrent, severe, with psychosis (HCC) Long Term Goal(s): Improvement in symptoms so as ready for discharge   Short Term Goals: Ability to identify changes in lifestyle to reduce recurrence of condition will improve  Medication Management: Evaluate patient's response, side effects, and tolerance of medication regimen.  Therapeutic Interventions: 1 to 1 sessions, Unit Group sessions and Medication administration.  Evaluation of Outcomes: Progressing  Physician Treatment Plan for Secondary Diagnosis: Principal Problem:   MDD (major depressive disorder), recurrent, severe, with psychosis (HCC) Active Problems:   Alcohol use disorder, moderate, dependence (HCC)   Alcohol abuse  Long Term  Goal(s): Improvement in symptoms so as ready for discharge   Short Term Goals: Ability to identify changes in lifestyle to reduce recurrence of condition will improve     Medication Management: Evaluate patient's response, side effects, and tolerance of medication regimen.  Therapeutic Interventions: 1 to 1 sessions, Unit Group sessions and Medication administration.  Evaluation of Outcomes: Progressing   RN Treatment Plan for Primary Diagnosis: MDD (major depressive disorder), recurrent, severe, with psychosis (HCC) Long Term Goal(s): Knowledge of disease and therapeutic regimen to maintain health will improve  Short Term Goals: Ability to demonstrate self-control, Ability to participate in decision making will improve, Ability to verbalize feelings will improve, Ability to disclose and discuss suicidal ideas, Ability to identify and develop effective coping behaviors will improve, and Compliance with prescribed medications will improve  Medication Management: RN will administer medications as ordered by provider, will assess and evaluate patient's response and provide education to patient for prescribed medication. RN will report any adverse and/or side effects to prescribing provider.  Therapeutic Interventions: 1 on 1 counseling sessions, Psychoeducation, Medication administration, Evaluate responses to treatment, Monitor vital signs and CBGs as ordered, Perform/monitor CIWA, COWS, AIMS and Fall Risk screenings as ordered, Perform wound care treatments as ordered.  Evaluation of Outcomes: Progressing   LCSW Treatment Plan for Primary Diagnosis: MDD (major depressive disorder), recurrent, severe, with psychosis (HCC) Long Term Goal(s): Safe transition to appropriate next level of care at discharge, Engage patient in therapeutic group addressing interpersonal concerns.  Short Term Goals: Engage patient in aftercare planning with referrals and resources, Increase social support, Increase  ability to appropriately verbalize feelings, Increase emotional regulation, Facilitate acceptance of mental health diagnosis and concerns, Facilitate patient progression through stages of change regarding substance use diagnoses and concerns, Identify triggers associated with mental health/substance abuse issues, and Increase skills for wellness and recovery  Therapeutic Interventions: Assess for all discharge needs, 1 to 1 time with Social worker, Explore available resources and support systems, Assess for adequacy in community support network, Educate family and significant other(s) on suicide prevention, Complete Psychosocial Assessment, Interpersonal group therapy.  Evaluation of Outcomes: Progressing   Progress in Treatment: Attending groups: Yes. Participating in groups: Yes. Taking medication as prescribed: Yes. Toleration medication: Yes. Family/Significant other contact made: Yes, individual(s) contacted:  SPE completed with the patient's wife.  Patient understands diagnosis: Yes. Discussing patient identified problems/goals with staff: Yes. Medical problems stabilized or resolved: Yes. Denies suicidal/homicidal ideation: Yes. Issues/concerns per patient self-inventory: No. Other: none  New problem(s) identified: No, Describe:  None  Update 08/28/2023: No changes at this time.    New Short Term/Long Term Goal(s):detox, elimination of symptoms of psychosis, medication management for mood stabilization; elimination of SI thoughts; development of comprehensive mental wellness/sobriety plan.   Update 08/28/2023: No changes at this time.      Patient Goals:  To have  a level mind.   Update 08/28/2023: No changes at this time.    Discharge Plan or Barriers: CSW to assist with the development of appropriate discharge plan.   Update 08/28/2023:  Patient has been accepted to [facility].  Patient to return home, see outpatient providers in the interim.     Reason for Continuation of Hospitalization:  Anxiety Depression Hallucinations Suicidal ideation   Estimated Length of Stay: 1-7 days.  Update 08/28/2023: TBD  Last 3 [place] Suicide Severity Risk Score: Flowsheet Row Admission (Current) from 08/21/2023 in [facility] INPATIENT BEHAVIORAL MEDICINE Most recent reading at 08/23/2023 11:00 PM ED from 08/21/2023 in [facility] Most recent reading at 08/21/2023  5:15 PM ED from 08/21/2023 in [facility] Emergency Department at [facility] Most recent reading at 08/21/2023  8:52 AM  C-SSRS RISK CATEGORY Error: Q3, 4, or 5 should not be populated when Q2 is No High Risk No Risk    Last PHQ 2/9 Scores:    05/13/2023   10:05 AM 03/06/2023   11:32 AM 03/02/2023    2:34 PM  Depression screen PHQ 2/9  Decreased Interest 2 1 2   Down, Depressed, Hopeless 0 1 3  PHQ - 2 Score 2 2 5   Altered sleeping 0 1 3  Tired, decreased energy 2 0 1  Change in appetite 3 1 1   Feeling bad or failure about yourself  1 1 3   Trouble concentrating 3 1 2   Moving slowly or fidgety/restless 2 2   Suicidal thoughts 0 1 1  PHQ-9 Score 13 9 16   Difficult doing work/chores Not difficult at all Somewhat difficult     Scribe for Treatment Team: Sherryle JINNY Margo, KEN 08/28/2023 3:37 PM
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
## 2023-08-28 NOTE — Progress Notes (Signed)
   08/28/23 0820  Psych Admission Type (Psych Patients Only)  Admission Status Voluntary  Psychosocial Assessment  Patient Complaints None  Eye Contact Brief  Facial Expression Flat  Affect Appropriate to circumstance  Speech Logical/coherent  Interaction Assertive  Motor Activity Other (Comment) (WNL)  Appearance/Hygiene In scrubs  Behavior Characteristics Cooperative  Mood Pleasant  Thought Process  Coherency WDL  Content WDL  Delusions None reported or observed  Perception WDL  Hallucination None reported or observed  Judgment Poor  Confusion None  Danger to Self  Current suicidal ideation? Denies  Agreement Not to Harm Self Yes  Description of Agreement verbal  Danger to Others  Danger to Others None reported or observed

## 2023-08-28 NOTE — Group Note (Signed)
 Date:  08/28/2023 Time:  10:19 AM  Group Topic/Focus:  Goals Group:   The focus of this group is to help patients establish daily goals to achieve during treatment and discuss how the patient can incorporate goal setting into their daily lives to aide in recovery.    Participation Level:  Active  Participation Quality:  Appropriate  Affect:  Appropriate  Cognitive:  Alert  Insight: Appropriate  Engagement in Group:  Engaged  Modes of Intervention:  Activity, Discussion, and Education  Additional Comments:    Evan Wilson 08/28/2023, 10:19 AM

## 2023-08-28 NOTE — Plan of Care (Signed)
  Problem: Education: Goal: Emotional status will improve Outcome: Progressing Goal: Verbalization of understanding the information provided will improve Outcome: Progressing   Problem: Activity: Goal: Interest or engagement in activities will improve Outcome: Progressing Goal: Sleeping patterns will improve Outcome: Progressing

## 2023-08-28 NOTE — Plan of Care (Signed)
   Problem: Education: Goal: Emotional status will improve Outcome: Progressing Goal: Mental status will improve Outcome: Progressing Goal: Verbalization of understanding the information provided will improve Outcome: Progressing

## 2023-08-28 NOTE — Group Note (Signed)
 [facility] LCSW Group Therapy Note   Group Date: 08/28/2023 Start Time: 1300 End Time: 1330   Type of Therapy/Topic:  Group Therapy:  Emotion Regulation  Participation Level:  Active   Mood:  Description of Group:    The purpose of this group is to assist patients in learning to regulate negative emotions and experience positive emotions. Patients will be guided to discuss ways in which they have been vulnerable to their negative emotions. These vulnerabilities will be juxtaposed with experiences of positive emotions or situations, and patients challenged to use positive emotions to combat negative ones. Special emphasis will be placed on coping with negative emotions in conflict situations, and patients will process healthy conflict resolution skills.  Therapeutic Goals: Patient will identify two positive emotions or experiences to reflect on in order to balance out negative emotions:  Patient will label two or more emotions that they find the most difficult to experience:  Patient will be able to demonstrate positive conflict resolution skills through discussion or role plays:   Summary of Patient Progress: Patient was present for the entirety of the group process. He was actively engaged in the discussion. Pt appeared to  have some insight into the topic. He appeared open and receptive to feedback/comments from both his peers and the facilitator.    Therapeutic Modalities:   Cognitive Behavioral Therapy Feelings Identification Dialectical Behavioral Therapy   Nadara JONELLE Fam, LCSW

## 2023-08-28 NOTE — Progress Notes (Signed)
 Pt calm and pleasant during assessment denying SI/HI/AVH. Pt observed interacting appropriately with staff and peers on the unit. Pt observed by this Clinical research associate interacting appropriately with staff and peers on the unit. Pt compliant with medication administration per MD orders. Pt given education, support, and encouragement to be active in his treatment plan. Pt being monitored Q 15 minutes for safety per unit protocol, remains safe on the unit

## 2023-08-28 NOTE — Group Note (Signed)
 Date:  08/28/2023 Time:  10:14 PM  Group Topic/Focus:  Wrap-Up Group:   The focus of this group is to help patients review their daily goal of treatment and discuss progress on daily workbooks.    Participation Level:  Active  Participation Quality:  Sharing  Affect:  Appropriate  Cognitive:  Appropriate  Insight: Good  Engagement in Group:  Engaged  Modes of Intervention:  Discussion  Additional Comments:  [name] was a 7/10. He spoke with his care team today and pushed his discharge date back in hopes to miss the partying occurring this weekend due to it being a holiday. He does not want to fall back into bad habits. Overall he is feeling great.\  Evan Wilson 08/28/2023, 10:14 PM

## 2023-08-29 DIAGNOSIS — F333 Major depressive disorder, recurrent, severe with psychotic symptoms: Principal | ICD-10-CM
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
## 2023-08-29 NOTE — Group Note (Signed)
 [facility] LCSW Group Therapy Note   Group Date: 08/29/2023 Start Time: 1300 End Time: 1400   Type of Therapy/Topic:  Group Therapy:  Balance in Life  Participation Level:  Active   Description of Group:    This group will address the concept of balance and how it feels and looks when one is unbalanced. Patients will be encouraged to process areas in their lives that are out of balance, and identify reasons for remaining unbalanced. Facilitators will guide patients utilizing problem- solving interventions to address and correct the stressor making their life unbalanced. Understanding and applying boundaries will be explored and addressed for obtaining  and maintaining a balanced life. Patients will be encouraged to explore ways to assertively make their unbalanced needs known to significant others in their lives, using other group members and facilitator for support and feedback.  Therapeutic Goals: Patient will identify two or more emotions or situations they have that consume much of in their lives. Patient will identify signs/triggers that life has become out of balance:  Patient will identify two ways to set boundaries in order to achieve balance in their lives:  Patient will demonstrate ability to communicate their needs through discussion and/or role plays  Summary of Patient Progress: Patient was present in group.  Paitnet was active and supportive of other group members.  Patient often required redirection due to monopolization of conversation, however, overall was appropriate in discussion topics.  Patient shared how he has been trying to change his peer group and the events that he would be exposed to in an effort to limit his exposure to substances.    Therapeutic Modalities:   Cognitive Behavioral Therapy Solution-Focused Therapy Assertiveness Training   Sherryle JINNY Margo, LCSW

## 2023-08-29 NOTE — Group Note (Signed)
 Date:  08/29/2023 Time:  11:09 AM  Group Topic/Focus:  Self Care:   The focus of this group is to help patients understand the importance of self-care in order to improve or restore emotional, physical, spiritual, interpersonal, and financial health.    Participation Level:  Did Not Attend   Evan Wilson 08/29/2023, 11:09 AM

## 2023-08-29 NOTE — Group Note (Signed)
 Recreation Therapy Group Note   Group Topic:Healthy Support Systems  Group Date: 08/29/2023 Start Time: 1530 End Time: 1620 Facilitators: Celestia Jeoffrey FORBES ARTICE, CTRS Location: Craft Room  Group Description: Straw Bridge. In groups or individually, patients were given 10 plastic drinking straws and an equal length of masking tape. Using the materials provided, patients were instructed to build a free-standing bridge-like structure to suspend an everyday item (ex: deck of cards) off the floor or table surface. All materials were required to be used in Secondary school teacher. LRT facilitated post-activity discussion reviewing the importance of having strong and healthy support systems in our lives. LRT discussed how the people in our lives serve as the tape and the deck of cards we placed on top of our straw structure are the stressors we face in daily life. LRT and pts discussed what happens in our life when things get too heavy for us , and we don't have strong supports outside of the hospital. Pt shared 2 of their healthy supports in their life aloud in the group.   Goal Area(s) Addressed:  Patient will identify 2 healthy supports in their life. Patient will identify skills to successfully complete activity. Patient will identify correlation of this activity to life post-discharge.  Patient will build on frustration tolerance skills. Patient will increase team building and communication skills.    Affect/Mood: Appropriate   Participation Level: Active and Engaged   Participation Quality: Independent   Behavior: Appropriate, Calm, and Cooperative   Speech/Thought Process: Coherent   Insight: Good   Judgement: Good   Modes of Intervention: STEM Activity   Patient Response to Interventions:  Attentive, Engaged, Interested , and Receptive   Education Outcome:  Acknowledges education   Clinical Observations/Individualized Feedback: Evan Wilson was active in their participation of session activities  and group discussion. Pt identified family and my dog as healthy supports. Pt interacted well with LRT and peers duration of session.    Plan: Continue to engage patient in RT group sessions 2-3x/week.   [address] LRT, CTRS 08/29/2023 5:20 PM

## 2023-08-29 NOTE — Progress Notes (Signed)
 08:00-20:00  Pt received prn Oxycodone  for jaw pain, effective on follow up. No falls, no behavioral problems this shift, denied avh/hi/si, has been social with peers,attended groups and participated appropriately. Med compliant. He is being monitored as ordered.    08/29/23 1347  Psych Admission Type (Psych Patients Only)  Admission Status Voluntary  Psychosocial Assessment  Patient Complaints None  Eye Contact Brief  Facial Expression Flat  Affect Appropriate to circumstance  Speech Logical/coherent  Interaction Assertive  Motor Activity Slow  Appearance/Hygiene Unremarkable  Behavior Characteristics Cooperative  Mood Depressed  Thought Process  Coherency WDL  Content WDL  Delusions None reported or observed  Perception WDL  Hallucination None reported or observed  Judgment Impaired  Confusion None  Danger to Self  Current suicidal ideation? Denies  Self-Injurious Behavior No self-injurious ideation or behavior indicators observed or expressed   Agreement Not to Harm Self Yes  Description of Agreement Verbal  Danger to Others  Danger to Others None reported or observed

## 2023-08-29 NOTE — [facility] Counselor (Signed)
 CSW attempted to contact patient's probation officer, Meagan Daye, [phone] to inform that PO would need to complete release with the patient.  CSW was also going to inform that the assessment requested by PO would need to be obtained via Medical Records.  CSW left HIPAA compliant voicemail requesting a return call.   CSW awaiting call back.  CSW staffed with [facility] Supervisor Delphine D.  Sherryle Margo, MSW, LCSW 08/29/2023 3:39 PM

## 2023-08-29 NOTE — Plan of Care (Signed)
   Problem: Education: Goal: Emotional status will improve Outcome: Progressing Goal: Mental status will improve Outcome: Progressing

## 2023-08-29 NOTE — Group Note (Signed)
 Recreation Therapy Group Note   Group Topic:Health and Wellness  Group Date: 08/29/2023 Start Time: 0950 End Time: 1050 Facilitators: Celestia Jeoffrey BRAVO, LRT, CTRS Location: Courtyard  Group Description: [employer]. LRT and patients played games of basketball, drew with chalk, and played corn hole while outside in the courtyard while getting fresh air and sunlight. Music was being played in the background. LRT and peers conversed about different games they have played before, what they do in their free time and anything else that is on their minds. LRT encouraged pts to drink water after being outside, sweating and getting their heart rate up.  Goal Area(s) Addressed: Patient will build on frustration tolerance skills. Patients will partake in a competitive play game with peers. Patients will gain knowledge of new leisure interest/hobby.    Affect/Mood: Appropriate   Participation Level: Minimal    Clinical Observations/Individualized Feedback: Reed came late to group. Pt was present for less than half of the session.   Plan: Continue to engage patient in RT group sessions 2-3x/week.   Jeoffrey BRAVO Celestia, LRT, CTRS 08/29/2023 11:07 AM

## 2023-08-29 NOTE — Plan of Care (Signed)
   Problem: Education: Goal: Emotional status will improve Outcome: Progressing Goal: Mental status will improve Outcome: Progressing Goal: Verbalization of understanding the information provided will improve Outcome: Progressing

## 2023-08-29 NOTE — Group Note (Signed)
 Date:  08/29/2023 Time:  9:34 PM  Group Topic/Focus:  Wrap-Up Group:   The focus of this group is to help patients review their daily goal of treatment and discuss progress on daily workbooks.    Participation Level:  Active  Participation Quality:  Appropriate and Attentive  Affect:  Appropriate  Cognitive:  Alert and Appropriate  Insight: Appropriate  Engagement in Group:  Engaged  Modes of Intervention:  Discussion  Additional Comments:     Wilson,Evan Lindon E 08/29/2023, 9:34 PM

## 2023-08-30 NOTE — Plan of Care (Signed)
   Problem: Education: Goal: Mental status will improve Outcome: Progressing Goal: Verbalization of understanding the information provided will improve Outcome: Progressing   Problem: Activity: Goal: Interest or engagement in activities will improve Outcome: Progressing

## 2023-08-30 NOTE — Progress Notes (Signed)
   08/30/23 0900  Psych Admission Type (Psych Patients Only)  Admission Status Voluntary  Psychosocial Assessment  Patient Complaints None (patient reports that his depression and anxiety is ok at the moment.)  Eye Contact Fair  Facial Expression Pained  Affect Appropriate to circumstance  Speech Logical/coherent  Interaction Assertive  Motor Activity Slow  Appearance/Hygiene In scrubs  Behavior Characteristics Cooperative;Appropriate to situation  Mood Pleasant  Aggressive Behavior  Effect No apparent injury  Thought Process  Coherency WDL  Content WDL  Delusions None reported or observed  Perception WDL  Hallucination None reported or observed  Judgment WDL  Confusion None  Danger to Self  Current suicidal ideation? Denies  Self-Injurious Behavior No self-injurious ideation or behavior indicators observed or expressed   Agreement Not to Harm Self Yes  Description of Agreement Verbal  Danger to Others  Danger to Others None reported or observed   Patient's goal for today, per his self-inventory is attending all of our classes, in which support from staff, will help him achieve his goal.
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
## 2023-08-30 NOTE — Progress Notes (Signed)
   08/29/23 1956  Psych Admission Type (Psych Patients Only)  Admission Status Voluntary  Psychosocial Assessment  Patient Complaints None  Eye Contact Brief;Intense  Facial Expression Flat  Affect Appropriate to circumstance  Speech Logical/coherent  Interaction Assertive  Motor Activity Slow  Appearance/Hygiene Unremarkable  Behavior Characteristics Cooperative  Mood Depressed  Aggressive Behavior  Effect No apparent injury  Thought Process  Coherency WDL  Content WDL  Delusions None reported or observed  Perception WDL  Hallucination None reported or observed  Judgment Impaired  Confusion None  Danger to Self  Current suicidal ideation? Denies  Self-Injurious Behavior No self-injurious ideation or behavior indicators observed or expressed   Agreement Not to Harm Self Yes  Description of Agreement verbal  Danger to Others  Danger to Others None reported or observed

## 2023-08-30 NOTE — Group Note (Signed)
 Date:  08/30/2023 Time:  2:31 PM  Group Topic/Focus:  Healthy Communication:   The focus of this group is to discuss communication, barriers to communication, as well as healthy ways to communicate with others.    Participation Level:  Active  Participation Quality:  Appropriate  Affect:  Appropriate  Cognitive:  Appropriate  Insight: Appropriate  Engagement in Group:  Engaged  Modes of Intervention:  Activity  Additional Comments:    [name] Berdene Askari 08/30/2023, 2:31 PM

## 2023-08-30 NOTE — Group Note (Signed)
 Recreation Therapy Group Note   Group Topic:Leisure Education  Group Date: 08/30/2023 Start Time: 1300 End Time: 1355 Facilitators: Celestia Jeoffrey BRAVO, LRT, CTRS Location: Craft Room  Group Description: Leisure. Patients were given the option to choose from singing karaoke, coloring mandalas, using oil pastels, journaling, or playing with play-doh. LRT and pts discussed the meaning of leisure, the importance of participating in leisure during their free time/when they're outside of the hospital, as well as how our leisure interests can also serve as coping skills.   Goal Area(s) Addressed:  Patient will identify a current leisure interest.  Patient will learn the definition of "leisure". Patient will practice making a positive decision. Patient will have the opportunity to try a new leisure activity. Patient will communicate with peers and LRT.    Affect/Mood: Appropriate   Participation Level: Active and Engaged   Participation Quality: Independent   Behavior: Appropriate, Calm, and Cooperative   Speech/Thought Process: Coherent   Insight: Good   Judgement: Good   Modes of Intervention: Clarification, Education, and Exploration   Patient Response to Interventions:  Attentive, Engaged, Interested , and Receptive   Education Outcome:  Acknowledges education   Clinical Observations/Individualized Feedback: [facility] was active in their participation of session activities and group discussion. Pt identified tend to my flowers and walk on trails as things he does in his free time. Pt chose to draw while in group.    Plan: Continue to engage patient in RT group sessions 2-3x/week.   Jeoffrey BRAVO Celestia, LRT, CTRS 08/30/2023 2:46 PM

## 2023-08-30 NOTE — Plan of Care (Signed)
  Problem: Education: Goal: Emotional status will improve Outcome: Progressing Goal: Mental status will improve Outcome: Progressing Goal: Verbalization of understanding the information provided will improve Outcome: Progressing   Problem: Activity: Goal: Interest or engagement in activities will improve Outcome: Progressing Goal: Sleeping patterns will improve Outcome: Progressing   Problem: Coping: Goal: Ability to verbalize frustrations and anger appropriately will improve Outcome: Progressing Goal: Ability to demonstrate self-control will improve Outcome: Progressing   Problem: Health Behavior/Discharge Planning: Goal: Identification of resources available to assist in meeting health care needs will improve Outcome: Progressing Goal: Compliance with treatment plan for underlying cause of condition will improve Outcome: Progressing   Problem: Physical Regulation: Goal: Ability to maintain clinical measurements within normal limits will improve Outcome: Progressing   Problem: Safety: Goal: Periods of time without injury will increase Outcome: Progressing

## 2023-08-30 NOTE — Group Note (Signed)
 Recreation Therapy Group Note   Group Topic:General Recreation  Group Date: 08/30/2023 Start Time: 1030 End Time: 1130 Facilitators: Celestia Jeoffrey BRAVO, LRT, CTRS Location: Courtyard  Group Description: [employer]. LRT and patients played games of basketball, drew with chalk, and played corn hole while outside in the courtyard while getting fresh air and sunlight. Music was being played in the background. LRT and peers conversed about different games they have played before, what they do in their free time and anything else that is on their minds. LRT encouraged pts to drink water after being outside, sweating and getting their heart rate up.  Goal Area(s) Addressed: Patient will build on frustration tolerance skills. Patients will partake in a competitive play game with peers. Patients will gain knowledge of new leisure interest/hobby.    Affect/Mood: Appropriate   Participation Level: Active   Participation Quality: Independent   Behavior: Appropriate   Speech/Thought Process: Coherent   Insight: Good   Judgement: Good   Modes of Intervention: Activity   Patient Response to Interventions:  Receptive   Education Outcome:  Acknowledges education   Clinical Observations/Individualized Feedback: Koree was active in their participation of session activities and group discussion. Pt interacted well with LRT and peers duration of session.    Plan: Continue to engage patient in RT group sessions 2-3x/week.   Jeoffrey BRAVO Celestia, LRT, CTRS 08/30/2023 11:47 AM

## 2023-08-30 NOTE — Progress Notes (Deleted)
  [facility] Adult Case Management Discharge Plan :  Will you be returning to the same living situation after discharge:  Yes,  Patient to return home.  At discharge, do you have transportation home?: Yes,  CSW has arranged taxi services on patient's behalf.  Do you have the ability to pay for your medications: Yes,  [employer] / [employer]  Release of information consent forms completed and in the chart;  Patient's signature needed at discharge.  Patient to Follow up at:  Follow-up Information     [facility] Follow up.   Specialty: Addiction Medicine Why: Bed available 09/04/2023 at 10:00AM Contact information: [address] [place] [address] [phone]         [facility] [place] Follow up.   Specialty: Behavioral Health Why: Appointment is scheduled for Shuvon Rankin on 09/02/2023 at 3:30PM.  Appointment is VIRTUAL.  MUST cancel 24 hours in advance.  Therapy is scheduled with Jerel Pepper 10/01/2023 at [facility] therapy.  Appointment is VIRTUAL.  MUST cancel 24 hours in advance. Contact information: [address] [place]  [address] [phone]                Next level of care provider has access to [facility] Link:yes  Safety Planning and Suicide Prevention discussed: Yes,  Education Completed;  Harlene Barge, wife, [phone],  (name of family member/significant other) has been identified by the patient as the family member/significant other with whom the patient will be residing, and identified as the person(s) who will aid the patient in the event of a mental health crisis (suicidal ideations/suicide attempt).  With written consent from the patient, the family member/significant other has been provided the following suicide prevention education, prior to the and/or following the discharge of the patient.     Has patient been referred to the  Quitline?: Patient does not use tobacco/nicotine products  Patient has been referred for addiction treatment: Yes, the patient will follow up with an outpatient provider for substance use disorder. Psychiatrist/APP: appointment made and Therapist: appointment made  Alveta CHRISTELLA Kerns, LCSW 08/30/2023, 8:58 AM

## 2023-08-31 MED ORDER — OLANZAPINE 10 MG PO TABS: 10.0000 mg | ORAL_TABLET | Freq: Every day | ORAL | 0 refills | Status: DC

## 2023-08-31 MED ORDER — MIRTAZAPINE 7.5 MG PO TABS: 7.5000 mg | ORAL_TABLET | Freq: Every evening | ORAL | 0 refills | Status: DC | PRN

## 2023-08-31 MED ORDER — SERTRALINE HCL 100 MG PO TABS: 150.0000 mg | ORAL_TABLET | Freq: Every day | ORAL | 0 refills | Status: DC

## 2023-08-31 MED ORDER — GABAPENTIN 400 MG PO CAPS: 400.0000 mg | ORAL_CAPSULE | Freq: Three times a day (TID) | ORAL | 0 refills | Status: DC

## 2023-08-31 NOTE — Progress Notes (Signed)
 Patient ready for discharge. His belongings were returned to him prior to him leaving the unit. Patient was escorted by staff to medical mall entrance. Patient is being transported to his son's residence by way of taxi.

## 2023-08-31 NOTE — Progress Notes (Signed)
   08/30/23 2000  Psych Admission Type (Psych Patients Only)  Admission Status Voluntary  Psychosocial Assessment  Patient Complaints None  Eye Contact Fair  Facial Expression Pained  Affect Appropriate to circumstance  Speech Logical/coherent  Interaction Assertive  Motor Activity Slow  Appearance/Hygiene In scrubs  Behavior Characteristics Cooperative;Appropriate to situation  Mood Pleasant  Aggressive Behavior  Effect No apparent injury  Thought Process  Coherency WDL  Content WDL  Delusions None reported or observed  Perception WDL  Hallucination None reported or observed  Judgment WDL  Confusion None  Danger to Self  Current suicidal ideation? Denies  Self-Injurious Behavior No self-injurious ideation or behavior indicators observed or expressed   Agreement Not to Harm Self Yes  Description of Agreement verbal  Danger to Others  Danger to Others None reported or observed   Patient is alert and oriented x 4, affect is flat and congruent with mood, patient brightens upon approach interacting appropriately with peers and peers no distress noted. 15 minutes safety checks maintained will continue to monitor.

## 2023-08-31 NOTE — [facility]HH Suicide Risk Assessment (Signed)
 [facility] Discharge Suicide Risk Assessment   Principal Problem: MDD (major depressive disorder), recurrent, severe, with psychosis (HCC) Discharge Diagnoses: Principal Problem:   MDD (major depressive disorder), recurrent, severe, with psychosis (HCC) Active Problems:   Alcohol use disorder, moderate, dependence (HCC)   Alcohol abuse   Total Time spent with patient: 1 hour  Musculoskeletal: Strength & Muscle Tone: within normal limits Gait & Station: normal Patient leans: N/A  Psychiatric Specialty Exam  Presentation  General Appearance:  Casual  Eye Contact: Fair  Speech: Clear and Coherent  Speech Volume: Normal  Handedness: -- (Not assessed)   Mood and Affect  Mood: Euthymic  Duration of Depression Symptoms: Greater than two weeks  Affect: Congruent   Thought Process  Thought Processes: Coherent  Descriptions of Associations:Intact  Orientation:Full (Time, Place and Person)  Thought Content:Logical  History of Schizophrenia/Schizoaffective disorder:No  Duration of Psychotic Symptoms:Greater than six months  Hallucinations:Hallucinations: None  Ideas of Reference:None  Suicidal Thoughts:Suicidal Thoughts: No  Homicidal Thoughts:Homicidal Thoughts: No   Sensorium  Memory: Immediate Fair; Recent Fair  Judgment: Good  Insight: Good   Executive Functions  Concentration: Good  Attention Span: Good  Recall: Good  Fund of Knowledge: Good  Language: Good   Psychomotor Activity  Psychomotor Activity: Psychomotor Activity: Normal   Assets  Assets: Communication Skills; Desire for Improvement   Sleep  Sleep: Sleep: Good  Estimated Sleeping Duration (Last 24 Hours): 6.75-8.00 hours  Physical Exam: Physical Exam Vitals and nursing note reviewed.  HENT:     Head: Atraumatic.  Eyes:     Extraocular Movements: Extraocular movements intact.  Pulmonary:     Effort: Pulmonary effort is normal.  Neurological:      Mental Status: He is alert and oriented to person, place, and time.    Review of Systems  Psychiatric/Behavioral:  Negative for hallucinations, substance abuse and suicidal ideas. The patient is not nervous/anxious and does not have insomnia.    Blood pressure 112/65, pulse 75, temperature (!) 97.3 F (36.3 C), resp. rate 18, height 5' 11 (1.803 m), weight 92.5 kg, SpO2 98%. Body mass index is 28.45 kg/m.  Mental Status Per Nursing Assessment::   On Admission:  Suicidal ideation indicated by patient (Reports AVH, voices to harm self)  Demographic Factors:  Male and Caucasian  Loss Factors: NA  Historical Factors: Impulsivity  Risk Reduction Factors:   Living with another person, especially a relative, Positive social support, Positive coping skills or problem solving skills, and Scheduled to attend treatment next week.   Continued Clinical Symptoms:  Alcohol/Substance Abuse/Dependencies  Cognitive Features That Contribute To Risk:  None    Suicide Risk:  Minimal: No identifiable suicidal ideation.  Patients presenting with no risk factors but with morbid ruminations; may be classified as minimal risk based on the severity of the depressive symptoms   Follow-up Information     [facility] Follow up.   Specialty: Addiction Medicine Why: Bed available 09/04/2023 at 10:00AM Contact information: [address] [place] [address] [phone]         [facility] [place] Follow up.   Specialty: Behavioral Health Why: Appointment is scheduled for Shuvon Rankin on 09/02/2023 at 3:30PM.  Appointment is VIRTUAL.  MUST cancel 24 hours in advance.  Therapy is scheduled with Jerel Pepper 10/01/2023 at [facility] therapy.  Appointment is VIRTUAL.  MUST cancel 24 hours in advance. Contact information: [address] [place]  [address] [phone]  Plan Of  Care/Follow-up recommendations:  # It is recommended to the patient to continue psychiatric medications as prescribed, after discharge from the hospital.   # It is recommended to the patient to follow up with your outpatient psychiatric provider and PCP. # It was discussed with the patient, the impact of alcohol, drugs, tobacco have been there overall psychiatric and medical wellbeing, and total abstinence from substance use was recommended. # Prescriptions provided or sent directly to preferred pharmacy at discharge. Patient agreeable to plan. Given the opportunity to ask questions. Appears to feel comfortable with discharge.  # In the event of worsening symptoms, the patient is instructed to call the crisis hotline (988), 911 and or go to the nearest ED for appropriate evaluation and treatment of symptoms. To follow-up with primary care provider for other medical issues, concerns and or health care needs # Patient was discharged home as requested with a plan to follow up as noted above.    Donnice FORBES Right, PA-C 08/31/2023, 11:22 AM

## 2023-08-31 NOTE — Plan of Care (Signed)
  Problem: Education: Goal: Emotional status will improve Outcome: Progressing   Problem: Education: Goal: Verbalization of understanding the information provided will improve Outcome: Progressing   Problem: Activity: Goal: Interest or engagement in activities will improve Outcome: Progressing

## 2023-08-31 NOTE — Progress Notes (Signed)
  [facility] Adult Case Management Discharge Plan :  Will you be returning to the same living situation after discharge:  Yes,  [address]  [place] At discharge, do you have transportation home?: No. Do you have the ability to pay for your medications: Yes,  The patient stated yes.  Release of information consent forms completed and in the chart;  Patient's signature needed at discharge.  Patient to Follow up at:  Follow-up Information     [facility] Follow up.   Specialty: Addiction Medicine Why: Bed available 09/04/2023 at 10:00AM Contact information: [address] [place] [address] [phone]         [facility] [place] Follow up.   Specialty: Behavioral Health Why: Appointment is scheduled for Shuvon Rankin on 09/02/2023 at 3:30PM.  Appointment is VIRTUAL.  MUST cancel 24 hours in advance.  Therapy is scheduled with Jerel Pepper 10/01/2023 at [facility] therapy.  Appointment is VIRTUAL.  MUST cancel 24 hours in advance. Contact information: [address] [place]  [address] [phone]                Next level of care provider has access to [facility] Link:yes  Safety Planning and Suicide Prevention discussed: Yes,  Harlene Barge, wife, [phone]     Has patient been referred to the Quitline?: Patient refused referral for treatment  Patient has been referred for addiction treatment: Yes, the patient will follow up with an outpatient provider for substance use disorder. Psychiatrist/APP: appointment made and Therapist: appointment made  Roselyn GORMAN Lento, LCSW 08/31/2023, 11:25 AM
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
## 2023-09-02 ENCOUNTER — Telehealth (HOSPITAL_COMMUNITY): Payer: MEDICAID | Admitting: Registered Nurse

## 2023-09-02 ENCOUNTER — Telehealth (HOSPITAL_COMMUNITY): Payer: Self-pay | Admitting: *Deleted

## 2023-09-02 NOTE — Telephone Encounter (Signed)
 Called number on file and was not able to reach patient to resch appt due to provider being out of office. Unable to leave message.

## 2023-09-03 ENCOUNTER — Ambulatory Visit: Payer: MEDICAID | Admitting: Podiatry

## 2023-09-18 ENCOUNTER — Telehealth (HOSPITAL_COMMUNITY): Payer: Self-pay | Admitting: *Deleted

## 2023-09-18 NOTE — Telephone Encounter (Signed)
 Opened in Error.

## 2023-09-19 ENCOUNTER — Encounter (HOSPITAL_COMMUNITY): Payer: Self-pay | Admitting: Registered Nurse

## 2023-09-19 ENCOUNTER — Telehealth (HOSPITAL_COMMUNITY): Payer: Self-pay

## 2023-09-19 ENCOUNTER — Telehealth (HOSPITAL_COMMUNITY): Payer: MEDICAID | Admitting: Registered Nurse

## 2023-09-19 DIAGNOSIS — F1021 Alcohol dependence, in remission: Secondary | ICD-10-CM | POA: Diagnosis not present

## 2023-09-19 MED ORDER — OLANZAPINE 10 MG PO TABS: 10.0000 mg | ORAL_TABLET | Freq: Every day | ORAL | 1 refills | Status: DC

## 2023-09-19 MED ORDER — SERTRALINE HCL 100 MG PO TABS: 150.0000 mg | ORAL_TABLET | Freq: Every day | ORAL | 1 refills | Status: DC

## 2023-09-19 MED ORDER — MIRTAZAPINE 7.5 MG PO TABS: 7.5000 mg | ORAL_TABLET | Freq: Every day | ORAL | 0 refills | Status: DC

## 2023-09-19 MED ORDER — GABAPENTIN 400 MG PO CAPS: 400.0000 mg | ORAL_CAPSULE | Freq: Three times a day (TID) | ORAL | 1 refills | Status: DC

## 2023-09-19 NOTE — Telephone Encounter (Signed)
 Therapist receives a referral for this pt for [facility] IOP. He finished detox at [facility] and then went to [facility] for residential treatment. Therapist calls him and confirms his identity be obtaining two verifiers.  He is scheduled for 09-27-23 to come in at 8:30 am for paperwork and then a [facility] at 9:00am  Darice Simpler, MS, LMFT, LCAS.
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
## 2023-09-19 NOTE — Patient Instructions (Addendum)

## 2023-09-27 ENCOUNTER — Ambulatory Visit (INDEPENDENT_AMBULATORY_CARE_PROVIDER_SITE_OTHER): Payer: MEDICAID

## 2023-09-27 ENCOUNTER — Encounter (HOSPITAL_COMMUNITY): Payer: Self-pay

## 2023-09-27 DIAGNOSIS — F1020 Alcohol dependence, uncomplicated: Secondary | ICD-10-CM

## 2023-09-27 DIAGNOSIS — F333 Major depressive disorder, recurrent, severe with psychotic symptoms: Secondary | ICD-10-CM

## 2023-09-27 DIAGNOSIS — F4310 Post-traumatic stress disorder, unspecified: Secondary | ICD-10-CM

## 2023-09-27 DIAGNOSIS — F411 Generalized anxiety disorder: Secondary | ICD-10-CM
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
## 2023-09-30 ENCOUNTER — Telehealth (HOSPITAL_COMMUNITY): Payer: Self-pay

## 2023-09-30 ENCOUNTER — Other Ambulatory Visit: Payer: Self-pay | Admitting: Family Medicine

## 2023-09-30 ENCOUNTER — Ambulatory Visit (HOSPITAL_COMMUNITY): Payer: MEDICAID

## 2023-09-30 NOTE — Telephone Encounter (Signed)
 Copied from CRM [id number]. Topic: Clinical - Medication Refill >> Sep 30, 2023  8:47 AM Aleatha C wrote: Medication:  gabapentin  (NEURONTIN ) 400 MG capsule and Cyclobenzapine 5mg  tablet   Has the patient contacted their pharmacy? Yes (Agent: If no, request that the patient contact the pharmacy for the refill. If patient does not wish to contact the pharmacy document the reason why and proceed with request.) (Agent: If yes, when and what did the pharmacy advise?)  This is the patient's preferred pharmacy:  [id number] [address], [place] - [address] AT [facility] [address] [facility] [address] [place] [address] Phone: [phone] Fax: [phone]  Is this the correct pharmacy for this prescription? Yes If no, delete pharmacy and type the correct one.   Has the prescription been filled recently? No  Is the patient out of the medication? Yes  Has the patient been seen for an appointment in the last year OR does the patient have an upcoming appointment? Yes  Can we respond through MyChart? No  Agent: Please be advised that Rx refills may take up to 3 business days. We ask that you follow-up with your pharmacy.

## 2023-09-30 NOTE — Telephone Encounter (Signed)
 Evan Wilson called and left a message at 8:32 am that he had to work from 11-7 so he would not be n IOP today.    This therapist calls Evan Wilson back and he answers.  Therapist confirms his identity by obtaining two identifiers.  Therapist inquires about Evan Wilson's work schedule.  He says he did not get his work schedule until the weekend. He says he was not able to change it for today, but has now made arrangements to be able to be in group on Monday, Wednesday and Friday from here on out.  He says he will be in group this Wednesday.  Darice Simpler, MS, LMFT, LCAS

## 2023-10-01 ENCOUNTER — Ambulatory Visit (HOSPITAL_COMMUNITY): Payer: MEDICAID | Admitting: Clinical

## 2023-10-02 ENCOUNTER — Encounter (HOSPITAL_COMMUNITY): Payer: Self-pay | Admitting: Medical

## 2023-10-02 ENCOUNTER — Ambulatory Visit (INDEPENDENT_AMBULATORY_CARE_PROVIDER_SITE_OTHER): Payer: MEDICAID | Admitting: Medical

## 2023-10-02 VITALS — BP 136/87 | HR 76 | Ht 71.0 in | Wt 216.0 lb

## 2023-10-02 DIAGNOSIS — S0183XS Puncture wound without foreign body of other part of head, sequela: Secondary | ICD-10-CM

## 2023-10-02 DIAGNOSIS — F333 Major depressive disorder, recurrent, severe with psychotic symptoms: Secondary | ICD-10-CM

## 2023-10-02 DIAGNOSIS — F1020 Alcohol dependence, uncomplicated: Secondary | ICD-10-CM

## 2023-10-02 DIAGNOSIS — F411 Generalized anxiety disorder: Secondary | ICD-10-CM

## 2023-10-02 DIAGNOSIS — F4310 Post-traumatic stress disorder, unspecified: Secondary | ICD-10-CM

## 2023-10-02 DIAGNOSIS — Z91199 Patient's noncompliance with other medical treatment and regimen due to unspecified reason: Secondary | ICD-10-CM

## 2023-10-02 DIAGNOSIS — S0450XS Injury of facial nerve, unspecified side, sequela: Secondary | ICD-10-CM

## 2023-10-02 MED ORDER — BACLOFEN 10 MG PO TABS: 10.0000 mg | ORAL_TABLET | Freq: Three times a day (TID) | ORAL | 2 refills | Status: DC

## 2023-10-02 NOTE — Progress Notes (Signed)
 Daily Group Progress Note   Program: CD IOP     Group Time: 9 a.m. to 12 p.m.      Type of Therapy: Process and Psychoeducational    Topic: The therapist checks in with group members, assess for SI/HI/psychosis and overall level of functioning. The therapists inquire about sobriety date and number of community support meetings attended since last session.   The therapist introduce the new group member.  Therapists asks group members to give an overview of what led them to CD-IOP so the new member could get to know them.  Therapist discusses the [place] curve, including the components of the progression of the disease, nothing there is a crucial Phase and a Chronic Phase.  Therapist points out that 1 out of 10 drinkers/substance users have the brain disease of addiction.  Therapist prompts discussion on the topics that fall under that crucial and Chronic Phases of Addiction.  Summary:  This is Evan Wilson's first session. He rates his anxiety and depression as a 0. He reports his sobriety date as 08-21-23.  He has not attended meetings yet or explored a sponsor.  He identifies his emotion today as excited.    He says he has been repairing things around the house.  He says he is excited about doing this program.  Evan Wilson says rather than using money to buy alcohol, he has starting putting the money he would have bought alcohol into an account for his son and it is set up so that no one can touch it until he turns 48 years of age.    Progress Towards Goals:  reports sobriety date as 08-21-23  UDS collected: Yes Results: No   AA/NA attended?:  No   Sponsor?:  No     Darice Simpler, MS, LMFT, LCAS 10/02/2023
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
## 2023-10-04 ENCOUNTER — Ambulatory Visit (HOSPITAL_COMMUNITY): Payer: MEDICAID

## 2023-10-04 ENCOUNTER — Telehealth (HOSPITAL_COMMUNITY): Payer: Self-pay | Admitting: Licensed Clinical Social Worker

## 2023-10-04 NOTE — Telephone Encounter (Signed)
 The therapist attempts to reach Evan Wilson leaving a HIPAA-compliant voicemail with a woman who answers his phone.  Zell Maier, MA, LCSW, [facility], LCAS 10/04/2023

## 2023-10-07 ENCOUNTER — Ambulatory Visit (HOSPITAL_COMMUNITY): Payer: MEDICAID

## 2023-10-07 LAB — TOXICOLOGY SCREEN, URINE: Creatinine, POC: 107 mg/dL

## 2023-10-09 ENCOUNTER — Ambulatory Visit (HOSPITAL_COMMUNITY): Payer: MEDICAID

## 2023-10-11 ENCOUNTER — Ambulatory Visit (HOSPITAL_COMMUNITY): Payer: MEDICAID

## 2023-10-14 ENCOUNTER — Ambulatory Visit (HOSPITAL_COMMUNITY): Payer: MEDICAID

## 2023-10-15 ENCOUNTER — Ambulatory Visit (HOSPITAL_COMMUNITY)
Admission: EM | Admit: 2023-10-15 | Discharge: 2023-10-16 | Disposition: A | Discharge status: Other Acute Inpatient Hospital | Payer: MEDICAID | Attending: Psychiatry | Admitting: Psychiatry

## 2023-10-15 DIAGNOSIS — R440 Auditory hallucinations: Secondary | ICD-10-CM | POA: Insufficient documentation

## 2023-10-15 DIAGNOSIS — F1010 Alcohol abuse, uncomplicated: Secondary | ICD-10-CM | POA: Diagnosis not present

## 2023-10-15 DIAGNOSIS — R45851 Suicidal ideations: Secondary | ICD-10-CM | POA: Diagnosis not present

## 2023-10-15 DIAGNOSIS — Z9151 Personal history of suicidal behavior: Secondary | ICD-10-CM | POA: Insufficient documentation

## 2023-10-15 DIAGNOSIS — F4310 Post-traumatic stress disorder, unspecified: Secondary | ICD-10-CM | POA: Insufficient documentation

## 2023-10-15 LAB — POCT URINE DRUG SCREEN - MANUAL ENTRY (I-SCREEN)
POC Amphetamine UR: NOT DETECTED
POC Buprenorphine (BUP): NOT DETECTED
POC Cocaine UR: NOT DETECTED
POC Marijuana UR: NOT DETECTED
POC Methadone UR: NOT DETECTED
POC Methamphetamine UR: NOT DETECTED
POC Morphine: NOT DETECTED
POC Oxazepam (BZO): NOT DETECTED
POC Oxycodone UR: NOT DETECTED
POC Secobarbital (BAR): NOT DETECTED

## 2023-10-15 LAB — CBC WITH DIFFERENTIAL/PLATELET
Abs Immature Granulocytes: 0.13 K/uL — ABNORMAL HIGH (ref 0.00–0.07)
Basophils Absolute: 0.1 K/uL (ref 0.0–0.1)
Basophils Relative: 1 %
Eosinophils Absolute: 0.2 K/uL (ref 0.0–0.5)
Eosinophils Relative: 2 %
HCT: 33 % — ABNORMAL LOW (ref 39.0–52.0)
Hemoglobin: 11 g/dL — ABNORMAL LOW (ref 13.0–17.0)
Immature Granulocytes: 1 %
Lymphocytes Relative: 21 %
Lymphs Abs: 2.5 K/uL (ref 0.7–4.0)
MCH: 30 pg (ref 26.0–34.0)
MCHC: 33.3 g/dL (ref 30.0–36.0)
MCV: 89.9 fL (ref 80.0–100.0)
Monocytes Absolute: 1.3 K/uL — ABNORMAL HIGH (ref 0.1–1.0)
Monocytes Relative: 11 %
Neutro Abs: 7.9 K/uL — ABNORMAL HIGH (ref 1.7–7.7)
Neutrophils Relative %: 64 %
Platelets: 237 K/uL (ref 150–400)
RBC: 3.67 MIL/uL — ABNORMAL LOW (ref 4.22–5.81)
RDW: 17.8 % — ABNORMAL HIGH (ref 11.5–15.5)
WBC: 12.2 K/uL — ABNORMAL HIGH (ref 4.0–10.5)
nRBC: 0 % (ref 0.0–0.2)

## 2023-10-15 LAB — COMPREHENSIVE METABOLIC PANEL WITH GFR
ALT: 59 U/L — ABNORMAL HIGH (ref 0–44)
AST: 93 U/L — ABNORMAL HIGH (ref 15–41)
Albumin: 2.9 g/dL — ABNORMAL LOW (ref 3.5–5.0)
Alkaline Phosphatase: 114 U/L (ref 38–126)
Anion gap: 7 (ref 5–15)
BUN: 6 mg/dL (ref 6–20)
CO2: 21 mmol/L — ABNORMAL LOW (ref 22–32)
Calcium: 8.8 mg/dL — ABNORMAL LOW (ref 8.9–10.3)
Chloride: 111 mmol/L (ref 98–111)
Creatinine, Ser: 0.7 mg/dL (ref 0.61–1.24)
GFR, Estimated: >60 mL/min (ref >60)
Glucose, Bld: 89 mg/dL (ref 70–99)
Potassium: 3.9 mmol/L (ref 3.5–5.1)
Sodium: 139 mmol/L (ref 135–145)
Total Bilirubin: 1.4 mg/dL — ABNORMAL HIGH (ref 0.0–1.2)
Total Protein: 7 g/dL (ref 6.5–8.1)

## 2023-10-15 LAB — TSH: TSH: 1.133 u[IU]/mL (ref 0.350–4.500)

## 2023-10-15 LAB — ETHANOL: Alcohol, Ethyl (B): 115 mg/dL — ABNORMAL HIGH (ref <15)

## 2023-10-15 MED ORDER — OLANZAPINE 10 MG IM SOLR: 5.0000 mg | Freq: Three times a day (TID) | INTRAMUSCULAR | Status: DC | PRN

## 2023-10-15 MED ORDER — OLANZAPINE 10 MG IM SOLR: 10.0000 mg | Freq: Three times a day (TID) | INTRAMUSCULAR | Status: DC | PRN

## 2023-10-15 MED ORDER — ALUM & MAG HYDROXIDE-SIMETH 200-200-20 MG/5ML PO SUSP: 30.0000 mL | ORAL | Status: DC | PRN

## 2023-10-15 MED ORDER — MAGNESIUM HYDROXIDE 400 MG/5ML PO SUSP: 30.0000 mL | Freq: Every day | ORAL | Status: DC | PRN

## 2023-10-15 MED ORDER — DIPHENHYDRAMINE HCL 50 MG PO CAPS: 50.0000 mg | ORAL_CAPSULE | Freq: Three times a day (TID) | ORAL | Status: DC | PRN

## 2023-10-15 MED ORDER — ACETAMINOPHEN 325 MG PO TABS: 650.0000 mg | ORAL_TABLET | Freq: Four times a day (QID) | ORAL | Status: DC | PRN

## 2023-10-15 MED ORDER — HALOPERIDOL 5 MG PO TABS: 5.0000 mg | ORAL_TABLET | Freq: Three times a day (TID) | ORAL | Status: DC | PRN

## 2023-10-15 NOTE — ED Provider Notes (Signed)
 [facility] Continuous Assessment Admission H&P  Date: 10/15/23 Patient Name: Evan Wilson MRN: [id number] Chief Complaint: suicidal ideation   Diagnoses:  Final diagnoses:  Suicidal ideation  Alcohol  abuse  Auditory hallucination    HPI: Evan Wilson, 48 y/o male with a history of alcohol  abuse, MDD, PTSD, suicidal ideation.  Presented to [facility] via [facility].  According to the patient he is suicidal with a plan to have the cops shoot him.  According to reported that today patient had a butcher knife and the bat in his hand when the cops came to his resident today and he was begging the cops to kill him.  Patient also reports that he drinks 2 40 ounce can of beer.  According to him he had stopped drinking but he started drinking today.  Per the patient he keeps hearing the voices in his head telling him to have the police kill him.  Patient stated that he is tired of the voices in his head as putting him in an pain.  Currently see a med provider with the [facility] outpatient [facility] in [place] .  And is currently prescribed medications.  Face-to-face evaluation of patient, patient is alert and oriented x 4, speech is clear, maintained minimal eye contact.  Patient is observed laying on the chair.  Very nonchalant when asked questions and does not take what he is doing seriously.  Patient endorsed suicidal ideations with plans to have the cops shoot him.  Patient also reports hearing voices.  Denies paranoia.  Patient reports drinking 2 40 ounce beer.  Patient denies using any other illicit drug use at this time.  According to the patient he lives with his wife and his children.  Denies access to guns at this time.  Given patient prior history of suicide attempts and current presentation.  Recommend inpatient admission when a bed becomes available for now patient will be housed in the observation unit  Total Time spent with patient: 30 minutes  Musculoskeletal  Strength &  Muscle Tone: within normal limits Gait & Station: normal Patient leans: N/A  Psychiatric Specialty Exam  Presentation General Appearance:  Casual  Eye Contact: Fair  Speech: Clear and Coherent  Speech Volume: Normal  Handedness: Right   Mood and Affect  Mood: Euthymic  Affect: Congruent   Thought Process  Thought Processes: Coherent  Descriptions of Associations:Intact  Orientation:Full (Time, Place and Person)  Thought Content:WDL  Diagnosis of Schizophrenia or Schizoaffective disorder in past: No   Hallucinations:Hallucinations: Auditory Description of Auditory Hallucinations: voices telling him to let the cop kill him  Ideas of Reference:None  Suicidal Thoughts:Suicidal Thoughts: Yes, Active SI Active Intent and/or Plan: With Intent; With Plan  Homicidal Thoughts:Homicidal Thoughts: No   Sensorium  Memory: Immediate Fair  Judgment: Poor  Insight: Poor   Executive Functions  Concentration: Fair  Attention Span: Fair  Recall: [employer] of Knowledge: Fair  Language: Fair   Psychomotor Activity  Psychomotor Activity: Psychomotor Activity: Normal   Assets  Assets: Desire for Improvement; Resilience; Vocational/Educational   Sleep  Sleep: Sleep: Fair Number of Hours of Sleep: 7   Nutritional Assessment (For OBS and FBC admissions only) Has the patient had a weight loss or gain of 10 pounds or more in the last 3 months?: No Has the patient had a decrease in food intake/or appetite?: No Does the patient have dental problems?: No Does the patient have eating habits or behaviors that may be indicators of an eating  disorder including binging or inducing vomiting?: No Has the patient recently lost weight without trying?: 0 Has the patient been eating poorly because of a decreased appetite?: 0 Malnutrition Screening Tool Score: 0    Physical Exam HENT:     Head: Normocephalic.     Nose: Nose normal.  Eyes:      Pupils: Pupils are equal, round, and reactive to light.  Cardiovascular:     Rate and Rhythm: Normal rate.  Pulmonary:     Effort: Pulmonary effort is normal.  Musculoskeletal:        General: Normal range of motion.     Cervical back: Normal range of motion.  Neurological:     General: No focal deficit present.     Mental Status: He is alert.  Psychiatric:        Mood and Affect: Mood normal.        Behavior: Behavior normal.        Thought Content: Thought content normal.        Judgment: Judgment normal.    Review of Systems  Constitutional: Negative.   HENT: Negative.    Eyes: Negative.   Respiratory: Negative.    Cardiovascular: Negative.   Gastrointestinal: Negative.   Genitourinary: Negative.   Musculoskeletal: Negative.   Skin: Negative.   Neurological: Negative.   Psychiatric/Behavioral:  Positive for substance abuse and suicidal ideas. The patient is nervous/anxious.     Blood pressure 130/78, pulse 86, temperature 98.7 F (37.1 C), temperature source Oral, resp. rate 16, SpO2 99%. There is no height or weight on file to calculate BMI.  Past Psychiatric History: PTSD, suicidal ideation, MDD, alcohol  abuse  Is the patient at risk to self? Yes  Has the patient been a risk to self in the past 6 months? Yes .    Has the patient been a risk to self within the distant past? Yes   Is the patient a risk to others? No   Has the patient been a risk to others in the past 6 months? No   Has the patient been a risk to others within the distant past? No   Past Medical History: See chart  Family History: Unknown  Social History: Alcohol   Last Labs:  Admission on 08/21/2023, Discharged on 08/21/2023  Component Date Value Ref Range Status   WBC 08/21/2023 9.7  4.0 - 10.5 K/uL Final   RBC 08/21/2023 3.91 (L)  4.22 - 5.81 MIL/uL Final   Hemoglobin 08/21/2023 11.6 (L)  13.0 - 17.0 g/dL Final   HCT 93/74/7974 35.4 (L)  39.0 - 52.0 % Final   MCV 08/21/2023 90.5  80.0 -  100.0 fL Final   MCH 08/21/2023 29.7  26.0 - 34.0 pg Final   MCHC 08/21/2023 32.8  30.0 - 36.0 g/dL Final   RDW 93/74/7974 16.5 (H)  11.5 - 15.5 % Final   Platelets 08/21/2023 240  150 - 400 K/uL Final   nRBC 08/21/2023 0.0  0.0 - 0.2 % Final   Neutrophils Relative % 08/21/2023 69  % Final   Neutro Abs 08/21/2023 6.7  1.7 - 7.7 K/uL Final   Lymphocytes Relative 08/21/2023 16  % Final   Lymphs Abs 08/21/2023 1.6  0.7 - 4.0 K/uL Final   Monocytes Relative 08/21/2023 13  % Final   Monocytes Absolute 08/21/2023 1.2 (H)  0.1 - 1.0 K/uL Final   Eosinophils Relative 08/21/2023 1  % Final   Eosinophils Absolute 08/21/2023 0.1  0.0 - 0.5 K/uL Final  Basophils Relative 08/21/2023 1  % Final   Basophils Absolute 08/21/2023 0.1  0.0 - 0.1 K/uL Final   Immature Granulocytes 08/21/2023 0  % Final   Abs Immature Granulocytes 08/21/2023 0.03  0.00 - 0.07 K/uL Final   Performed at [facility], [address], [place], [place] [address]   Sodium 08/21/2023 139  135 - 145 mmol/L Final   Potassium 08/21/2023 4.5  3.5 - 5.1 mmol/L Final   Chloride 08/21/2023 106  98 - 111 mmol/L Final   CO2 08/21/2023 27  22 - 32 mmol/L Final   Glucose, Bld 08/21/2023 105 (H)  70 - 99 mg/dL Final   Glucose reference range applies only to samples taken after fasting for at least 8 hours.   BUN 08/21/2023 7  6 - 20 mg/dL Final   Creatinine, Ser 08/21/2023 0.80  0.61 - 1.24 mg/dL Final   Calcium 93/74/7974 8.5 (L)  8.9 - 10.3 mg/dL Final   Total Protein 93/74/7974 7.1  6.5 - 8.1 g/dL Final   Albumin 93/74/7974 3.1 (L)  3.5 - 5.0 g/dL Final   AST 93/74/7974 104 (H)  15 - 41 U/L Final   ALT 08/21/2023 55 (H)  0 - 44 U/L Final   Alkaline Phosphatase 08/21/2023 100  38 - 126 U/L Final   Total Bilirubin 08/21/2023 1.9 (H)  0.0 - 1.2 mg/dL Final   GFR, Estimated 08/21/2023 >60  >60 mL/min Final   Comment: (NOTE) Calculated using the CKD-EPI Creatinine Equation (2021)    Anion gap 08/21/2023 6  5 - 15 Final    Performed at [facility], [address], [place], [place] [address]   Hgb A1c MFr Bld 08/21/2023 4.4 (L)  4.8 - 5.6 % Final   Comment: (NOTE) Diagnosis of Diabetes The following HbA1c ranges recommended by the American Diabetes Association (ADA) may be used as an aid in the diagnosis of diabetes mellitus.  Hemoglobin             Suggested A1C NGSP%              Diagnosis  <5.7                   Non Diabetic  5.7-6.4                Pre-Diabetic  >6.4                   Diabetic  <7.0                   Glycemic control for                       adults with diabetes.     Mean Plasma Glucose 08/21/2023 79.58  mg/dL Final   Performed at [facility], [address] [place], [place] [address]   Alcohol , Ethyl (B) 08/21/2023 <15  <15 mg/dL Final   Comment: (NOTE) For medical purposes only. Performed at [facility], [address] [place], [place] [address]    Cholesterol 08/21/2023 149  0 - 200 mg/dL Final   Triglycerides 93/74/7974 47  <150 mg/dL Final   HDL 93/74/7974 56  >40 mg/dL Final   Total CHOL/HDL Ratio 08/21/2023 2.7  RATIO Final   VLDL 08/21/2023 9  0 - 40 mg/dL Final   LDL Cholesterol 08/21/2023 84  0 - 99 mg/dL Final   Comment:  Total Cholesterol/HDL:CHD Risk Coronary Heart Disease Risk Table                     Men   Women  1/2 Average Risk   3.4   3.3  Average Risk       5.0   4.4  2 X Average Risk   9.6   7.1  3 X Average Risk  23.4   11.0        Use the calculated Patient Ratio above and the CHD Risk Table to determine the patient's CHD Risk.        ATP III CLASSIFICATION (LDL):  <100     mg/dL   Optimal  899-870  mg/dL   Near or Above                    Optimal  130-159  mg/dL   Borderline  839-810  mg/dL   High  >809     mg/dL   Very High Performed at [facility], [address] [place], [place] [address]    TSH 08/21/2023 0.812  0.350 - 4.500 uIU/mL Final   Comment: Performed by a 3rd Generation assay with a  functional sensitivity of <=0.01 uIU/mL. Performed at [facility], [address], [place], [place] [address]    POC Amphetamine UR 08/21/2023 None Detected  NONE DETECTED (Cut Off Level 1000 ng/mL) Final   POC Secobarbital (BAR) 08/21/2023 None Detected  NONE DETECTED (Cut Off Level 300 ng/mL) Final   POC Buprenorphine (BUP) 08/21/2023 None Detected  NONE DETECTED (Cut Off Level 10 ng/mL) Final   POC Oxazepam (BZO) 08/21/2023 Positive (A)  NONE DETECTED (Cut Off Level 300 ng/mL) Final   POC Cocaine UR 08/21/2023 None Detected  NONE DETECTED (Cut Off Level 300 ng/mL) Final   POC Methamphetamine UR 08/21/2023 None Detected  NONE DETECTED (Cut Off Level 1000 ng/mL) Final   POC Morphine 08/21/2023 None Detected  NONE DETECTED (Cut Off Level 300 ng/mL) Final   POC Methadone UR 08/21/2023 None Detected  NONE DETECTED (Cut Off Level 300 ng/mL) Final   POC Oxycodone  UR 08/21/2023 None Detected  NONE DETECTED (Cut Off Level 100 ng/mL) Final   POC Marijuana UR 08/21/2023 None Detected  NONE DETECTED (Cut Off Level 50 ng/mL) Final   Vit D, 25-Hydroxy 08/21/2023 40.14  30 - 100 ng/mL Final   Comment: (NOTE) Vitamin D  deficiency has been defined by the Institute of Medicine  and an Endocrine Society practice guideline as a level of serum 25-OH  vitamin D  less than 20 ng/mL (1,2). The Endocrine Society went on to  further define vitamin D  insufficiency as a level between 21 and 29  ng/mL (2).  1. IOM (Institute of Medicine). 2010. Dietary reference intakes for  calcium and D. [place]: [employer]. 2. Holick MF, Binkley [place], Bischoff-Ferrari HA, et al. Evaluation,  treatment, and prevention of vitamin D  deficiency: an Endocrine  Society clinical practice guideline, JCEM. 2011 Jul; 96(7): 1911-30.  Performed at [facility], [address] [place], [place] [address]     Allergies: Trazodone, Toradol  [ketorolac  tromethamine ], and Tramadol   Medications:  Facility  Ordered Medications  Medication   acetaminophen  (TYLENOL ) tablet 650 mg   alum & mag hydroxide-simeth (MAALOX/MYLANTA) 200-200-20 MG/5ML suspension 30 mL   magnesium  hydroxide (MILK OF MAGNESIA) suspension 30 mL   haloperidol  (HALDOL ) tablet 5 mg   And   diphenhydrAMINE  (BENADRYL ) capsule  50 mg   OLANZapine  (ZYPREXA ) injection 5 mg   OLANZapine  (ZYPREXA ) injection 10 mg   PTA Medications  Medication Sig   Multiple Vitamin (MULTIVITAMIN) tablet Take 1 tablet by mouth daily.   thiamine  (VITAMIN B-1) 100 MG tablet Take 1 tablet (100 mg total) by mouth daily.   oxyCODONE  (ROXICODONE ) 5 MG immediate release tablet Take 1 tablet (5 mg total) by mouth every 6 (six) hours as needed for severe pain (pain score 7-10) or moderate pain (pain score 4-6).   sertraline  (ZOLOFT ) 100 MG tablet Take 1.5 tablets (150 mg total) by mouth daily.   OLANZapine  (ZYPREXA ) 10 MG tablet Take 1 tablet (10 mg total) by mouth at bedtime.   mirtazapine  (REMERON ) 7.5 MG tablet Take 1 tablet (7.5 mg total) by mouth at bedtime.   gabapentin  (NEURONTIN ) 400 MG capsule Take 1 capsule (400 mg total) by mouth 3 (three) times daily.   baclofen  (LIORESAL ) 10 MG tablet Take 1 tablet (10 mg total) by mouth 3 (three) times daily.      Medical Decision Making  Inpatient    Recommendations  Based on my evaluation the patient does not appear to have an emergency medical condition.  Gaither Pouch, NP 10/15/23  9:08 PM
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
## 2023-10-16 ENCOUNTER — Encounter (HOSPITAL_COMMUNITY): Payer: Self-pay

## 2023-10-16 ENCOUNTER — Encounter (HOSPITAL_COMMUNITY): Payer: Self-pay | Admitting: Psychiatry

## 2023-10-16 ENCOUNTER — Encounter (HOSPITAL_COMMUNITY): Payer: Self-pay | Admitting: Medical

## 2023-10-16 ENCOUNTER — Other Ambulatory Visit: Payer: Self-pay

## 2023-10-16 ENCOUNTER — Inpatient Hospital Stay (HOSPITAL_COMMUNITY)
Admission: AD | Admit: 2023-10-16 | Discharge: 2023-10-19 | DRG: 885 | Disposition: A | Discharge status: Home / Self Care | Payer: MEDICAID | Source: Intra-hospital

## 2023-10-16 ENCOUNTER — Ambulatory Visit (HOSPITAL_COMMUNITY): Payer: MEDICAID

## 2023-10-16 DIAGNOSIS — Z885 Allergy status to narcotic agent status: Secondary | ICD-10-CM

## 2023-10-16 DIAGNOSIS — Z886 Allergy status to analgesic agent status: Secondary | ICD-10-CM | POA: Diagnosis not present

## 2023-10-16 DIAGNOSIS — Z5982 Transportation insecurity: Secondary | ICD-10-CM | POA: Diagnosis not present

## 2023-10-16 DIAGNOSIS — G8929 Other chronic pain: Secondary | ICD-10-CM | POA: Diagnosis present

## 2023-10-16 DIAGNOSIS — Z8249 Family history of ischemic heart disease and other diseases of the circulatory system: Secondary | ICD-10-CM | POA: Diagnosis not present

## 2023-10-16 DIAGNOSIS — Z818 Family history of other mental and behavioral disorders: Secondary | ICD-10-CM

## 2023-10-16 DIAGNOSIS — F4312 Post-traumatic stress disorder, chronic: Secondary | ICD-10-CM | POA: Diagnosis present

## 2023-10-16 DIAGNOSIS — R609 Edema, unspecified: Secondary | ICD-10-CM | POA: Insufficient documentation

## 2023-10-16 DIAGNOSIS — E785 Hyperlipidemia, unspecified: Secondary | ICD-10-CM | POA: Diagnosis present

## 2023-10-16 DIAGNOSIS — G4700 Insomnia, unspecified: Secondary | ICD-10-CM | POA: Diagnosis present

## 2023-10-16 DIAGNOSIS — Z823 Family history of stroke: Secondary | ICD-10-CM | POA: Diagnosis not present

## 2023-10-16 DIAGNOSIS — X838XXS Intentional self-harm by other specified means, sequela: Secondary | ICD-10-CM

## 2023-10-16 DIAGNOSIS — Z79899 Other long term (current) drug therapy: Secondary | ICD-10-CM

## 2023-10-16 DIAGNOSIS — Z888 Allergy status to other drugs, medicaments and biological substances status: Secondary | ICD-10-CM

## 2023-10-16 DIAGNOSIS — F4310 Post-traumatic stress disorder, unspecified: Secondary | ICD-10-CM | POA: Diagnosis present

## 2023-10-16 DIAGNOSIS — F515 Nightmare disorder: Secondary | ICD-10-CM | POA: Diagnosis not present

## 2023-10-16 DIAGNOSIS — F329 Major depressive disorder, single episode, unspecified: Secondary | ICD-10-CM | POA: Diagnosis present

## 2023-10-16 DIAGNOSIS — Z9151 Personal history of suicidal behavior: Secondary | ICD-10-CM | POA: Diagnosis not present

## 2023-10-16 DIAGNOSIS — E669 Obesity, unspecified: Secondary | ICD-10-CM | POA: Diagnosis present

## 2023-10-16 DIAGNOSIS — F250 Schizoaffective disorder, bipolar type: Principal | ICD-10-CM | POA: Diagnosis present

## 2023-10-16 DIAGNOSIS — F1020 Alcohol dependence, uncomplicated: Secondary | ICD-10-CM | POA: Diagnosis present

## 2023-10-16 DIAGNOSIS — Z6830 Body mass index (BMI) 30.0-30.9, adult: Secondary | ICD-10-CM | POA: Diagnosis not present

## 2023-10-16 DIAGNOSIS — T1491XS Suicide attempt, sequela: Secondary | ICD-10-CM | POA: Diagnosis not present

## 2023-10-16 DIAGNOSIS — I10 Essential (primary) hypertension: Secondary | ICD-10-CM | POA: Diagnosis present

## 2023-10-16 DIAGNOSIS — R45851 Suicidal ideations: Secondary | ICD-10-CM | POA: Diagnosis present

## 2023-10-16 HISTORY — DX: Post-traumatic stress disorder, unspecified: F43.10

## 2023-10-16 HISTORY — DX: Intentional self-harm by other specified means, initial encounter: X83.8XXA

## 2023-10-16 HISTORY — DX: Personal history of other mental and behavioral disorders: Z86.59

## 2023-10-16 HISTORY — DX: Nightmare disorder: F51.5

## 2023-10-16 HISTORY — DX: Post-traumatic stress disorder, chronic: F43.12

## 2023-10-16 HISTORY — DX: Schizoaffective disorder, bipolar type: F25.0

## 2023-10-16 HISTORY — DX: Intentional self-harm by unspecified firearm discharge, initial encounter: X74.9XXA

## 2023-10-16 LAB — HIV ANTIBODY (ROUTINE TESTING W REFLEX): HIV Screen 4th Generation wRfx: NONREACTIVE

## 2023-10-16 LAB — FOLATE: Folate: 13.9 ng/mL (ref >5.9)

## 2023-10-16 MED ORDER — ONDANSETRON 4 MG PO TBDP: 4.0000 mg | ORAL_TABLET | Freq: Four times a day (QID) | ORAL | Status: DC | PRN

## 2023-10-16 MED ORDER — LORAZEPAM 1 MG PO TABS: 1.0000 mg | ORAL_TABLET | Freq: Four times a day (QID) | ORAL | Status: DC | PRN

## 2023-10-16 MED ORDER — HALOPERIDOL LACTATE 5 MG/ML IJ SOLN: 5.0000 mg | Freq: Three times a day (TID) | INTRAMUSCULAR | Status: DC | PRN

## 2023-10-16 MED ORDER — HYDROXYZINE HCL 25 MG PO TABS: 25.0000 mg | ORAL_TABLET | Freq: Four times a day (QID) | ORAL | Status: DC | PRN

## 2023-10-16 MED ORDER — QUETIAPINE FUMARATE 100 MG PO TABS
100.0000 mg | ORAL_TABLET | Freq: Every day | ORAL | Status: DC
  Administered 2023-10-16 – 2023-10-17 (×2): 100 mg via ORAL
  Filled 2023-10-16 (×2): qty 1

## 2023-10-16 MED ORDER — LORAZEPAM 2 MG/ML IJ SOLN: 2.0000 mg | Freq: Three times a day (TID) | INTRAMUSCULAR | Status: DC | PRN

## 2023-10-16 MED ORDER — ADULT MULTIVITAMIN W/MINERALS CH: 1.0000 | ORAL_TABLET | Freq: Every day | ORAL | Status: DC

## 2023-10-16 MED ORDER — DIPHENHYDRAMINE HCL 50 MG/ML IJ SOLN: 50.0000 mg | Freq: Three times a day (TID) | INTRAMUSCULAR | Status: DC | PRN

## 2023-10-16 MED ORDER — HALOPERIDOL 5 MG PO TABS: 5.0000 mg | ORAL_TABLET | Freq: Three times a day (TID) | ORAL | Status: DC | PRN

## 2023-10-16 MED ORDER — CLONIDINE HCL 0.1 MG PO TABS
0.1000 mg | ORAL_TABLET | Freq: Once | ORAL | Status: DC
  Filled 2023-10-16: qty 1

## 2023-10-16 MED ORDER — CLONIDINE HCL 0.1 MG PO TABS: 0.1000 mg | ORAL_TABLET | Freq: Four times a day (QID) | ORAL | Status: DC | PRN

## 2023-10-16 MED ORDER — CHLORDIAZEPOXIDE HCL 25 MG PO CAPS: 25.0000 mg | ORAL_CAPSULE | Freq: Four times a day (QID) | ORAL | Status: DC | PRN

## 2023-10-16 MED ORDER — HALOPERIDOL LACTATE 5 MG/ML IJ SOLN: 10.0000 mg | Freq: Three times a day (TID) | INTRAMUSCULAR | Status: DC | PRN

## 2023-10-16 MED ORDER — GABAPENTIN 400 MG PO CAPS
400.0000 mg | ORAL_CAPSULE | Freq: Three times a day (TID) | ORAL | Status: DC
  Administered 2023-10-16 – 2023-10-19 (×9): 400 mg via ORAL
  Filled 2023-10-16 (×9): qty 1

## 2023-10-16 MED ORDER — THIAMINE HCL 100 MG/ML IJ SOLN
100.0000 mg | Freq: Once | INTRAMUSCULAR | Status: AC
  Administered 2023-10-16: 100 mg via INTRAMUSCULAR
  Filled 2023-10-16: qty 2

## 2023-10-16 MED ORDER — FUROSEMIDE 40 MG PO TABS
40.0000 mg | ORAL_TABLET | Freq: Once | ORAL | Status: AC
  Administered 2023-10-16: 40 mg via ORAL
  Filled 2023-10-16: qty 1

## 2023-10-16 MED ORDER — PAROXETINE HCL ER 12.5 MG PO TB24
12.5000 mg | ORAL_TABLET | Freq: Every day | ORAL | Status: DC
  Administered 2023-10-16 – 2023-10-17 (×2): 12.5 mg via ORAL
  Filled 2023-10-16 (×2): qty 1

## 2023-10-16 MED ORDER — NALTREXONE HCL 50 MG PO TABS
25.0000 mg | ORAL_TABLET | Freq: Every day | ORAL | Status: DC
  Administered 2023-10-16 – 2023-10-17 (×2): 25 mg via ORAL
  Filled 2023-10-16 (×2): qty 1

## 2023-10-16 MED ORDER — PRAZOSIN HCL 1 MG PO CAPS
1.0000 mg | ORAL_CAPSULE | Freq: Every day | ORAL | Status: DC
  Administered 2023-10-16 – 2023-10-18 (×3): 1 mg via ORAL
  Filled 2023-10-16 (×3): qty 1

## 2023-10-16 MED ORDER — DIPHENHYDRAMINE HCL 25 MG PO CAPS: 50.0000 mg | ORAL_CAPSULE | Freq: Three times a day (TID) | ORAL | Status: DC | PRN

## 2023-10-16 MED ORDER — LOPERAMIDE HCL 2 MG PO CAPS: 2.0000 mg | ORAL_CAPSULE | ORAL | Status: DC | PRN

## 2023-10-16 MED ORDER — BACLOFEN 10 MG PO TABS
10.0000 mg | ORAL_TABLET | Freq: Three times a day (TID) | ORAL | Status: DC
  Administered 2023-10-16 – 2023-10-19 (×9): 10 mg via ORAL
  Filled 2023-10-16 (×9): qty 1
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
## 2023-10-16 NOTE — Plan of Care (Signed)
  Problem: Education: Goal: Knowledge of [facility] Education information/materials will improve Outcome: Progressing Goal: Emotional status will improve Outcome: Progressing Goal: Mental status will improve Outcome: Progressing Goal: Verbalization of understanding the information provided will improve Outcome: Progressing   Problem: Activity: Goal: Interest or engagement in activities will improve Outcome: Progressing Goal: Sleeping patterns will improve Outcome: Progressing   Problem: Coping: Goal: Ability to verbalize frustrations and anger appropriately will improve Outcome: Progressing Goal: Ability to demonstrate self-control will improve Outcome: Progressing   Problem: Health Behavior/Discharge Planning: Goal: Identification of resources available to assist in meeting health care needs will improve Outcome: Progressing Goal: Compliance with treatment plan for underlying cause of condition will improve Outcome: Progressing   Problem: Physical Regulation: Goal: Ability to maintain clinical measurements within normal limits will improve Outcome: Progressing   Problem: Safety: Goal: Periods of time without injury will increase Outcome: Progressing   Problem: Education: Goal: Knowledge of disease or condition will improve Outcome: Progressing Goal: Understanding of discharge needs will improve Outcome: Progressing   Problem: Health Behavior/Discharge Planning: Goal: Ability to identify changes in lifestyle to reduce recurrence of condition will improve Outcome: Progressing Goal: Identification of resources available to assist in meeting health care needs will improve Outcome: Progressing   Problem: Physical Regulation: Goal: Complications related to the disease process, condition or treatment will be avoided or minimized Outcome: Progressing   Problem: Safety: Goal: Ability to remain free from injury will improve Outcome: Progressing   Problem:  Education: Goal: Utilization of techniques to improve thought processes will improve Outcome: Progressing Goal: Knowledge of the prescribed therapeutic regimen will improve Outcome: Progressing   Problem: Coping: Goal: Coping ability will improve Outcome: Progressing Goal: Will verbalize feelings Outcome: Progressing   Problem: Health Behavior/Discharge Planning: Goal: Ability to make decisions will improve Outcome: Progressing Goal: Compliance with therapeutic regimen will improve Outcome: Progressing

## 2023-10-16 NOTE — ED Notes (Signed)
 Evan Wilson Evan Wilson arrived to the [facility] for suicidal ideations and observation. Patient is A&Ox4. Patient reports having suicidal ideations. Patient denies any homicidal ideations. Pt contracts for safety. Patient admits to auditory hallucinations and visual hallucinations. Skin check conducted by Corean PEAK and Welsh, MHT. Patient oriented to the unit and provided food, beverage, and snack. Patient verbalized understanding. Patient currently resting in bed. No distress noted.

## 2023-10-16 NOTE — Tx Team (Signed)
 Initial Treatment Plan 10/16/2023 4:45 AM Evan Wilson FMW:[id number]    PATIENT STRESSORS: Substance abuse   Traumatic event     PATIENT STRENGTHS: Communication skills  Supportive family/friends    PATIENT IDENTIFIED PROBLEMS: Pt denied any                     DISCHARGE CRITERIA:  Improved stabilization in mood, thinking, and/or behavior Motivation to continue treatment in a less acute level of care Verbal commitment to aftercare and medication compliance Withdrawal symptoms are absent or subacute and managed without 24-hour nursing intervention  PRELIMINARY DISCHARGE PLAN: Attend aftercare/continuing care group Attend 12-step recovery group Outpatient therapy Return to previous living arrangement  PATIENT/FAMILY INVOLVEMENT: This treatment plan has been presented to and reviewed with the patient, Evan Wilson The patient have been given the opportunity to ask questions and make suggestions.  Revonda DELENA Land, RN 10/16/2023, 4:45 AM

## 2023-10-16 NOTE — Progress Notes (Signed)
(  Sleep Hours) -1.5 (Any PRNs that were needed, meds refused, or side effects to meds)- none (Any disturbances and when (visitation, over night)-none (Concerns raised by the patient)- none (SI/HI/AVH)-Denied

## 2023-10-16 NOTE — Progress Notes (Signed)
 Admission Note: patient is a VOL 48 year old male.Pt was calm and cooperative during assessment. Denies SI/HI/AVH. Pt stated earlier in day he endorsed SI. Pt stated his goal was to get his head right. Admission plan of care reviewed with pt, consent signed.  Personal belongings/skin assessment completed.  Pt has scratches on upper and lower extremities.  No contraband found.  Patient oriented to the unit, staff and room.  Routine safety checks initiated.  Verbalizes understanding of unit rules/protocols.   Patient is presently safe on the unit. No unsafe behaviors noted.  Q 15 minute safety checks maintained per unit protocol.

## 2023-10-16 NOTE — ED Notes (Signed)
 Patient currently sleeping and resting in recliner. RR even and unlabored, appearing in no noted distress. Environmental check complete

## 2023-10-16 NOTE — [facility] Suicide Risk Assessment (Signed)
 [facility] INPATIENT:  Family/Significant Other Suicide Prevention Education  Suicide Prevention Education:  Education Completed; Wife, Evan Wilson [phone],  (name of family member/significant other) has been identified by the patient as the family member/significant other with whom the patient will be residing, and identified as the person(s) who will aid the patient in the event of a mental health crisis (suicidal ideations/suicide attempt).  With written consent from the patient, the family member/significant other has been provided the following suicide prevention education, prior to the and/or following the discharge of the patient.  Received permission from pt to talk with wife. Wife reports pt has been drinking and hiding the alcohol , recently got out of [name] about a month ago. There are no firearms or weapons in the home after pt's prior suicide attempt in 2023. Pt does endorse hearing voices and seeing things on and off to wife.   Wife reports he is prescribed remeron , zoloft , zyprexa  and gabapentin  but does not know whether he takes the medications or not as prescribed. Reports when he is on medications and not drinking, pt does well however he continues to drink and have these types of episodes.   The suicide prevention education provided includes the following: Suicide risk factors Suicide prevention and interventions National Suicide Hotline telephone number [facility] assessment telephone number [facility] Emergency Assistance 911 [facility] and/or Residential Mobile Crisis Unit telephone number  Request made of family/significant other to: Remove weapons (e.g., guns, rifles, knives), all items previously/currently identified as safety concern.   Remove drugs/medications (over-the-counter, prescriptions, illicit drugs), all items previously/currently identified as a safety concern.  The family member/significant other verbalizes understanding of the  suicide prevention education information provided.  The family member/significant other agrees to remove the items of safety concern listed above.  Jenkins LULLA Primer 10/16/2023, 1:02 PM

## 2023-10-16 NOTE — Plan of Care (Signed)
   Problem: Education: Goal: Emotional status will improve Outcome: Progressing Goal: Mental status will improve Outcome: Progressing Goal: Verbalization of understanding the information provided will improve Outcome: Progressing

## 2023-10-16 NOTE — Progress Notes (Addendum)
 Pt has been accepted to [facility] on 10/16/2023 . Bed assignment:406-2  Pt meets inpatient criteria per Gaither Pouch, NP   Attending Physician will be Dr. Raliegh   Report can be called to: Adult unit: [phone]  Care Team Notified: [facility] Luke Sprang, RN, Corean Blackwater, RN
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
## 2023-10-16 NOTE — BH IP Treatment Plan (Signed)
 Interdisciplinary Treatment and Diagnostic Plan Update  10/16/2023 Time of Session: 10:50 AM Evan Wilson MRN: [id number]  Principal Diagnosis: Schizoaffective disorder, mixed type (HCC)  Secondary Diagnoses: Principal Problem:   Schizoaffective disorder, mixed type (HCC) Active Problems:   Obesity (BMI 30-39.9)   PTSD (post-traumatic stress disorder)   Nightmares associated with chronic post-traumatic stress disorder   Insomnia disorder   Severe alcohol  use disorder (HCC)   Pitting edema   Chronic pain   Unsuccessful suicide attempt, sequela (HCC)   Current Medications:  Current Facility-Administered Medications  Medication Dose Route Frequency Provider Last Rate Last Admin   baclofen  (LIORESAL ) tablet 10 mg  10 mg Oral TID Parker, Alvin S, MD   10 mg at 10/16/23 1655   cloNIDine  (CATAPRES ) tablet 0.1 mg  0.1 mg Oral Q6H PRN Parker, Alvin S, MD       haloperidol  (HALDOL ) tablet 5 mg  5 mg Oral TID PRN Trudy Carwin, NP       And   diphenhydrAMINE  (BENADRYL ) capsule 50 mg  50 mg Oral TID PRN Trudy Carwin, NP       haloperidol  lactate (HALDOL ) injection 5 mg  5 mg Intramuscular TID PRN Trudy Carwin, NP       And   diphenhydrAMINE  (BENADRYL ) injection 50 mg  50 mg Intramuscular TID PRN Trudy Carwin, NP       And   LORazepam  (ATIVAN ) injection 2 mg  2 mg Intramuscular TID PRN Trudy Carwin, NP       haloperidol  lactate (HALDOL ) injection 10 mg  10 mg Intramuscular TID PRN Trudy Carwin, NP       And   diphenhydrAMINE  (BENADRYL ) injection 50 mg  50 mg Intramuscular TID PRN Trudy Carwin, NP       And   LORazepam  (ATIVAN ) injection 2 mg  2 mg Intramuscular TID PRN Trudy Carwin, NP       gabapentin  (NEURONTIN ) capsule 400 mg  400 mg Oral TID Parker, Alvin S, MD   400 mg at 10/16/23 1655   LORazepam  (ATIVAN ) tablet 1 mg  1 mg Oral Q6H PRN [name], MD       naltrexone  (DEPADE) tablet 25 mg  25 mg Oral Daily Parker, Alvin S, MD   25 mg at 10/16/23 1206   PARoxetine   (PAXIL -CR) 24 hr tablet 12.5 mg  12.5 mg Oral Daily Parker, Alvin S, MD   12.5 mg at 10/16/23 1206   prazosin  (MINIPRESS ) capsule 1 mg  1 mg Oral QHS [name], MD       QUEtiapine  (SEROQUEL ) tablet 100 mg  100 mg Oral QHS Parker, Alvin S, MD       PTA Medications: Medications Prior to Admission  Medication Sig Dispense Refill Last Dose/Taking   baclofen  (LIORESAL ) 10 MG tablet Take 1 tablet (10 mg total) by mouth 3 (three) times daily. 90 tablet 2    gabapentin  (NEURONTIN ) 400 MG capsule Take 1 capsule (400 mg total) by mouth 3 (three) times daily. 90 capsule 1    mirtazapine  (REMERON ) 7.5 MG tablet Take 1 tablet (7.5 mg total) by mouth at bedtime. 30 tablet 0    OLANZapine  (ZYPREXA ) 10 MG tablet Take 1 tablet (10 mg total) by mouth at bedtime. 30 tablet 1    sertraline  (ZOLOFT ) 100 MG tablet Take 1.5 tablets (150 mg total) by mouth daily. 45 tablet 1     Patient Stressors: Substance abuse   Traumatic event    Patient Strengths: Manufacturing systems engineer  Supportive family/friends   Treatment Modalities: Medication Management, Group therapy, Case management,  1 to 1 session with clinician, Psychoeducation, Recreational therapy.   Physician Treatment Plan for Primary Diagnosis: Schizoaffective disorder, mixed type (HCC) Long Term Goal(s):     Short Term Goals:    Medication Management: Evaluate patient's response, side effects, and tolerance of medication regimen.  Therapeutic Interventions: 1 to 1 sessions, Unit Group sessions and Medication administration.  Evaluation of Outcomes: Not Progressing  Physician Treatment Plan for Secondary Diagnosis: Principal Problem:   Schizoaffective disorder, mixed type (HCC) Active Problems:   Obesity (BMI 30-39.9)   PTSD (post-traumatic stress disorder)   Nightmares associated with chronic post-traumatic stress disorder   Insomnia disorder   Severe alcohol  use disorder (HCC)   Pitting edema   Chronic pain   Unsuccessful suicide  attempt, sequela (HCC)  Long Term Goal(s):     Short Term Goals:       Medication Management: Evaluate patient's response, side effects, and tolerance of medication regimen.  Therapeutic Interventions: 1 to 1 sessions, Unit Group sessions and Medication administration.  Evaluation of Outcomes: Not Progressing   RN Treatment Plan for Primary Diagnosis: Schizoaffective disorder, mixed type (HCC) Long Term Goal(s): Knowledge of disease and therapeutic regimen to maintain health will improve  Short Term Goals: Ability to remain free from injury will improve, Ability to verbalize frustration and anger appropriately will improve, Ability to demonstrate self-control, Ability to participate in decision making will improve, Ability to verbalize feelings will improve, Ability to disclose and discuss suicidal ideas, Ability to identify and develop effective coping behaviors will improve, and Compliance with prescribed medications will improve  Medication Management: RN will administer medications as ordered by provider, will assess and evaluate patient's response and provide education to patient for prescribed medication. RN will report any adverse and/or side effects to prescribing provider.  Therapeutic Interventions: 1 on 1 counseling sessions, Psychoeducation, Medication administration, Evaluate responses to treatment, Monitor vital signs and CBGs as ordered, Perform/monitor CIWA, COWS, AIMS and Fall Risk screenings as ordered, Perform wound care treatments as ordered.  Evaluation of Outcomes: Not Progressing   LCSW Treatment Plan for Primary Diagnosis: Schizoaffective disorder, mixed type (HCC) Long Term Goal(s): Safe transition to appropriate next level of care at discharge, Engage patient in therapeutic group addressing interpersonal concerns.  Short Term Goals: Engage patient in aftercare planning with referrals and resources, Increase social support, Increase ability to appropriately  verbalize feelings, Increase emotional regulation, Facilitate acceptance of mental health diagnosis and concerns, Facilitate patient progression through stages of change regarding substance use diagnoses and concerns, Identify triggers associated with mental health/substance abuse issues, and Increase skills for wellness and recovery  Therapeutic Interventions: Assess for all discharge needs, 1 to 1 time with Social worker, Explore available resources and support systems, Assess for adequacy in community support network, Educate family and significant other(s) on suicide prevention, Complete Psychosocial Assessment, Interpersonal group therapy.  Evaluation of Outcomes: Not Progressing   Progress in Treatment: Attending groups: attended some groups Participating in groups: Yes. Taking medication as prescribed: Yes. Toleration medication: Yes. Family/Significant other contact made: Yes, individual(s) contacted:  Wife, Harlene Barge [phone] Patient understands diagnosis: Yes. Discussing patient identified problems/goals with staff: Yes. Medical problems stabilized or resolved: Yes. Denies suicidal/homicidal ideation: Yes. Issues/concerns per patient self-inventory: No.  New problem(s) identified:  No  New Short Term/Long Term Goal(s):    medication stabilization, elimination of SI thoughts, development of comprehensive mental wellness plan.    Patient Goals:  I  want to get my medication adjusted.  Discharge Plan or Barriers:  Patient recently admitted. CSW will continue to follow and assess for appropriate referrals and possible discharge planning.     Reason for Continuation of Hospitalization: Hallucinations Medication stabilization Suicidal ideation  Estimated Length of Stay:  5 - 7 days  Last 3 [place] Suicide Severity Risk Score: Flowsheet Row [facility]15/2023 in [facility] Admission (Discharged) from 08/21/2023 [facility]ICINE  C-SSRS RISK CATEGORY High Risk High Risk Error: Q3, 4, or 5 should not be populated when Q2 is No    Last PHQ 2/9 Scores:    05/13/2023   10:05 AM 03/06/2023   11:32 AM 03/02/2023    2:34 PM  Depression screen PHQ 2/9  Decreased Interest 2 1 2   Down, Depressed, Hopeless 0 1 3  PHQ - 2 Score 2 2 5   Altered sleeping 0 1 3  Tired, decreased energy 2 0 1  Change in appetite 3 1 1   Feeling bad or failure about yourself  1 1 3   Trouble concentrating 3 1 2   Moving slowly or fidgety/restless 2 2   Suicidal thoughts 0 1 1  PHQ-9 Score 13 9 16   Difficult doing work/chores Not difficult at all Somewhat difficult     Scribe for Treatment Team: Javiel Canepa O Tenleigh Byer, LCSWA 10/16/2023 7:04 PM

## 2023-10-16 NOTE — Group Note (Signed)
 Date:  10/16/2023 Time:  4:10 PM  Group Topic/Focus: Sleep/ Wellness Education Wellness Toolbox:   The focus of this group is to discuss various aspects of wellness, balancing those aspects and exploring ways to increase the ability to experience wellness.  Patients will create a wellness toolbox for use upon discharge.    Participation Level:  Active  Participation Quality:  Appropriate  Affect:  Appropriate  Cognitive:  Alert and Appropriate  Insight: Appropriate  Engagement in Group:  Engaged  Modes of Intervention:  Discussion  Additional Comments:  Patient was engaged appropriately during the group.  Prinston Kynard D Virginia Francisco 10/16/2023, 4:10 PM
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
## 2023-10-16 NOTE — Group Note (Signed)
 Date:  10/16/2023 Time:  9:23 AM  Group Topic/Focus:  Goals Group:   The focus of this group is to help patients establish daily goals to achieve during treatment and discuss how the patient can incorporate goal setting into their daily lives to aide in recovery. Orientation:   The focus of this group is to educate the patient on the purpose and policies of crisis stabilization and provide a format to answer questions about their admission.  The group details unit policies and expectations of patients while admitted.    Participation Level:  Did Not Attend   Evan Wilson Mars 10/16/2023, 9:23 AM

## 2023-10-17 DIAGNOSIS — F250 Schizoaffective disorder, bipolar type: Principal | ICD-10-CM

## 2023-10-17 LAB — RPR: RPR Ser Ql: NONREACTIVE

## 2023-10-17 MED ORDER — PAROXETINE HCL ER 12.5 MG PO TB24
25.0000 mg | ORAL_TABLET | Freq: Every day | ORAL | Status: DC
  Administered 2023-10-18 – 2023-10-19 (×2): 25 mg via ORAL
  Filled 2023-10-17 (×2): qty 2

## 2023-10-17 MED ORDER — FUROSEMIDE 40 MG PO TABS
40.0000 mg | ORAL_TABLET | Freq: Once | ORAL | Status: AC
  Administered 2023-10-17: 40 mg via ORAL
  Filled 2023-10-17: qty 1

## 2023-10-17 MED ORDER — NALTREXONE HCL 50 MG PO TABS
50.0000 mg | ORAL_TABLET | Freq: Every day | ORAL | Status: DC
  Administered 2023-10-19: 50 mg via ORAL
  Filled 2023-10-17: qty 1

## 2023-10-17 MED ORDER — HYDROCHLOROTHIAZIDE 12.5 MG PO TABS
12.5000 mg | ORAL_TABLET | Freq: Every day | ORAL | Status: DC
  Administered 2023-10-17 – 2023-10-19 (×3): 12.5 mg via ORAL
  Filled 2023-10-17 (×3): qty 1

## 2023-10-17 NOTE — Group Note (Signed)
 LCSW Group Therapy Note   Group Date: 10/17/2023 Start Time: 1100 End Time: 1200  Participation: Attended. Patient was actively listening and engaged in discussion. Patient provided feedback and respectful towards group members and facilitator.   Type of Therapy:  Group Therapy  Topic: Stronger Together: Building Healthy Relationships  Objective: To explore loneliness, boundaries, and safe ways to build relationships.   Goals:  1. Recognize healthy vs. unhealthy relationships.  2. Learn safe ways to connect with others.  3. Strengthen communication and boundary-setting skills.   Summary: Participants discussed loneliness, healthy connections, and setting boundaries. They explored safe ways to meet people and shared personal experiences. Key insights were reinforced through discussion and quotes.   Therapeutic Modalities Used:   Cognitive Behavioral Therapy (CBT) Elements - Identifying unhealthy relationship patterns, challenging negative thoughts about connection.   Dialectical Behavior Therapy (DBT) Elements - Interpersonal effectiveness, setting and maintaining boundaries.   Supportive Group Therapy - Peer discussion, shared experiences, and emotional validation.  Louetta Lame, LCSWA 10/17/2023  1:21 PM

## 2023-10-17 NOTE — Plan of Care (Signed)
  Problem: Education: Goal: Knowledge of [facility] Education information/materials will improve Outcome: Progressing Goal: Emotional status will improve Outcome: Progressing Goal: Mental status will improve Outcome: Progressing Goal: Verbalization of understanding the information provided will improve Outcome: Progressing   Problem: Activity: Goal: Interest or engagement in activities will improve Outcome: Progressing Goal: Sleeping patterns will improve Outcome: Progressing   Problem: Coping: Goal: Ability to verbalize frustrations and anger appropriately will improve Outcome: Progressing Goal: Ability to demonstrate self-control will improve Outcome: Progressing   Problem: Health Behavior/Discharge Planning: Goal: Identification of resources available to assist in meeting health care needs will improve Outcome: Progressing Goal: Compliance with treatment plan for underlying cause of condition will improve Outcome: Progressing   Problem: Physical Regulation: Goal: Ability to maintain clinical measurements within normal limits will improve Outcome: Progressing   Problem: Safety: Goal: Periods of time without injury will increase Outcome: Progressing   Problem: Education: Goal: Knowledge of disease or condition will improve Outcome: Progressing Goal: Understanding of discharge needs will improve Outcome: Progressing   Problem: Health Behavior/Discharge Planning: Goal: Ability to identify changes in lifestyle to reduce recurrence of condition will improve Outcome: Progressing Goal: Identification of resources available to assist in meeting health care needs will improve Outcome: Progressing   Problem: Physical Regulation: Goal: Complications related to the disease process, condition or treatment will be avoided or minimized Outcome: Progressing   Problem: Safety: Goal: Ability to remain free from injury will improve Outcome: Progressing   Problem:  Education: Goal: Utilization of techniques to improve thought processes will improve Outcome: Progressing Goal: Knowledge of the prescribed therapeutic regimen will improve Outcome: Progressing   Problem: Coping: Goal: Coping ability will improve Outcome: Progressing Goal: Will verbalize feelings Outcome: Progressing   Problem: Health Behavior/Discharge Planning: Goal: Ability to make decisions will improve Outcome: Progressing Goal: Compliance with therapeutic regimen will improve Outcome: Progressing

## 2023-10-17 NOTE — Group Note (Signed)
 Date:  10/17/2023 Time:  4:07 AM  Group Topic/Focus:  Wrap-Up Group:   The focus of this group is to help patients review their daily goal of treatment and discuss progress on daily workbooks.    Participation Level:  Active  Participation Quality:  Appropriate  Affect:  Appropriate  Cognitive:  Appropriate  Insight: Appropriate  Engagement in Group:  Engaged  Modes of Intervention:  Discussion, Socialization, and Support  Additional Comments:  Patient attended NA  Eward Mace 10/17/2023, 4:07 AM

## 2023-10-17 NOTE — Progress Notes (Signed)
   10/16/23 2300  Psych Admission Type (Psych Patients Only)  Admission Status Voluntary  Psychosocial Assessment  Patient Complaints Depression  Eye Contact Fair  Facial Expression Flat  Affect Appropriate to circumstance  Speech Logical/coherent  Interaction Minimal  Motor Activity Slow  Appearance/Hygiene Unremarkable  Behavior Characteristics Cooperative;Appropriate to situation  Mood Pleasant  Aggressive Behavior  Effect No apparent injury  Thought Process  Coherency WDL  Content WDL  Delusions WDL  Perception WDL  Hallucination None reported or observed  Judgment WDL  Confusion None  Danger to Self  Current suicidal ideation? Denies

## 2023-10-17 NOTE — Progress Notes (Signed)
   10/17/23 2020  Psych Admission Type (Psych Patients Only)  Admission Status Voluntary  Psychosocial Assessment  Patient Complaints Sleep disturbance  Eye Contact Fair  Facial Expression Flat  Affect Appropriate to circumstance  Speech Soft  Interaction Minimal  Motor Activity Other (Comment) (WNL)  Appearance/Hygiene In scrubs  Behavior Characteristics Appropriate to situation;Calm  Mood Pleasant  Thought Process  Coherency WDL  Content WDL  Delusions None reported or observed  Perception WDL  Hallucination None reported or observed  Judgment Poor  Confusion None  Danger to Self  Current suicidal ideation?  (Denies)  Agreement Not to Harm Self Yes  Description of Agreement Notify Staff  Danger to Others  Danger to Others None reported or observed

## 2023-10-17 NOTE — Progress Notes (Signed)
   10/17/23 0740  Psych Admission Type (Psych Patients Only)  Admission Status Voluntary  Psychosocial Assessment  Patient Complaints Depression  Eye Contact Fair  Facial Expression Flat  Affect Appropriate to circumstance  Speech Logical/coherent  Interaction Minimal  Motor Activity Slow  Appearance/Hygiene Unremarkable  Behavior Characteristics Cooperative;Appropriate to situation  Mood Pleasant  Thought Process  Coherency WDL  Content WDL  Delusions None reported or observed  Perception WDL  Hallucination None reported or observed  Judgment Poor  Confusion None  Danger to Self  Current suicidal ideation? Denies  Agreement Not to Harm Self Yes  Description of Agreement verbal  Danger to Others  Danger to Others None reported or observed

## 2023-10-17 NOTE — Progress Notes (Signed)
(  Sleep Hours) -8.75 (Any PRNs that were needed, meds refused, or side effects to meds)- none (Any disturbances and when (visitation, over night)-none (Concerns raised by the patient)- none (SI/HI/AVH)-Denied

## 2023-10-17 NOTE — Plan of Care (Signed)
  Problem: Education: Goal: Knowledge of [facility] Education information/materials will improve Outcome: Progressing Goal: Verbalization of understanding the information provided will improve Outcome: Progressing   Problem: Coping: Goal: Ability to verbalize frustrations and anger appropriately will improve Outcome: Progressing   Problem: Physical Regulation: Goal: Ability to maintain clinical measurements within normal limits will improve Outcome: Progressing   Problem: Safety: Goal: Periods of time without injury will increase Outcome: Progressing

## 2023-10-17 NOTE — BHH Group Notes (Signed)
 Spirituality Group   Description: Participant directed exploration of values, beliefs and meaning (Theme of shame/guilt/resilience through self-compassion)  Following a brief framework of chaplain's role and ground rules of group behavior, participants are invited to share concerns or questions that engage spiritual life. Emphasis placed on common themes and shared experiences and ways to make meaning and clarify living into one's values.   Theory/Process/Goal: Utilize the theoretical framework of group therapy established by Celena Kite, Relational Cultural Theory and Rogerian approaches to facilitate relational empathy and use of the "here and now" to foster reflection, self-awareness, and sharing.   Observations: Evan Wilson was an active participant in the group discussion. He was thoughtful and appropriate.  Evan Wilson L. Fredrica, M.Div [phone]

## 2023-10-17 NOTE — Group Note (Signed)
 Date:  10/17/2023 Time:  9:04 PM  Group Topic/Focus:  Wrap-Up Group:   The focus of this group is to help patients review their daily goal of treatment and discuss progress on daily workbooks.    Participation Level:  Active  Participation Quality:  Appropriate and Sharing  Affect:  Appropriate  Cognitive:  Appropriate  Insight: Appropriate  Engagement in Group:  Engaged  Modes of Intervention:  Activity and Socialization  Additional Comments:  Patient shared that he had a good day. Patient rated his day a 7 out of 10. Patient shared that he interacted with others throughout the day and was proactive. Patient shared his day for today was to getting medication in order and patient shared that he did speak with doctor. Patient shared his highlight of the day, being outside.   Eward Mace 10/17/2023, 9:04 PM

## 2023-10-18 ENCOUNTER — Ambulatory Visit (HOSPITAL_COMMUNITY): Payer: MEDICAID

## 2023-10-18 MED ORDER — QUETIAPINE FUMARATE 200 MG PO TABS
200.0000 mg | ORAL_TABLET | Freq: Every day | ORAL | Status: DC
  Administered 2023-10-18: 200 mg via ORAL
  Filled 2023-10-18: qty 1

## 2023-10-18 NOTE — Group Note (Signed)
 Date:  10/18/2023 Time:  5:02 PM  Group Topic/Focus:  Dimensions of Wellness:   The focus of this group is to introduce the topic of wellness and discuss the role each dimension of wellness plays in total health.Patients discussed triggers that can cause setbacks in recovery and discussed deep breathing as a coping skill. Wellness Toolbox:   The focus of this group is to discuss various aspects of wellness, balancing those aspects and exploring ways to increase the ability to experience wellness.  Patients will create a wellness toolbox for use upon discharge.     Participation Level:  Active  Participation Quality:  Appropriate  Affect:  Appropriate  Cognitive:  Alert, Appropriate, and Oriented  Insight: Improving and Lacking  Engagement in Group:  Engaged, Improving, and Lacking  Modes of Intervention:  Discussion, Education, Problem-solving, and Socialization  Additional Comments:  Curren attended in group with limited verbal participation. He did appear engaged in the conversation.  Kristi HERO Emma Schupp 10/18/2023, 5:02 PM

## 2023-10-18 NOTE — Group Note (Signed)
 Date:  10/18/2023 Time:  9:27 AM  Group Topic/Focus: Positivity  The goal of this group was for each person to write their name in the middle of a piece of paper and pass it around the dayroom to their peers. Each person was to write a positive comment anonymously about each other. Each patient was allowed to share what was said and reflect on their goals for today.   Participation Level:  Did Not Attend  Participation Quality:  N/A  Affect:  N/A  Cognitive:  N/A  Insight: None  Engagement in Group:  N/A  Modes of Intervention:  N/A  Additional Comments:  N/A  Kristi CHRISTELLA Plaza 10/18/2023, 9:27 AM

## 2023-10-18 NOTE — Plan of Care (Signed)
  Problem: Education: Goal: Emotional status will improve Outcome: Progressing   Problem: Activity: Goal: Interest or engagement in activities will improve Outcome: Progressing   Problem: Health Behavior/Discharge Planning: Goal: Compliance with treatment plan for underlying cause of condition will improve Outcome: Progressing
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
## 2023-10-18 NOTE — BH Assessment (Signed)
 (  Sleep Hours) - 7.5 (Any PRNs that were needed, meds refused, or side effects to meds)- None (Any disturbances and when (visitation, over night)- None (Concerns raised by the patient)- None (SI/HI/AVH)- Denies

## 2023-10-18 NOTE — Group Note (Signed)
 Date:  10/18/2023 Time:  10:55 AM  Group Topic/Focus:  Goals Group:   The focus of this group is to help patients establish daily goals to achieve during treatment and discuss how the patient can incorporate goal setting into their daily lives to aide in recovery.    Participation Level:  Did Not Attend  Participation Quality:  N/A  Affect:  N/A  Cognitive:  N/A  Insight: None  Engagement in Group:  N/A  Modes of Intervention:  N/A  Additional Comments:  N/A  Kristi CHRISTELLA Plaza 10/18/2023, 10:55 AM

## 2023-10-18 NOTE — Progress Notes (Signed)
   10/18/23 0800  Psych Admission Type (Psych Patients Only)  Admission Status Voluntary  Psychosocial Assessment  Patient Complaints Sleep disturbance  Eye Contact Fair  Facial Expression Flat  Affect Appropriate to circumstance  Speech Logical/coherent  Interaction Minimal  Motor Activity Other (Comment) (WNL for pt)  Appearance/Hygiene In scrubs  Behavior Characteristics Appropriate to situation  Mood Pleasant  Thought Process  Coherency WDL  Content WDL  Delusions None reported or observed  Perception WDL  Hallucination None reported or observed  Judgment Poor  Confusion None  Danger to Self  Current suicidal ideation? Denies  Agreement Not to Harm Self Yes  Description of Agreement verbal  Danger to Others  Danger to Others None reported or observed

## 2023-10-18 NOTE — Plan of Care (Signed)
   Problem: Education: Goal: Knowledge of [facility] Education information/materials will improve Outcome: Progressing Goal: Emotional status will improve Outcome: Progressing Goal: Mental status will improve Outcome: Progressing Goal: Verbalization of understanding the information provided will improve Outcome: Progressing

## 2023-10-18 NOTE — Group Note (Signed)
 Date:  10/18/2023 Time:  4:12 PM  Group Topic/Focus:  Exploring Lonliness    Participation Level:  Active  Participation Quality:  Appropriate  Affect:  Appropriate  Cognitive:  Alert  Insight: Appropriate  Engagement in Group:  Engaged and Supportive  Modes of Intervention:  Discussion and education  Additional Comments:    Evan Wilson M [name] 10/18/2023, 4:12 PM

## 2023-10-18 NOTE — [facility] Group Notes (Signed)
 [facility] Group Notes:  (Nursing/MHT/Case Management/Adjunct)  Date:  10/18/2023  Time:  2000  Type of Therapy:  Alcoholics Anonymous Meeting  Participation Level:  Active  Participation Quality:  Appropriate, Attentive, and Supportive  Affect:  Depressed  Cognitive:  Alert  Insight:  Improving  Engagement in Group:  Engaged  Modes of Intervention:  Clarification, Education, and Support  Summary of Progress/Problems:  Evan Wilson 10/18/2023, 10:04 PM
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
## 2023-10-19 DIAGNOSIS — T1491XS Suicide attempt, sequela: Secondary | ICD-10-CM

## 2023-10-19 DIAGNOSIS — R609 Edema, unspecified: Secondary | ICD-10-CM

## 2023-10-19 DIAGNOSIS — G4700 Insomnia, unspecified: Secondary | ICD-10-CM

## 2023-10-19 DIAGNOSIS — F515 Nightmare disorder: Secondary | ICD-10-CM

## 2023-10-19 DIAGNOSIS — Z6830 Body mass index (BMI) 30.0-30.9, adult: Secondary | ICD-10-CM

## 2023-10-19 DIAGNOSIS — F1020 Alcohol dependence, uncomplicated: Secondary | ICD-10-CM

## 2023-10-19 DIAGNOSIS — F4312 Post-traumatic stress disorder, chronic: Secondary | ICD-10-CM

## 2023-10-19 DIAGNOSIS — G8929 Other chronic pain: Secondary | ICD-10-CM

## 2023-10-19 DIAGNOSIS — E669 Obesity, unspecified: Secondary | ICD-10-CM

## 2023-10-19 MED ORDER — HYDROCHLOROTHIAZIDE 12.5 MG PO TABS: 12.5000 mg | ORAL_TABLET | Freq: Every day | ORAL | 0 refills | Status: DC

## 2023-10-19 MED ORDER — NALTREXONE HCL 50 MG PO TABS: 50.0000 mg | ORAL_TABLET | Freq: Every day | ORAL | 0 refills | Status: DC

## 2023-10-19 MED ORDER — PAROXETINE HCL ER 25 MG PO TB24: 25.0000 mg | ORAL_TABLET | Freq: Every day | ORAL | 0 refills | Status: DC

## 2023-10-19 MED ORDER — PRAZOSIN HCL 1 MG PO CAPS: 1.0000 mg | ORAL_CAPSULE | Freq: Every day | ORAL | 0 refills | Status: DC

## 2023-10-19 MED ORDER — QUETIAPINE FUMARATE 200 MG PO TABS: 200.0000 mg | ORAL_TABLET | Freq: Every day | ORAL | 0 refills | Status: DC
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
## 2023-10-19 NOTE — BHH Suicide Risk Assessment (Signed)
 [facility] Discharge Suicide Risk Assessment   Principal Problem: Schizoaffective disorder, mixed type [facility]) Discharge Diagnoses: Principal Problem:   Schizoaffective disorder, mixed type (HCC) Active Problems:   Obesity (BMI 30-39.9)   PTSD (post-traumatic stress disorder)   Nightmares associated with chronic post-traumatic stress disorder   Insomnia disorder   Severe alcohol  use disorder (HCC)   Pitting edema   Chronic pain   Unsuccessful suicide attempt, sequela (HCC)   Suicide Risk Assessment: The patient presented with acute risk factors for suicide including increased depressive symptoms, break-through psychotic symptoms (voices) and passive SI. Additional risk factors include white male, prior suicide attempt (in 2023), prior psychiatric admissions. To address acute risk factors the patient was admitted to [facility] with medications restarted. SI quickly resolved and he has been denying suicidal thoughts for the past 3 days. Similarly, psychotic symptoms were mild with no overt disorganization in thoughts or behavior. AH has been chronic and tolerable. Today the patient presented as euthymic in affect, reporting benefits from current medications and future orientation. He was looking forward to leaving the hospital and reconnecting with wife. Additional protective factors include being domiciled, being married, care for children, stable housing. Most appropriate and least restrictive environment is outpatient follow up. He is discharged with paper scipts and follow up scheduled as noted below    Total Time spent with patient: 35 minutes   Follow-up Information     [facility]. Go on 10/21/2023.   Specialty: Behavioral Health Why: You have an appointment on 10/21/23 at 8:30 am for substance abuse intensive outpatient therapy and medication management services.  For questions, please call [phone]. Contact information: [address] [place]   [phone]        [facility] [place] Follow up on 10/24/2023.   Specialty: Behavioral Health Why: You have an appointment for medication management services on 10/24/23 at 9:30 am, Virtual.   * Please call to reschedule if you cannot attend this appointment. Contact information: [address] [place]  [address] [phone]        [place] Follow up.   Specialty: Professional Counselor Why: You may also go to this provider for therapy services.  Go on Monday through Friday, from 9 am to 1 pm for an initial assessment. Contact information: [address] [place]  [address] [place] [address] [phone]                 Leita LOISE Arts, MD 10/19/2023, 8:42 AM

## 2023-10-19 NOTE — Progress Notes (Signed)
 Pt discharged to lobby. Pt was stable and appreciative at that time. All papers and prescriptions were given and valuables returned. Verbal understanding expressed. Denies SI/HI and A/VH. Pt given opportunity to express concerns and ask questions.

## 2023-10-19 NOTE — BH Assessment (Signed)
(  Sleep Hours) - 8 (Any PRNs that were needed, meds refused, or side effects to meds)- None (Any disturbances and when (visitation, over night)- None (Concerns raised by the patient)- None (SI/HI/AVH)- Denies

## 2023-10-19 NOTE — Progress Notes (Signed)
   10/18/23 2015  Psych Admission Type (Psych Patients Only)  Admission Status Voluntary  Psychosocial Assessment  Patient Complaints Worrying  Eye Contact Fair  Facial Expression Flat  Affect Appropriate to circumstance  Speech Soft  Interaction Isolative  Motor Activity Other (Comment) (WNL)  Appearance/Hygiene In scrubs  Behavior Characteristics Appropriate to situation  Thought Process  Coherency WDL  Content WDL  Delusions None reported or observed  Perception WDL  Hallucination None reported or observed  Judgment Poor  Confusion None  Danger to Self  Current suicidal ideation?  (Denies)  Agreement Not to Harm Self Yes  Description of Agreement Notify Staff  Danger to Others  Danger to Others None reported or observed

## 2023-10-19 NOTE — Progress Notes (Signed)
  [facility] Adult Case Management Discharge Plan :  Will you be returning to the same living situation after discharge:  Yes,  the patient will be going home At discharge, do you have transportation home?: Yes,  the [employer] taxi will take him home Do you have the ability to pay for your medications: Yes,  the patient has insurance  Release of information consent forms completed and in the chart;  Patient's signature needed at discharge.  Patient to Follow up at:  Follow-up Information     [facility]. Go on 10/21/2023.   Specialty: Behavioral Health Why: You have an appointment on 10/21/23 at 8:30 am for substance abuse intensive outpatient therapy and medication management services.  For questions, please call [phone]. Contact information: [address] [place]  [phone]        [facility] [place] Follow up on 10/24/2023.   Specialty: Behavioral Health Why: You have an appointment for medication management services on 10/24/23 at 9:30 am, Virtual.   * Please call to reschedule if you cannot attend this appointment. Contact information: [address] [place]  [address] [phone]        [place] Follow up.   Specialty: Professional Counselor Why: You may also go to this provider for therapy services.  Go on Monday through Friday, from 9 am to 1 pm for an initial assessment. Contact information: [address] [place]  [address] [place] [address] [phone]                 Next level of care provider has access to [facility] Link:no  Safety Planning and Suicide Prevention discussed: Yes,  csw called and spoke to wife     Has patient been referred to the Quitline?: Patient refused referral for treatment  Patient has been referred for addiction treatment: Patient refused referral for treatment.  Nazaria Riesen O  Johnie Makki, LCSWA 10/19/2023, 9:49 AM

## 2023-10-19 NOTE — Progress Notes (Signed)
   10/19/23 0900  Psych Admission Type (Psych Patients Only)  Admission Status Voluntary  Psychosocial Assessment  Patient Complaints Worrying  Eye Contact Fair  Facial Expression Flat  Affect Appropriate to circumstance  Speech Soft  Interaction Isolative  Motor Activity Slow  Appearance/Hygiene Unremarkable  Behavior Characteristics Appropriate to situation  Mood Pleasant  Thought Process  Coherency WDL  Content WDL  Delusions None reported or observed  Perception WDL  Hallucination None reported or observed  Judgment Poor  Confusion None  Danger to Self  Current suicidal ideation? Denies  Description of Suicide Plan No Plan  Agreement Not to Harm Self Yes  Description of Agreement Verbal  Danger to Others  Danger to Others None reported or observed

## 2023-10-19 NOTE — Plan of Care (Signed)
   Problem: Education: Goal: Knowledge of [facility] Education information/materials will improve Outcome: Progressing Goal: Emotional status will improve Outcome: Progressing Goal: Mental status will improve Outcome: Progressing Goal: Verbalization of understanding the information provided will improve Outcome: Progressing

## 2023-10-21 ENCOUNTER — Ambulatory Visit (HOSPITAL_COMMUNITY): Payer: MEDICAID

## 2023-10-21 ENCOUNTER — Telehealth (HOSPITAL_COMMUNITY): Payer: Self-pay

## 2023-10-21 ENCOUNTER — Telehealth (HOSPITAL_COMMUNITY): Payer: Self-pay | Admitting: Licensed Clinical Social Worker

## 2023-10-21 NOTE — Telephone Encounter (Signed)
 As Evan Wilson no shows for his aftercare appointment today, the therapist attempts to reach him leaving a HIPAA-compliant voicemail with the woman who answers his contact number who indicates that he is not home.  Evan Maier, MA, LCSW, [place], LCAS 10/21/2023

## 2023-10-21 NOTE — Telephone Encounter (Signed)
 Therapist contacts Evan Wilson via telephone call and confirms his identity by obtaining two verifiers.  As Evan Wilson missed his appointment this a.m., therapist inquires if he is still interested in IOP. He said he had to go to his P.O. officer this morning and noted he is still interested in group. He explains he was in [facility] from last Tuesday to Saturday. He says his medications were not working and they were able to change his meds which seem to be working. Therapist asks if he can come in at 8:30 am on this Wednesday to update his paperwork. He says he is and confirms he can be here at 8:30 am.  Darice Simpler, MS, LMFT, LCAS

## 2023-10-23 ENCOUNTER — Encounter (HOSPITAL_COMMUNITY): Payer: Self-pay

## 2023-10-23 ENCOUNTER — Ambulatory Visit (INDEPENDENT_AMBULATORY_CARE_PROVIDER_SITE_OTHER): Payer: MEDICAID

## 2023-10-23 ENCOUNTER — Ambulatory Visit (HOSPITAL_COMMUNITY): Payer: MEDICAID

## 2023-10-23 DIAGNOSIS — F411 Generalized anxiety disorder: Secondary | ICD-10-CM

## 2023-10-23 DIAGNOSIS — F250 Schizoaffective disorder, bipolar type: Secondary | ICD-10-CM

## 2023-10-23 DIAGNOSIS — F4310 Post-traumatic stress disorder, unspecified: Secondary | ICD-10-CM

## 2023-10-23 DIAGNOSIS — F1020 Alcohol dependence, uncomplicated: Secondary | ICD-10-CM

## 2023-10-23 NOTE — Progress Notes (Addendum)
 Individual Therapy   Time:  8:45 to 9:35 am  Visit Diagnoses:  Schizoaffective Disorder, Mixed, Alcohol  Use Disorder, Severe, PTSD and Generalized Anxiety Disorder.   Therapist Interventions: Active listening, ongoing assessment, Motivational Interviewing    Treatment Goals: Template: Substance Use Disorder Substance Use Treatment Plan 09/27/23 Effective from: 09/27/2023 Effective to: 03/29/2024 Plan ID: [id number] Problem: Substance Use Dates: Start: 09/27/23 Disciplines: Interdisciplinary, PROVIDER Goal: Evan Wilson will report complete abstinence from drugs and alcohol  per self report and weekly UDS while also attending 12 step meetings, obtaining a sponsor and beginning step work per self report Dates: Start: 09/27/23 Expected End: 03/29/24 Disciplines: Interdisciplinary, PROVIDER Goal: Evan Wilson will report a decrease in his depression and anxiety AEB reporting no higher than a 4 on the PHQ-9 and the GAD-7 Dates: Start: 09/27/23 Expected End: 03/29/24 Disciplines: Interdisciplinary, PROVIDER Intervention: Therapist will educate Evan Wilson about SUDS, patterns and consequence of use, relapse risks, the treatment process, types of mutual support groups and provide early recovery and relapse prevention skills Dates: Start: 09/27/23 Intervention: Therapist will assist Evan Wilson in identifying thoughts and behaviors taht can contribute to feelings of depression and anxiety. Dates: Start: 09/27/23 Description: Evan Wilson gives this therapist verbal permission to electronically sign his care plan Outcomes Date/Time User Outcome 09/27/23 1039 Evan SAUNDERS Initial Outcomes Date/Time User Outcome 09/27/23 1039 Evan SAUNDERS Initial Page 1 of  Summary:  Evan Wilson presents today to be evaluated for level of services he needs.  Therapist inquires as to why he dropped out and didn't contact us , when he was in [facility] before.  He says he was busy getting his kids' school supplies and he had  to make car repairs. He further says he did not have the phone number. Therapist inquires as to why he did not call the general number and ask to be transferred. Evan Wilson provided no answer.  Evan Wilson was given the dx of Schizoaffective Disorder, Mixed while he hospitalized at [facility]. This is a change from his previous dx of Major Depressive Disorder with psychotic features. Evan Wilson reports the [place] is helping calm the voices. Evan Wilson reports onset of voices was approximately 3 years ago.  He is not able to identify any psychosocial stressors at that time.    Therapist discusses Evan Wilson's level of motivation and interest in [facility].  Therapist also explains another option is individual.  Evan Wilson opts for individual noting that he is busy with his kids going back to school and really does not have time to attend [facility].   Evan Wilson scores a 15 on his PHQ-9 and a 11 on his GAD-7.   Medications:  baclofen  10 MG tablet Commonly known as: LIORESAL  Take 1 tablet (10 mg total) by mouth 3 (three) times daily.   Indication: Abuse or Misuse of Alcohol     gabapentin  400 MG capsule Commonly known as: NEURONTIN  Take 1 capsule (400 mg total) by mouth 3 (three) times daily.   Indication: Abuse or Misuse of Alcohol , Generalized Anxiety Disorder, Neuropathic Pain    hydrochlorothiazide  12.5 MG tablet Commonly known as: HYDRODIURIL  Take 1 tablet (12.5 mg total) by mouth daily. Start taking on: October 20, 2023   Indication: High Blood Pressure    naltrexone  50 MG tablet Commonly known as: DEPADE  Take 1 tablet (50 mg total) by mouth daily. Start taking on: October 20, 2023   Indication: Abuse or Misuse of Alcohol     PARoxetine  25 MG 24 hr tablet Commonly known as: PAXIL -CR Take 1 tablet (25 mg total) by mouth daily. Start taking on: October 20, 2023   Indication: Major Depressive Disorder    prazosin  1 MG capsule Commonly known as: MINIPRESS  Take 1 capsule (1 mg total) by mouth at bedtime.   Indication: Frightening  Dreams    QUEtiapine  200 MG tablet Commonly known as: SEROQUEL  Take 1 tablet (200 mg total) by mouth at bedtime.   Indication: Schizophrenia      Plan:  Tevyn will return on 10-29-23 at 1pm for individual therapy.  Evan Simpler, MS, LMFT, LCAS

## 2023-10-24 ENCOUNTER — Telehealth (HOSPITAL_COMMUNITY): Payer: MEDICAID | Admitting: Registered Nurse

## 2023-10-24 ENCOUNTER — Encounter (HOSPITAL_COMMUNITY): Payer: Self-pay | Admitting: Registered Nurse

## 2023-10-24 DIAGNOSIS — F411 Generalized anxiety disorder: Secondary | ICD-10-CM

## 2023-10-24 DIAGNOSIS — F250 Schizoaffective disorder, bipolar type: Secondary | ICD-10-CM | POA: Diagnosis not present

## 2023-10-24 DIAGNOSIS — F1020 Alcohol dependence, uncomplicated: Secondary | ICD-10-CM

## 2023-10-24 DIAGNOSIS — F330 Major depressive disorder, recurrent, mild: Secondary | ICD-10-CM

## 2023-10-24 DIAGNOSIS — F4310 Post-traumatic stress disorder, unspecified: Secondary | ICD-10-CM | POA: Diagnosis not present

## 2023-10-24 MED ORDER — GABAPENTIN 400 MG PO CAPS: 400.0000 mg | ORAL_CAPSULE | Freq: Three times a day (TID) | ORAL | 1 refills | Status: DC

## 2023-10-24 MED ORDER — BACLOFEN 10 MG PO TABS: 10.0000 mg | ORAL_TABLET | Freq: Three times a day (TID) | ORAL | 1 refills | Status: DC

## 2023-10-24 MED ORDER — PAROXETINE HCL ER 25 MG PO TB24
25.0000 mg | ORAL_TABLET | Freq: Every day | ORAL | 1 refills | Status: DC
Start: 2023-10-24 — End: 2023-12-21

## 2023-10-24 MED ORDER — QUETIAPINE FUMARATE 200 MG PO TABS
200.0000 mg | ORAL_TABLET | Freq: Every day | ORAL | 1 refills | Status: DC
Start: 2023-10-24 — End: 2023-12-21

## 2023-10-24 MED ORDER — PRAZOSIN HCL 1 MG PO CAPS: 1.0000 mg | ORAL_CAPSULE | Freq: Every day | ORAL | 1 refills | Status: DC

## 2023-10-24 MED ORDER — NALTREXONE HCL 50 MG PO TABS
50.0000 mg | ORAL_TABLET | Freq: Every day | ORAL | 1 refills | Status: DC
Start: 2023-10-24 — End: 2023-12-21

## 2023-10-24 NOTE — Patient Instructions (Addendum)
 If no one has contacted, you by the end of business day today please call the appropriate office listed below to schedule your next visit for medication management with Luisa Ruder, NP:    [facility] [address] [place], [address], [place], [place] [address]  5.4 mi Phone: [phone] (Call to schedule appointment)  [facility] at [facility] [address] [place], [place] [address]  [address] mi Phone: [phone] (Call to schedule appointment)      CONTACT [employer] OCS Manager [phone]  [facility] Police Behavioral Health Response Team [place]) The Behavioral Health Response Team [facility]) has been specially trained to handle incidents involving persons with mental illness and those in crisis with care and expertise, ensuring such persons receive appropriate responses based on their needs. The City's BHRT consists of [employer] and [employer] of licensed clinicians and crisis counselors.  Our clinicians' team is tasked with diverting certain people away from the criminal justice system and toward treatment, whenever appropriate and available.   Safety Plan Rayshon Albaugh will reach out to his wife Young Barge), call 911, [phone], mobile crisis, [facility] Police [employer] [facility] [place]), or present to the nearest emergency room should he experience any suicidal/homicidal ideation, auditory/visual/hallucinations, or detrimental worsening of his mental health.  He is to follow up with [facility], [place] with Luisa Ruder for medication management and [facility] with Darice Simpler, LCAS for counseling/therapy services.    The suicide prevention education provided includes the following: Suicide risk factors Suicide prevention and interventions National Suicide Hotline telephone number [facility]  assessment telephone number [facility] Emergency Assistance 911 [facility] and/or Residential Mobile Crisis Unit telephone number Request made of Tayten and his wife Harlene to: [employer] (e.g., guns, rifles, knives), all items previously/currently identified as safety concern.   Remove drugs/medications (over the counter, prescriptions, illicit drugs), all items previously/currently identified as a safety concern.    Mobile Crisis Response Teams Listed by counties in vicinity of [facility] providers [facility] [phone] [facility] [phone] [facility] [phone] [facility] [phone] [place]                * [facility] [phone]                * [employer] [phone]  [facility] [phone] [facility] [employer]  [phone] * [employer] [phone]
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
## 2023-10-25 ENCOUNTER — Ambulatory Visit (HOSPITAL_COMMUNITY): Payer: MEDICAID

## 2023-10-29 ENCOUNTER — Ambulatory Visit (INDEPENDENT_AMBULATORY_CARE_PROVIDER_SITE_OTHER): Payer: MEDICAID

## 2023-10-29 ENCOUNTER — Encounter (HOSPITAL_COMMUNITY): Payer: Self-pay

## 2023-10-29 DIAGNOSIS — F4310 Post-traumatic stress disorder, unspecified: Secondary | ICD-10-CM

## 2023-10-29 DIAGNOSIS — F1020 Alcohol dependence, uncomplicated: Secondary | ICD-10-CM

## 2023-10-29 DIAGNOSIS — F250 Schizoaffective disorder, bipolar type: Secondary | ICD-10-CM | POA: Diagnosis not present

## 2023-10-29 NOTE — Progress Notes (Signed)
        THERAPIST PROGRESS NOTE  Session Time: 1:00 pm to 1:54 pm  Type of Therapy: Individual   Therapist Response/Interventions: CBT/ grounding techniques for anxiety/Praised Evan Wilson for what he is already doing to make progress. Evan Wilson agrees to try to catch himself if he gets anxious and do grounding techniques for homework  Treatment Goals addressed:     Treatment Goals: Template: Substance Use Disorder Substance Use Treatment Plan 09/27/23 Effective from: 09/27/2023 Effective to: 03/29/2024 Plan ID: [id number] Problem: Substance Use Dates: Start: 09/27/23 Disciplines: Interdisciplinary, PROVIDER Goal: Evan Wilson will report complete abstinence from drugs and alcohol  per self report and weekly UDS while also attending 12 step meetings, obtaining a sponsor and beginning step work per self report Dates: Start: 09/27/23 Expected End: 03/29/24 Disciplines: Interdisciplinary, PROVIDER Goal: Evan Wilson will report a decrease in his depression and anxiety AEB reporting no higher than a 4 on the PHQ-9 and the GAD-7 Dates: Start: 09/27/23 Expected End: 03/29/24 Disciplines: Interdisciplinary, PROVIDER Intervention: Therapist will educate Evan Wilson about SUDS, patterns and consequence of use, relapse risks, the treatment process, types of mutual support groups and provide early recovery and relapse prevention skills Dates: Start: 09/27/23 Intervention: Therapist will assist Evan Wilson in identifying thoughts and behaviors taht can contribute to feelings of depression and anxiety. Dates: Start: 09/27/23 Description: Evan Wilson gives this therapist verbal permission to electronically sign his care plan Outcomes Date/Time User Outcome 09/27/23 1039 Darice SAUNDERS Initial    Summary: Evan Wilson presents in person today. He says he has only had 1/2 can of beer since he last presented. He says the naltrexone  and baclofen  are helping a lot. He says he is not having any cravings at this juncture. Evan Wilson discussed what he is doing with his time  which is organizing and cleaning his tools, planting flowers, keeping his yard up and mowing and fixing things that are broken for his elderly neighbors.  Evan Wilson tells this therapist of all of the flowers he has plant including roses, hibiscuses, Azaleas, foliage with yellow and orange and mum. Evan Wilson says his anxiety is so high, he has to stay busy. He says he has painted his boy's room blue and his daughter's room is purple.  Evan Wilson asks this therapist to write him a letter confirming he is engaged in therapy and psychiatry so he can give it to his probation officer. He says he will get the letter the next time he is in for therapy.  Progress Towards Goals: progressing  Suicidal/Homicidal:  denies  Plan: Return again ion 11-14-23 at 10 am  Diagnosis: Schizoaffective Disorder, Mixed, Alcohol  Use Disorder, Severe, PTSD and Generalized Anxiety Disorder.  Collaboration of Care: n/a  Patient/Guardian was advised Release of Information must be obtained prior to any record release in order to collaborate their care with an outside provider. Patient/Guardian was advised if they have not already done so to contact the registration department to sign all necessary forms in order for us  to release information regarding their care.   Consent: Patient/Guardian gives verbal consent for treatment and assignment of benefits for services provided during this visit. Patient/Guardian expressed understanding and agreed to proceed.   Darice Thomas Mabry,MS.  LMFT, LCAS

## 2023-10-30 ENCOUNTER — Ambulatory Visit (HOSPITAL_COMMUNITY): Payer: MEDICAID

## 2023-10-30 ENCOUNTER — Other Ambulatory Visit: Payer: Self-pay

## 2023-10-30 ENCOUNTER — Emergency Department (HOSPITAL_BASED_OUTPATIENT_CLINIC_OR_DEPARTMENT_OTHER)
Admission: EM | Admit: 2023-10-30 | Discharge: 2023-10-30 | Discharge status: Against Medical Advice | Payer: MEDICAID | Attending: Emergency Medicine | Admitting: Emergency Medicine

## 2023-10-30 DIAGNOSIS — Z5321 Procedure and treatment not carried out due to patient leaving prior to being seen by health care provider: Secondary | ICD-10-CM | POA: Diagnosis not present

## 2023-10-30 DIAGNOSIS — L0211 Cutaneous abscess of neck: Secondary | ICD-10-CM | POA: Insufficient documentation

## 2023-10-30 NOTE — ED Triage Notes (Signed)
 Pt caox4, ambulatory NAD c/o mass on L side of neck, states he noticed it approx 4 days ago and thought it would go away but it was gotten bigger. Pt denies hx of same, denies any recent dental procedure or dental pain. Pt denies fever/chills.

## 2023-10-31 ENCOUNTER — Emergency Department (HOSPITAL_BASED_OUTPATIENT_CLINIC_OR_DEPARTMENT_OTHER)
Admission: EM | Admit: 2023-10-31 | Discharge: 2023-10-31 | Discharge status: Against Medical Advice | Payer: MEDICAID | Attending: Emergency Medicine | Admitting: Emergency Medicine

## 2023-10-31 DIAGNOSIS — L0211 Cutaneous abscess of neck: Secondary | ICD-10-CM | POA: Diagnosis present

## 2023-10-31 DIAGNOSIS — Z5329 Procedure and treatment not carried out because of patient's decision for other reasons: Secondary | ICD-10-CM | POA: Insufficient documentation

## 2023-10-31 NOTE — ED Triage Notes (Signed)
 Patient states abscess to side of neck x5 days. Today abscess is draining.

## 2023-10-31 NOTE — ED Provider Notes (Signed)
  [place] [facility] AT [facility] Provider Note   CSN: [id number] Arrival date & time: 10/31/23  1317     Patient presents with: Abscess   Evan Wilson is a 48 y.o. male.   Patient left prior to being placed in the room.  He never got there.  Triage complaint was an abscess to the side of his neck for 5 days that was draining.  Unable to find the patient.       Prior to Admission medications   Medication Sig Start Date End Date Taking? Authorizing Provider  baclofen  (LIORESAL ) 10 MG tablet Take 1 tablet (10 mg total) by mouth 3 (three) times daily. 10/24/23 01/22/24  Rankin, Shuvon B, NP  gabapentin  (NEURONTIN ) 400 MG capsule Take 1 capsule (400 mg total) by mouth 3 (three) times daily. 10/24/23   Rankin, Shuvon B, NP  hydrochlorothiazide  (HYDRODIURIL ) 12.5 MG tablet Take 1 tablet (12.5 mg total) by mouth daily. 10/20/23 11/19/23  Towana Leita SAILOR, MD  naltrexone  (DEPADE) 50 MG tablet Take 1 tablet (50 mg total) by mouth daily. 10/24/23   Rankin, Shuvon B, NP  PARoxetine  (PAXIL -CR) 25 MG 24 hr tablet Take 1 tablet (25 mg total) by mouth daily. 10/24/23   Rankin, Shuvon B, NP  prazosin  (MINIPRESS ) 1 MG capsule Take 1 capsule (1 mg total) by mouth at bedtime. 10/24/23   Rankin, Shuvon B, NP  QUEtiapine  (SEROQUEL ) 200 MG tablet Take 1 tablet (200 mg total) by mouth at bedtime. 10/24/23   Rankin, Shuvon B, NP    Allergies: Trazodone, Toradol  [ketorolac  tromethamine ], and Tramadol     Review of Systems  Updated Vital Signs BP 103/69   Pulse 88   Temp 98.3 F (36.8 C) (Oral)   Resp 16   SpO2 100%   Physical Exam  (all labs ordered are listed, but only abnormal results are displayed) Labs Reviewed - No data to display  EKG: None  Radiology: No results found.   Procedures   Medications Ordered in the ED - No data to display                                  Medical Decision Making  Patient left prior to being brought back to the room.  Even though  they had a marked as room 2. Final diagnoses:  Neck abscess    ED Discharge Orders     None          Geraldene Hamilton, MD 10/31/23 1551

## 2023-11-01 ENCOUNTER — Ambulatory Visit (HOSPITAL_COMMUNITY): Payer: MEDICAID

## 2023-11-04 ENCOUNTER — Ambulatory Visit (HOSPITAL_COMMUNITY): Payer: MEDICAID

## 2023-11-06 ENCOUNTER — Ambulatory Visit (HOSPITAL_COMMUNITY): Payer: MEDICAID

## 2023-11-08 ENCOUNTER — Ambulatory Visit (HOSPITAL_COMMUNITY): Payer: MEDICAID

## 2023-11-11 ENCOUNTER — Ambulatory Visit (HOSPITAL_COMMUNITY): Payer: MEDICAID

## 2023-11-13 ENCOUNTER — Ambulatory Visit (HOSPITAL_COMMUNITY): Payer: MEDICAID

## 2023-11-14 ENCOUNTER — Ambulatory Visit (INDEPENDENT_AMBULATORY_CARE_PROVIDER_SITE_OTHER): Payer: MEDICAID

## 2023-11-14 DIAGNOSIS — F4310 Post-traumatic stress disorder, unspecified: Secondary | ICD-10-CM

## 2023-11-14 DIAGNOSIS — F250 Schizoaffective disorder, bipolar type: Secondary | ICD-10-CM

## 2023-11-14 DIAGNOSIS — F411 Generalized anxiety disorder: Secondary | ICD-10-CM

## 2023-11-14 DIAGNOSIS — F1020 Alcohol dependence, uncomplicated: Secondary | ICD-10-CM

## 2023-11-14 NOTE — Progress Notes (Signed)
        THERAPIST PROGRESS NOTE  Session Time: 10:00 am to 10;57 am  Type of Therapy: Individual   Therapist Response/Interventions: CBT/ inquired if Coleton is practicing grounding techniques for his anxiety, discussed how some non alcoholic beers still have a low percentage of alcohol  in them. Encouraged Dade to engage in 12 step meetings. Therapist shows him the Everything AA app.  He agrees to think about and possibly explore virtual meetings.  Treatment Goals addressed:     Treatment Goals: Template: Substance Use Disorder Substance Use Treatment Plan 09/27/23 Effective from: 09/27/2023 Effective to: 03/29/2024 Plan ID: [id number] Problem: Substance Use Dates: Start: 09/27/23 Disciplines: Interdisciplinary, PROVIDER Goal: Kerim will report complete abstinence from drugs and alcohol  per self report and weekly UDS while also attending 12 step meetings, obtaining a sponsor and beginning step work per self report Dates: Start: 09/27/23 Expected End: 03/29/24 Disciplines: Interdisciplinary, PROVIDER Goal: Edrik will report a decrease in his depression and anxiety AEB reporting no higher than a 4 on the PHQ-9 and the GAD-7 Dates: Start: 09/27/23 Expected End: 03/29/24 Disciplines: Interdisciplinary, PROVIDER Intervention: Therapist will educate [name] about SUDS, patterns and consequence of use, relapse risks, the treatment process, types of mutual support groups and provide early recovery and relapse prevention skills Dates: Start: 09/27/23 Intervention: Therapist will assist Karmelo in identifying thoughts and behaviors taht can contribute to feelings of depression and anxiety. Dates: Start: 09/27/23 Description: Nikos gives this therapist verbal permission to electronically sign his care plan Outcomes Date/Time User Outcome 09/27/23 1039 Darice SAUNDERS Initial    Summary: Edrik presents in person today. He says he had an 8 oz can non alcoholic beer since the time we met. Herson says he did not realize non  alcoholic beer could still have some alcohol  content. Therapist encourages him to check the ingredients and if it has alcohol  to get rid of it.  Orlandis says he feels like he is making progress because he only drank the one 8 oz non alcoholic beer.  When therapist discusses 12 step meetings and shows Tania how to find the meetings, he pulled out his phone and downloaded the app. Meko reports he is sleeping better with the Serequel. He says prior to taking it, he would be up quite late at nights. Jaison discusses how he keeps busy to avoid idle time for his mind to be occupied with seeking alcohol .  Millan reports his supports as his wife and aunt.  He says he speaks often to his aunt.  Oumar rates his depression today as a 4 and his anxiety as  a 3.  Progress Towards Goals: progressing  Suicidal/Homicidal:  denies  Plan: Return again on 11-21-23 at 9 am.  Diagnosis: Schizoaffective Disorder, Mixed, Alcohol  Use Disorder, Severe, PTSD and Generalized Anxiety Disorder.  Collaboration of Care: n/a  Patient/Guardian was advised Release of Information must be obtained prior to any record release in order to collaborate their care with an outside provider. Patient/Guardian was advised if they have not already done so to contact the registration department to sign all necessary forms in order for us  to release information regarding their care.   Consent: Patient/Guardian gives verbal consent for treatment and assignment of benefits for services provided during this visit. Patient/Guardian expressed understanding and agreed to proceed.   Darice Simpler, MS.  LMFT, LCAS

## 2023-11-15 ENCOUNTER — Ambulatory Visit (HOSPITAL_COMMUNITY): Payer: MEDICAID

## 2023-11-21 ENCOUNTER — Ambulatory Visit (INDEPENDENT_AMBULATORY_CARE_PROVIDER_SITE_OTHER): Payer: MEDICAID

## 2023-11-21 DIAGNOSIS — F4310 Post-traumatic stress disorder, unspecified: Secondary | ICD-10-CM

## 2023-11-21 DIAGNOSIS — F1020 Alcohol dependence, uncomplicated: Secondary | ICD-10-CM

## 2023-11-21 DIAGNOSIS — F250 Schizoaffective disorder, bipolar type: Secondary | ICD-10-CM

## 2023-11-21 DIAGNOSIS — F411 Generalized anxiety disorder: Secondary | ICD-10-CM

## 2023-11-21 NOTE — Progress Notes (Signed)
        THERAPIST PROGRESS NOTE  Session Time: 10:00 am to 10;57 am  Type of Therapy: Individual   Therapist Response/Interventions: Active listening and CBT, psycho-education. Regarding the % of alcohol  allowed in beers advertised as non alcoholic. Encourage Evan Wilson to continue to be active  Treatment Goals addressed:     Treatment Goals: Template: Substance Use Disorder Substance Use Treatment Plan 09/27/23 Effective from: 09/27/2023 Effective to: 03/29/2024 Plan ID: [id number] Problem: Substance Use Dates: Start: 09/27/23 Disciplines: Interdisciplinary, PROVIDER Goal: Evan Wilson will report complete abstinence from drugs and alcohol  per self report and weekly UDS while also attending 12 step meetings, obtaining a sponsor and beginning step work per self report Dates: Start: 09/27/23 Expected End: 03/29/24 Disciplines: Interdisciplinary, PROVIDER Goal: Evan Wilson will report a decrease in his depression and anxiety AEB reporting no higher than a 4 on the PHQ-9 and the GAD-7 Dates: Start: 09/27/23 Expected End: 03/29/24 Disciplines: Interdisciplinary, PROVIDER Intervention: Therapist will educate Evan Wilson about SUDS, patterns and consequence of use, relapse risks, the treatment process, types of mutual support groups and provide early recovery and relapse prevention skills Dates: Start: 09/27/23 Intervention: Therapist will assist Evan Wilson in identifying thoughts and behaviors taht can contribute to feelings of depression and anxiety. Dates: Start: 09/27/23 Description: Evan Wilson gives this therapist verbal permission to electronically sign his care plan Outcomes Date/Time User Outcome 09/27/23 1039 Darice SAUNDERS Initial    Summary: Evan Wilson presents in person today.  He says he is staying busy. He reports he mowed the elderly couples yard yesterday. Evan Wilson says he  has started drinking [place] no alcohol  beers but only drinks them occasionally. Therapist reminds him that these beers are allowed to have 0.5% alcohol  and be  labeled as Non-alcoholic.    Evan Wilson says each night he sets goals for the next day so he won't have idle time and find this good for his mental health. He says staying active is good for him physically and mentally.   Evan Wilson discusses the various jobs he has worked in the past.  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   Evan Wilson rates his depression today as a 4 and his anxiety as  a 0.   Progress Towards Goals: progressing  Suicidal/Homicidal:  denies  Plan: Return again on 11-28-23 at 1pm  Diagnosis: Schizoaffective Disorder, Mixed, Alcohol  Use Disorder, Severe, PTSD and Generalized Anxiety Disorder.  Collaboration of Care: n/a  Patient/Guardian was advised Release of Information must be obtained prior to any record release in order to collaborate their care with an outside provider. Patient/Guardian was advised if they have not already done so to contact the registration department to sign all necessary forms in order for us  to release information regarding their care.   Consent: Patient/Guardian gives verbal consent for treatment and assignment of benefits for services provided during this visit. Patient/Guardian expressed understanding and agreed to proceed.   Darice Simpler, MS.  LMFT, LCAS

## 2023-11-25 ENCOUNTER — Other Ambulatory Visit: Payer: Self-pay | Admitting: Family Medicine

## 2023-11-25 DIAGNOSIS — F4310 Post-traumatic stress disorder, unspecified: Secondary | ICD-10-CM

## 2023-11-25 DIAGNOSIS — F1020 Alcohol dependence, uncomplicated: Secondary | ICD-10-CM

## 2023-11-25 DIAGNOSIS — F411 Generalized anxiety disorder: Secondary | ICD-10-CM

## 2023-11-25 DIAGNOSIS — F250 Schizoaffective disorder, bipolar type: Secondary | ICD-10-CM

## 2023-11-25 DIAGNOSIS — F330 Major depressive disorder, recurrent, mild: Secondary | ICD-10-CM

## 2023-11-25 NOTE — Telephone Encounter (Signed)
 Copied from CRM #[id number]. Topic: Clinical - Medication Refill >> Nov 25, 2023 12:34 PM Montie POUR wrote: Medication:  QUEtiapine  (SEROQUEL ) 200 MG tablet gabapentin  (NEURONTIN ) 400 MG capsule  Has the patient contacted their pharmacy? Yes (Agent: If no, request that the patient contact the pharmacy for the refill. If patient does not wish to contact the pharmacy document the reason why and proceed with request.) (Agent: If yes, when and what did the pharmacy advise?) Pharmacy needs order for refill  This is the patient's preferred pharmacy:  [id number] [place], [place] - [address] AT [facility] [address] [facility] [address] [place] [address] Phone: [phone] Fax: [phone]  Is this the correct pharmacy for this prescription? Yes If no, delete pharmacy and type the correct one.   Has the prescription been filled recently? No  Is the patient out of the medication? Yes  Has the patient been seen for an appointment in the last year OR does the patient have an upcoming appointment? Yes  Can we respond through MyChart? No  Agent: Please be advised that Rx refills may take up to 3 business days. We ask that you follow-up with your pharmacy.

## 2023-11-28 ENCOUNTER — Encounter: Payer: Self-pay | Admitting: Medical

## 2023-11-28 ENCOUNTER — Ambulatory Visit (HOSPITAL_COMMUNITY): Payer: MEDICAID

## 2023-11-28 NOTE — Progress Notes (Deleted)
 Front desk alerted this therapist that Evan Wilson called and said he could not make it to his appointment today and asked to be called for another appointment. His next scheduled appt is already scheduled and is on December 12, 2023 at 1pm.  Front desk calls to confirm this appointment.  Darice Simpler, MS, LMFT, LCAS

## 2023-11-29 ENCOUNTER — Ambulatory Visit: Payer: Self-pay | Admitting: Physician Assistant

## 2023-11-29 ENCOUNTER — Other Ambulatory Visit: Payer: Self-pay | Admitting: Family Medicine

## 2023-11-29 DIAGNOSIS — F411 Generalized anxiety disorder: Secondary | ICD-10-CM

## 2023-11-29 DIAGNOSIS — F330 Major depressive disorder, recurrent, mild: Secondary | ICD-10-CM

## 2023-11-29 DIAGNOSIS — F4310 Post-traumatic stress disorder, unspecified: Secondary | ICD-10-CM

## 2023-11-29 DIAGNOSIS — F1020 Alcohol dependence, uncomplicated: Secondary | ICD-10-CM

## 2023-11-29 DIAGNOSIS — F250 Schizoaffective disorder, bipolar type: Secondary | ICD-10-CM

## 2023-11-29 NOTE — Telephone Encounter (Signed)
 Copied from CRM #[id number]. Topic: Clinical - Medication Refill >> Nov 29, 2023 10:08 AM Delon DASEN wrote: Provider Hilario is on leave Medication: gabapentin  (NEURONTIN ) 400 MG capsule QUEtiapine  (SEROQUEL ) 200 MG tablet  Has the patient contacted their pharmacy? No (Agent: If no, request that the patient contact the pharmacy for the refill. If patient does not wish to contact the pharmacy document the reason why and proceed with request.) (Agent: If yes, when and what did the pharmacy advise?)  This is the patient's preferred pharmacy:  [id number] [place], [place] - [address] AT [facility] [address] [facility] [address] [place] [address] Phone: [phone] Fax: [phone]  Is this the correct pharmacy for this prescription? Yes If no, delete pharmacy and type the correct one.   Has the prescription been filled recently? Yes  Is the patient out of the medication? Yes  Has the patient been seen for an appointment in the last year OR does the patient have an upcoming appointment? Yes  Can we respond through MyChart? Yes  Agent: Please be advised that Rx refills may take up to 3 business days. We ask that you follow-up with your pharmacy.

## 2023-12-03 ENCOUNTER — Telehealth: Payer: Self-pay

## 2023-12-03 NOTE — Telephone Encounter (Signed)
 Copied from CRM [id number]. Topic: Clinical - Medication Question >> Dec 03, 2023 11:37 AM Tobias CROME wrote: Reason for CRM: Patient states he had appointment last week but it was cancelled. Patient informed a different provider would send medication in for him. Patient inquiring if the seroquil will be sent in for him.   Patient requested refill on 10/3 and it was denied as he is needing appointment.   Patient requesting clarification as his appointment was cancelled.   Requesting callback: [phone]

## 2023-12-04 NOTE — Telephone Encounter (Signed)
 scheduled

## 2023-12-06 ENCOUNTER — Other Ambulatory Visit: Payer: Self-pay | Admitting: Family Medicine

## 2023-12-06 DIAGNOSIS — F411 Generalized anxiety disorder: Secondary | ICD-10-CM

## 2023-12-06 DIAGNOSIS — F330 Major depressive disorder, recurrent, mild: Secondary | ICD-10-CM

## 2023-12-06 DIAGNOSIS — F250 Schizoaffective disorder, bipolar type: Secondary | ICD-10-CM

## 2023-12-06 DIAGNOSIS — F4310 Post-traumatic stress disorder, unspecified: Secondary | ICD-10-CM

## 2023-12-06 NOTE — Telephone Encounter (Unsigned)
 Copied from CRM #[id number]. Topic: Clinical - Medication Refill >> Dec 06, 2023  4:07 PM Fonda T wrote: Medication: QUEtiapine  (SEROQUEL ) 200 MG tablet  Has the patient contacted their pharmacy? Yes, advised to contact office   This is the patient's preferred pharmacy:  [id number] [place], [place] - [address] AT [facility] OF [address] & [facility] [address] [place] [address] Phone: [phone] Fax: [phone]  Is this the correct pharmacy for this prescription? Yes If no, delete pharmacy and type the correct one.   Has the prescription been filled recently? Yes  Is the patient out of the medication? Yes  Has the patient been seen for an appointment in the last year OR does the patient have an upcoming appointment? Yes, 12/30/23  Can we respond through MyChart? No, prefers call on [phone]  Agent: Please be advised that Rx refills may take up to 3 business days. We ask that you follow-up with your pharmacy.

## 2023-12-12 ENCOUNTER — Telehealth: Payer: Self-pay

## 2023-12-12 ENCOUNTER — Inpatient Hospital Stay (HOSPITAL_COMMUNITY)
Admission: AD | Admit: 2023-12-12 | Discharge: 2023-12-21 | DRG: 885 | Disposition: A | Discharge status: Home / Self Care | Payer: MEDICAID | Source: Intra-hospital | Attending: Student in an Organized Health Care Education/Training Program | Admitting: Student in an Organized Health Care Education/Training Program

## 2023-12-12 ENCOUNTER — Ambulatory Visit (HOSPITAL_COMMUNITY): Payer: MEDICAID

## 2023-12-12 ENCOUNTER — Other Ambulatory Visit: Payer: Self-pay

## 2023-12-12 ENCOUNTER — Ambulatory Visit (HOSPITAL_COMMUNITY)
Admission: EM | Admit: 2023-12-12 | Discharge: 2023-12-12 | Disposition: A | Discharge status: Other Acute Inpatient Hospital | Payer: MEDICAID | Attending: Behavioral Health | Admitting: Behavioral Health

## 2023-12-12 ENCOUNTER — Telehealth (HOSPITAL_COMMUNITY): Payer: Self-pay

## 2023-12-12 DIAGNOSIS — M159 Polyosteoarthritis, unspecified: Secondary | ICD-10-CM | POA: Diagnosis present

## 2023-12-12 DIAGNOSIS — Z9151 Personal history of suicidal behavior: Secondary | ICD-10-CM | POA: Diagnosis not present

## 2023-12-12 DIAGNOSIS — Z888 Allergy status to other drugs, medicaments and biological substances status: Secondary | ICD-10-CM | POA: Diagnosis not present

## 2023-12-12 DIAGNOSIS — F250 Schizoaffective disorder, bipolar type: Secondary | ICD-10-CM

## 2023-12-12 DIAGNOSIS — R45851 Suicidal ideations: Secondary | ICD-10-CM | POA: Diagnosis present

## 2023-12-12 DIAGNOSIS — F411 Generalized anxiety disorder: Secondary | ICD-10-CM | POA: Diagnosis present

## 2023-12-12 DIAGNOSIS — G8929 Other chronic pain: Secondary | ICD-10-CM | POA: Diagnosis present

## 2023-12-12 DIAGNOSIS — Z8249 Family history of ischemic heart disease and other diseases of the circulatory system: Secondary | ICD-10-CM | POA: Diagnosis not present

## 2023-12-12 DIAGNOSIS — F4000 Agoraphobia, unspecified: Secondary | ICD-10-CM | POA: Diagnosis present

## 2023-12-12 DIAGNOSIS — F330 Major depressive disorder, recurrent, mild: Secondary | ICD-10-CM

## 2023-12-12 DIAGNOSIS — F4312 Post-traumatic stress disorder, chronic: Secondary | ICD-10-CM | POA: Diagnosis present

## 2023-12-12 DIAGNOSIS — Z79899 Other long term (current) drug therapy: Secondary | ICD-10-CM

## 2023-12-12 DIAGNOSIS — F332 Major depressive disorder, recurrent severe without psychotic features: Secondary | ICD-10-CM | POA: Diagnosis not present

## 2023-12-12 DIAGNOSIS — Y249XXS Unspecified firearm discharge, undetermined intent, sequela: Secondary | ICD-10-CM

## 2023-12-12 DIAGNOSIS — F515 Nightmare disorder: Secondary | ICD-10-CM | POA: Diagnosis not present

## 2023-12-12 DIAGNOSIS — F4310 Post-traumatic stress disorder, unspecified: Secondary | ICD-10-CM | POA: Diagnosis not present

## 2023-12-12 DIAGNOSIS — Z818 Family history of other mental and behavioral disorders: Secondary | ICD-10-CM | POA: Diagnosis not present

## 2023-12-12 DIAGNOSIS — E785 Hyperlipidemia, unspecified: Secondary | ICD-10-CM | POA: Diagnosis present

## 2023-12-12 DIAGNOSIS — F1090 Alcohol use, unspecified, uncomplicated: Secondary | ICD-10-CM

## 2023-12-12 DIAGNOSIS — Z823 Family history of stroke: Secondary | ICD-10-CM | POA: Diagnosis not present

## 2023-12-12 DIAGNOSIS — F322 Major depressive disorder, single episode, severe without psychotic features: Secondary | ICD-10-CM | POA: Diagnosis present

## 2023-12-12 DIAGNOSIS — F1020 Alcohol dependence, uncomplicated: Secondary | ICD-10-CM | POA: Insufficient documentation

## 2023-12-12 DIAGNOSIS — Z885 Allergy status to narcotic agent status: Secondary | ICD-10-CM | POA: Diagnosis not present

## 2023-12-12 DIAGNOSIS — F329 Major depressive disorder, single episode, unspecified: Secondary | ICD-10-CM | POA: Diagnosis not present

## 2023-12-12 LAB — CBC WITH DIFFERENTIAL/PLATELET
Abs Immature Granulocytes: 0.04 K/uL (ref 0.00–0.07)
Basophils Absolute: 0.1 K/uL (ref 0.0–0.1)
Basophils Relative: 1 %
Eosinophils Absolute: 0.1 K/uL (ref 0.0–0.5)
Eosinophils Relative: 1 %
HCT: 39.1 % (ref 39.0–52.0)
Hemoglobin: 12.7 g/dL — ABNORMAL LOW (ref 13.0–17.0)
Immature Granulocytes: 1 %
Lymphocytes Relative: 17 %
Lymphs Abs: 1.3 K/uL (ref 0.7–4.0)
MCH: 27.3 pg (ref 26.0–34.0)
MCHC: 32.5 g/dL (ref 30.0–36.0)
MCV: 83.9 fL (ref 80.0–100.0)
Monocytes Absolute: 1 K/uL (ref 0.1–1.0)
Monocytes Relative: 12 %
Neutro Abs: 5.4 K/uL (ref 1.7–7.7)
Neutrophils Relative %: 68 %
Platelets: 202 K/uL (ref 150–400)
RBC: 4.66 MIL/uL (ref 4.22–5.81)
RDW: 15.3 % (ref 11.5–15.5)
WBC: 7.9 K/uL (ref 4.0–10.5)
nRBC: 0 % (ref 0.0–0.2)

## 2023-12-12 LAB — COMPREHENSIVE METABOLIC PANEL WITH GFR
ALT: 39 U/L (ref 0–44)
AST: 48 U/L — ABNORMAL HIGH (ref 15–41)
Albumin: 3.4 g/dL — ABNORMAL LOW (ref 3.5–5.0)
Alkaline Phosphatase: 100 U/L (ref 38–126)
Anion gap: 9 (ref 5–15)
BUN: 8 mg/dL (ref 6–20)
CO2: 28 mmol/L (ref 22–32)
Calcium: 8.9 mg/dL (ref 8.9–10.3)
Chloride: 97 mmol/L — ABNORMAL LOW (ref 98–111)
Creatinine, Ser: 0.78 mg/dL (ref 0.61–1.24)
GFR, Estimated: >60 mL/min (ref >60)
Glucose, Bld: 98 mg/dL (ref 70–99)
Potassium: 3.7 mmol/L (ref 3.5–5.1)
Sodium: 134 mmol/L — ABNORMAL LOW (ref 135–145)
Total Bilirubin: 1.1 mg/dL (ref 0.0–1.2)
Total Protein: 7.3 g/dL (ref 6.5–8.1)

## 2023-12-12 LAB — POCT URINE DRUG SCREEN - MANUAL ENTRY (I-SCREEN)
POC Amphetamine UR: NOT DETECTED
POC Buprenorphine (BUP): NOT DETECTED
POC Cocaine UR: NOT DETECTED
POC Marijuana UR: NOT DETECTED
POC Methadone UR: NOT DETECTED
POC Methamphetamine UR: NOT DETECTED
POC Morphine: NOT DETECTED
POC Oxazepam (BZO): POSITIVE — AB
POC Oxycodone UR: POSITIVE — AB
POC Secobarbital (BAR): NOT DETECTED

## 2023-12-12 LAB — ETHANOL: Alcohol, Ethyl (B): <15 mg/dL (ref <15)

## 2023-12-12 MED ORDER — HALOPERIDOL LACTATE 5 MG/ML IJ SOLN: 5.0000 mg | Freq: Three times a day (TID) | INTRAMUSCULAR | Status: DC | PRN

## 2023-12-12 MED ORDER — DIPHENHYDRAMINE HCL 50 MG/ML IJ SOLN: 50.0000 mg | Freq: Three times a day (TID) | INTRAMUSCULAR | Status: DC | PRN

## 2023-12-12 MED ORDER — MAGNESIUM HYDROXIDE 400 MG/5ML PO SUSP: 30.0000 mL | Freq: Every day | ORAL | Status: DC | PRN

## 2023-12-12 MED ORDER — HALOPERIDOL 5 MG PO TABS: 5.0000 mg | ORAL_TABLET | Freq: Three times a day (TID) | ORAL | Status: DC | PRN

## 2023-12-12 MED ORDER — VITAMIN B-1 100 MG PO TABS
100.0000 mg | ORAL_TABLET | Freq: Every day | ORAL | Status: DC
  Administered 2023-12-13 – 2023-12-21 (×9): 100 mg via ORAL
  Filled 2023-12-12 (×9): qty 1

## 2023-12-12 MED ORDER — HYDROXYZINE HCL 25 MG PO TABS
25.0000 mg | ORAL_TABLET | Freq: Four times a day (QID) | ORAL | Status: AC | PRN
  Administered 2023-12-13 – 2023-12-15 (×3): 25 mg via ORAL
  Filled 2023-12-12 (×3): qty 1

## 2023-12-12 MED ORDER — LOPERAMIDE HCL 2 MG PO CAPS: 2.0000 mg | ORAL_CAPSULE | ORAL | Status: AC | PRN

## 2023-12-12 MED ORDER — GABAPENTIN 400 MG PO CAPS
400.0000 mg | ORAL_CAPSULE | Freq: Three times a day (TID) | ORAL | Status: DC
  Administered 2023-12-12: 400 mg via ORAL
  Filled 2023-12-12: qty 1

## 2023-12-12 MED ORDER — ONDANSETRON 4 MG PO TBDP: 4.0000 mg | ORAL_TABLET | Freq: Four times a day (QID) | ORAL | Status: AC | PRN

## 2023-12-12 MED ORDER — GABAPENTIN 400 MG PO CAPS
400.0000 mg | ORAL_CAPSULE | Freq: Three times a day (TID) | ORAL | Status: DC
  Administered 2023-12-13 – 2023-12-21 (×25): 400 mg via ORAL
  Filled 2023-12-12 (×25): qty 1

## 2023-12-12 MED ORDER — LORAZEPAM 2 MG/ML IJ SOLN: 2.0000 mg | Freq: Three times a day (TID) | INTRAMUSCULAR | Status: DC | PRN

## 2023-12-12 MED ORDER — BACLOFEN 10 MG PO TABS
10.0000 mg | ORAL_TABLET | Freq: Three times a day (TID) | ORAL | Status: DC
Start: 2023-12-12 — End: 2023-12-12
  Administered 2023-12-12: 10 mg via ORAL
  Filled 2023-12-12: qty 1

## 2023-12-12 MED ORDER — HALOPERIDOL LACTATE 5 MG/ML IJ SOLN: 10.0000 mg | Freq: Three times a day (TID) | INTRAMUSCULAR | Status: DC | PRN

## 2023-12-12 MED ORDER — QUETIAPINE FUMARATE 200 MG PO TABS
200.0000 mg | ORAL_TABLET | Freq: Every day | ORAL | Status: DC
  Administered 2023-12-13 – 2023-12-20 (×8): 200 mg via ORAL
  Filled 2023-12-12 (×9): qty 1

## 2023-12-12 MED ORDER — BACLOFEN 10 MG PO TABS
10.0000 mg | ORAL_TABLET | Freq: Three times a day (TID) | ORAL | Status: DC
  Administered 2023-12-13 – 2023-12-21 (×25): 10 mg via ORAL
  Filled 2023-12-12 (×24): qty 1

## 2023-12-12 MED ORDER — ADULT MULTIVITAMIN W/MINERALS CH
1.0000 | ORAL_TABLET | Freq: Every day | ORAL | Status: DC
  Administered 2023-12-13 – 2023-12-21 (×9): 1 via ORAL
  Filled 2023-12-12 (×9): qty 1

## 2023-12-12 MED ORDER — THIAMINE HCL 100 MG/ML IJ SOLN: 100.0000 mg | Freq: Once | INTRAMUSCULAR | Status: DC

## 2023-12-12 MED ORDER — ALUM & MAG HYDROXIDE-SIMETH 200-200-20 MG/5ML PO SUSP: 30.0000 mL | ORAL | Status: DC | PRN

## 2023-12-12 MED ORDER — QUETIAPINE FUMARATE 200 MG PO TABS
200.0000 mg | ORAL_TABLET | Freq: Every day | ORAL | Status: DC
Start: 2023-12-12 — End: 2023-12-12
  Administered 2023-12-12: 200 mg via ORAL
  Filled 2023-12-12: qty 1

## 2023-12-12 MED ORDER — PRAZOSIN HCL 1 MG PO CAPS
1.0000 mg | ORAL_CAPSULE | Freq: Every day | ORAL | Status: DC
Start: 2023-12-13 — End: 2023-12-21
  Administered 2023-12-13 – 2023-12-20 (×8): 1 mg via ORAL
  Filled 2023-12-12 (×8): qty 1

## 2023-12-12 MED ORDER — PRAZOSIN HCL 1 MG PO CAPS
1.0000 mg | ORAL_CAPSULE | Freq: Every day | ORAL | Status: DC
  Administered 2023-12-12: 1 mg via ORAL
  Filled 2023-12-12 (×2): qty 1

## 2023-12-12 MED ORDER — PAROXETINE HCL ER 12.5 MG PO TB24
25.0000 mg | ORAL_TABLET | Freq: Every day | ORAL | Status: AC
Start: 2023-12-13 — End: ?
  Administered 2023-12-13: 25 mg via ORAL
  Filled 2023-12-12: qty 2

## 2023-12-12 MED ORDER — DIPHENHYDRAMINE HCL 50 MG PO CAPS: 50.0000 mg | ORAL_CAPSULE | Freq: Three times a day (TID) | ORAL | Status: DC | PRN

## 2023-12-12 MED ORDER — PAROXETINE HCL ER 12.5 MG PO TB24
25.0000 mg | ORAL_TABLET | Freq: Every day | ORAL | Status: DC
  Administered 2023-12-12: 25 mg via ORAL
  Filled 2023-12-12: qty 2

## 2023-12-12 MED ORDER — LORAZEPAM 1 MG PO TABS: 1.0000 mg | ORAL_TABLET | Freq: Four times a day (QID) | ORAL | Status: DC | PRN

## 2023-12-12 MED ORDER — ACETAMINOPHEN 325 MG PO TABS: 650.0000 mg | ORAL_TABLET | Freq: Four times a day (QID) | ORAL | Status: DC | PRN

## 2023-12-12 MED ORDER — ACETAMINOPHEN 325 MG PO TABS
650.0000 mg | ORAL_TABLET | Freq: Four times a day (QID) | ORAL | Status: DC | PRN
  Administered 2023-12-13: 650 mg via ORAL
  Filled 2023-12-12: qty 2

## 2023-12-12 MED ORDER — DIPHENHYDRAMINE HCL 25 MG PO CAPS: 50.0000 mg | ORAL_CAPSULE | Freq: Three times a day (TID) | ORAL | Status: DC | PRN

## 2023-12-12 NOTE — ED Notes (Signed)
Pt awake at this hour. No apparent distress. RR even and unlabored. Monitored for safety.

## 2023-12-12 NOTE — Progress Notes (Addendum)
        THERAPIST PROGRESS NOTE  Session Time: 1:00 pm to 1:53 pm  Type of Therapy: Individual   Therapist Response/Interventions: Active listening and CBT, psycho-education.   Treatment Goals addressed:     Treatment Goals: Template: Substance Use Disorder Substance Use Treatment Plan 09/27/23 Effective from: 09/27/2023 Effective to: 03/29/2024 Plan ID: [id number] Problem: Substance Use Dates: Start: 09/27/23 Disciplines: Interdisciplinary, PROVIDER Goal: Brinley will report complete abstinence from drugs and alcohol  per self report and weekly UDS while also attending 12 step meetings, obtaining a sponsor and beginning step work per self report Dates: Start: 09/27/23 Expected End: 03/29/24 Disciplines: Interdisciplinary, PROVIDER Goal: Kian will report a decrease in his depression and anxiety AEB reporting no higher than a 4 on the PHQ-9 and the GAD-7 Dates: Start: 09/27/23 Expected End: 03/29/24 Disciplines: Interdisciplinary, PROVIDER Intervention: Therapist will educate [name] about SUDS, patterns and consequence of use, relapse risks, the treatment process, types of mutual support groups and provide early recovery and relapse prevention skills Dates: Start: 09/27/23 Intervention: Therapist will assist Bhavya in identifying thoughts and behaviors taht can contribute to feelings of depression and anxiety. Dates: Start: 09/27/23 Description: Claborn gives this therapist verbal permission to electronically sign his care plan Outcomes Date/Time User Outcome 09/27/23 1039 Darice SAUNDERS Initial    Summary: Sheridan presents in person today.  He states he is not doing so well. He says he is out of his Serequel and Baclofen  and his PCP is out of the country and was told his prescriptions could not be refilled until she returns. He says  it has been 6 days since he had his last dose. Front desk sends a message that Kaleb's wife is in distress because he has been drinking and wants a return call.  Therapist texts  her supervisor, Elouise Birmingham, RN who says his prescriber is back in the office. Stylianos calls the office and reaches a staff who says they will send a message to get his medication refilled Mateen says if he can get his medication that he will stop drinking. Dmetrius admits he has been drink 4-6 Bud Light Limes for a new nights to go to sleep as he could not fall asleep without his serequel.  Nakhi says he has been busy getting up leaves.   Progress Towards Goals: progressing  Suicidal/Homicidal:Therapist informs Xayne that his wife was on the phone with front desk and said he had been threatening to kill himself.  Therapist asks Dervin if he is indeed thinking of hurting or killing himself and he adamantly denies. He says that she is just on him because of he had been drinking again.   Plan: Return again on 12-19-23 at 1pm.   Diagnosis: Schizoaffective Disorder, Mixed, Alcohol  Use Disorder, Severe, PTSD and Generalized Anxiety Disorder.  Collaboration of Care: n/a  Patient/Guardian was advised Release of Information must be obtained prior to any record release in order to collaborate their care with an outside provider. Patient/Guardian was advised if they have not already done so to contact the registration department to sign all necessary forms in order for us  to release information regarding their care.   Consent: Patient/Guardian gives verbal consent for treatment and assignment of benefits for services provided during this visit. Patient/Guardian expressed understanding and agreed to proceed.   Darice Simpler, MS.  LMFT, LCAS

## 2023-12-12 NOTE — Telephone Encounter (Signed)
 Evan Wilson called the office in tears sobbing. She is stating that Evan Wilson is still drinking heavily and visiting a drug house. She just poured out about 12 beers out that he had in the house. She stated 2 weeks ago she had COVID and her son went to get her car and found him at the drug house. She doesn't know what else to do. She is lost. She's is seeing both a therapist and a psychiatrist because of him. Her son dropped him off for his appointment today. He's not working and doesn't want to work. He will see Evan Wilson, then comes home and starts drinking beer immediately. Evan says she is lost. When she cries to him, she says that he just looks at her and doesn't say anything. She has hidden all the knives in the house because he has threatened to kill himself. The police came and did nothing...they took him to a mental health facility and he out in 3-4 days. She is on the verge of leaving him. I asked if the patient felt safe and after multiple attempts to clarify, I could not understand whether she felt safe or not in his presence. I encouraged the patient to reach out to her therapist and psychiatrist if she needs help to navigate the situation and to contact the police if she does not feel safe. She does not think the police are any help as they have not done anything in the past when she has called.  She stated that a week ago, he got a citation coming home from the drug house for driving without a license. The police searched the car and only issues a citation when they found nothing in the car. Two weeks ago, he threatened to kill himself in front of the police when his children were taken away. She is refusing to pick him up, or let her son pick him up, from his appointment today. In messaging Evan Wilson Evan Wilson has said in a secure chat that she is attempting to get Evan Wilson to go downstairs. In the secure chat, Evan Wilson said she would contact Evan after the appointment. The contact number is [phone].    The patient was still tearful at the end of the call, but reassured by the message Evan Wilson would call her.

## 2023-12-12 NOTE — ED Notes (Signed)
 Called report to RN at [facility] who will be receiving this patient
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
## 2023-12-12 NOTE — Telephone Encounter (Signed)
 Copied from CRM #[id number]. Topic: Clinical - Prescription Issue >> Dec 12, 2023  1:26 PM Delon T wrote: Reason for CRM: QUEtiapine  (SEROQUEL ) 200 MG tablet- still waiting for refill- called in on 10/10

## 2023-12-12 NOTE — ED Notes (Addendum)
 Patient admitted to obs unit. Patient currently denies SI,HI, and A/V/H with no plan or intent. Patient presents cooperative and received dinner. Patient is aware of pending transfer to [facility] and verbalizes understanding. Patient remains calm at this time with no s/s of current distress.

## 2023-12-12 NOTE — Discharge Instructions (Signed)
 Transfer to [facility]
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
## 2023-12-13 ENCOUNTER — Encounter (HOSPITAL_COMMUNITY): Payer: Self-pay

## 2023-12-13 ENCOUNTER — Encounter (HOSPITAL_COMMUNITY): Payer: Self-pay | Admitting: Behavioral Health

## 2023-12-13 DIAGNOSIS — F329 Major depressive disorder, single episode, unspecified: Secondary | ICD-10-CM

## 2023-12-13 DIAGNOSIS — F515 Nightmare disorder: Secondary | ICD-10-CM

## 2023-12-13 DIAGNOSIS — F411 Generalized anxiety disorder: Secondary | ICD-10-CM

## 2023-12-13 DIAGNOSIS — F4310 Post-traumatic stress disorder, unspecified: Secondary | ICD-10-CM

## 2023-12-13 DIAGNOSIS — F1020 Alcohol dependence, uncomplicated: Secondary | ICD-10-CM

## 2023-12-13 MED ORDER — NALTREXONE HCL 50 MG PO TABS
50.0000 mg | ORAL_TABLET | Freq: Every day | ORAL | Status: DC
  Administered 2023-12-13 – 2023-12-21 (×9): 50 mg via ORAL
  Filled 2023-12-13 (×9): qty 1

## 2023-12-13 MED ORDER — ACETAMINOPHEN 500 MG PO TABS
1000.0000 mg | ORAL_TABLET | Freq: Three times a day (TID) | ORAL | Status: DC
  Administered 2023-12-13 – 2023-12-21 (×23): 1000 mg via ORAL
  Filled 2023-12-13 (×23): qty 2

## 2023-12-13 MED ORDER — THIAMINE HCL 100 MG PO TABS: 100.0000 mg | ORAL_TABLET | Freq: Every day | ORAL | Status: DC

## 2023-12-13 MED ORDER — PAROXETINE HCL ER 12.5 MG PO TB24
37.5000 mg | ORAL_TABLET | Freq: Every day | ORAL | Status: DC
  Administered 2023-12-14 – 2023-12-21 (×8): 37.5 mg via ORAL
  Filled 2023-12-13 (×8): qty 3
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
## 2023-12-13 NOTE — Progress Notes (Signed)
 Patient denies SI/HI/AVH this morning. Pt does endorse mild anxiety/depression this morning. Pt is guarded during interaction and presents with a flat/depressed affect. Pt has been calm and cooperative throughout the day. Patient has been compliant with medications and treatment plan. Q 15 minute safety checks are in place for patient's safety. Patient is currently safe on the unit.   12/13/23 0856  Psych Admission Type (Psych Patients Only)  Admission Status Voluntary  Psychosocial Assessment  Patient Complaints Anxiety;Depression  Eye Contact Fair  Facial Expression Flat  Affect Sad  Speech Soft;Slurred  Interaction Guarded;Isolative;Minimal  Motor Activity Slow  Appearance/Hygiene Unremarkable  Behavior Characteristics Cooperative;Appropriate to situation  Mood Depressed;Sad  Thought Process  Coherency Blocking  Content WDL  Delusions None reported or observed  Perception WDL  Hallucination None reported or observed  Judgment Impaired  Confusion None  Danger to Self  Current suicidal ideation? Denies  Description of Suicide Plan No plan  Agreement Not to Harm Self Yes  Description of Agreement verbal  Danger to Others  Danger to Others None reported or observed
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
## 2023-12-13 NOTE — Progress Notes (Signed)
(  Sleep Hours) -4.5 (Any PRNs that were needed, meds refused, or side effects to meds)- none (Any disturbances and when (visitation, over night)-none (Concerns raised by the patient)- none (SI/HI/AVH)- denies all

## 2023-12-13 NOTE — Progress Notes (Signed)
   12/13/23 2209  Psych Admission Type (Psych Patients Only)  Admission Status Voluntary  Psychosocial Assessment  Patient Complaints Anxiety;Substance abuse  Eye Contact Fair  Facial Expression Flat  Affect Appropriate to circumstance  Speech Logical/coherent  Interaction Minimal  Motor Activity Other (Comment) (WDL)  Appearance/Hygiene Unremarkable  Behavior Characteristics Appropriate to situation  Mood Depressed  Thought Process  Coherency WDL  Content WDL  Delusions None reported or observed  Perception WDL  Hallucination None reported or observed  Judgment Impaired  Confusion None  Danger to Self  Current suicidal ideation? Denies  Agreement Not to Harm Self Yes  Description of Agreement verbal  Danger to Others  Danger to Others None reported or observed

## 2023-12-13 NOTE — Plan of Care (Signed)
   Problem: Education: Goal: Knowledge of [facility] Education information/materials will improve Outcome: Progressing Goal: Emotional status will improve Outcome: Progressing Goal: Mental status will improve Outcome: Progressing Goal: Verbalization of understanding the information provided will improve Outcome: Progressing

## 2023-12-13 NOTE — Telephone Encounter (Signed)
 Patient currently admitted to hospital.

## 2023-12-13 NOTE — BHH Counselor (Signed)
 CSW place substance use treatment list in patient's room as he was in group. CSW informed patient's RN of treatment list location and informed her that CSW would be available to discuss options on 10/18.  Cavion Faiola, LCSWA 12/13/23 3:53PM
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
## 2023-12-13 NOTE — BH IP Treatment Plan (Signed)
 Interdisciplinary Treatment and Diagnostic Plan Update  12/13/2023 Time of Session: 10:00 AM Evan Wilson MRN: [id number]  Principal Diagnosis: MDD (major depressive disorder), severe (HCC)  Secondary Diagnoses: Principal Problem:   MDD (major depressive disorder), severe (HCC)   Current Medications:  Current Facility-Administered Medications  Medication Dose Route Frequency Provider Last Rate Last Admin   acetaminophen  (TYLENOL ) tablet 650 mg  650 mg Oral Q6H PRN White, Patrice L, NP       alum & mag hydroxide-simeth (MAALOX/MYLANTA) 200-200-20 MG/5ML suspension 30 mL  30 mL Oral Q4H PRN White, Patrice L, NP       baclofen  (LIORESAL ) tablet 10 mg  10 mg Oral TID White, Patrice L, NP   10 mg at 12/13/23 0841   haloperidol  (HALDOL ) tablet 5 mg  5 mg Oral TID PRN White, Patrice L, NP       And   diphenhydrAMINE  (BENADRYL ) capsule 50 mg  50 mg Oral TID PRN White, Patrice L, NP       haloperidol  lactate (HALDOL ) injection 5 mg  5 mg Intramuscular TID PRN White, Patrice L, NP       And   diphenhydrAMINE  (BENADRYL ) injection 50 mg  50 mg Intramuscular TID PRN White, Patrice L, NP       And   LORazepam  (ATIVAN ) injection 2 mg  2 mg Intramuscular TID PRN White, Patrice L, NP       haloperidol  lactate (HALDOL ) injection 10 mg  10 mg Intramuscular TID PRN White, Patrice L, NP       And   diphenhydrAMINE  (BENADRYL ) injection 50 mg  50 mg Intramuscular TID PRN White, Patrice L, NP       And   LORazepam  (ATIVAN ) injection 2 mg  2 mg Intramuscular TID PRN White, Patrice L, NP       gabapentin  (NEURONTIN ) capsule 400 mg  400 mg Oral TID White, Patrice L, NP   400 mg at 12/13/23 0841   hydrOXYzine  (ATARAX ) tablet 25 mg  25 mg Oral Q6H PRN White, Patrice L, NP       loperamide  (IMODIUM ) capsule 2-4 mg  2-4 mg Oral PRN White, Patrice L, NP       LORazepam  (ATIVAN ) tablet 1 mg  1 mg Oral Q6H PRN White, Patrice L, NP       magnesium  hydroxide (MILK OF MAGNESIA) suspension 30 mL  30 mL Oral Daily PRN  White, Patrice L, NP       multivitamin with minerals tablet 1 tablet  1 tablet Oral Daily White, Patrice L, NP   1 tablet at 12/13/23 0841   ondansetron  (ZOFRAN -ODT) disintegrating tablet 4 mg  4 mg Oral Q6H PRN White, Patrice L, NP       PARoxetine  (PAXIL -CR) 24 hr tablet 25 mg  25 mg Oral Daily White, Patrice L, NP   25 mg at 12/13/23 0841   prazosin  (MINIPRESS ) capsule 1 mg  1 mg Oral QHS White, Patrice L, NP       QUEtiapine  (SEROQUEL ) tablet 200 mg  200 mg Oral QHS White, Patrice L, NP       thiamine  (Vitamin B-1) tablet 100 mg  100 mg Oral Daily White, Patrice L, NP   100 mg at 12/13/23 0841   thiamine  (VITAMIN B1) injection 100 mg  100 mg Intramuscular Once White, Patrice L, NP       PTA Medications: Medications Prior to Admission  Medication Sig Dispense Refill Last Dose/Taking   baclofen  (LIORESAL ) 10 MG  tablet Take 1 tablet (10 mg total) by mouth 3 (three) times daily. (Patient taking differently: Take 10 mg by mouth at bedtime.) 90 tablet 1 12/12/2023   gabapentin  (NEURONTIN ) 400 MG capsule Take 1 capsule (400 mg total) by mouth 3 (three) times daily. 90 capsule 1 12/12/2023   hydrochlorothiazide  (HYDRODIURIL ) 12.5 MG tablet Take 1 tablet (12.5 mg total) by mouth daily. 30 tablet 0 Taking   Multiple Vitamin (MULTIVITAMIN WITH MINERALS) TABS tablet Take 1 tablet by mouth daily.   Taking   naltrexone  (DEPADE) 50 MG tablet Take 1 tablet (50 mg total) by mouth daily. 30 tablet 1 12/12/2023   OLANZapine  (ZYPREXA ) 10 MG tablet Take 10 mg by mouth at bedtime.   Taking   prazosin  (MINIPRESS ) 1 MG capsule Take 1 capsule (1 mg total) by mouth at bedtime. 30 capsule 1 12/12/2023   QUEtiapine  (SEROQUEL ) 200 MG tablet Take 1 tablet (200 mg total) by mouth at bedtime. 30 tablet 1 Taking   sertraline  (ZOLOFT ) 100 MG tablet Take 150 mg by mouth daily.   Taking   thiamine  (VITAMIN B1) 100 MG tablet Take 100 mg by mouth daily.   Taking   PARoxetine  (PAXIL -CR) 25 MG 24 hr tablet Take 1 tablet (25 mg  total) by mouth daily. (Patient not taking: Reported on 12/13/2023) 30 tablet 1 Not Taking    Patient Stressors:    Patient Strengths:    Treatment Modalities: Medication Management, Group therapy, Case management,  1 to 1 session with clinician, Psychoeducation, Recreational therapy.   Physician Treatment Plan for Primary Diagnosis: MDD (major depressive disorder), severe (HCC) Long Term Goal(s):     Short Term Goals:    Medication Management: Evaluate patient's response, side effects, and tolerance of medication regimen.  Therapeutic Interventions: 1 to 1 sessions, Unit Group sessions and Medication administration.  Evaluation of Outcomes: Not Progressing  Physician Treatment Plan for Secondary Diagnosis: Principal Problem:   MDD (major depressive disorder), severe (HCC)  Long Term Goal(s):     Short Term Goals:       Medication Management: Evaluate patient's response, side effects, and tolerance of medication regimen.  Therapeutic Interventions: 1 to 1 sessions, Unit Group sessions and Medication administration.  Evaluation of Outcomes: Not Progressing   RN Treatment Plan for Primary Diagnosis: MDD (major depressive disorder), severe (HCC) Long Term Goal(s): Knowledge of disease and therapeutic regimen to maintain health will improve  Short Term Goals: Ability to remain free from injury will improve, Ability to verbalize frustration and anger appropriately will improve, Ability to demonstrate self-control, Ability to participate in decision making will improve, Ability to verbalize feelings will improve, Ability to disclose and discuss suicidal ideas, Ability to identify and develop effective coping behaviors will improve, and Compliance with prescribed medications will improve  Medication Management: RN will administer medications as ordered by provider, will assess and evaluate patient's response and provide education to patient for prescribed medication. RN will report  any adverse and/or side effects to prescribing provider.  Therapeutic Interventions: 1 on 1 counseling sessions, Psychoeducation, Medication administration, Evaluate responses to treatment, Monitor vital signs and CBGs as ordered, Perform/monitor CIWA, COWS, AIMS and Fall Risk screenings as ordered, Perform wound care treatments as ordered.  Evaluation of Outcomes: Not Progressing   LCSW Treatment Plan for Primary Diagnosis: MDD (major depressive disorder), severe (HCC) Long Term Goal(s): Safe transition to appropriate next level of care at discharge, Engage patient in therapeutic group addressing interpersonal concerns.  Short Term Goals: Engage patient in aftercare planning  with referrals and resources, Increase social support, Increase ability to appropriately verbalize feelings, Increase emotional regulation, Facilitate acceptance of mental health diagnosis and concerns, Facilitate patient progression through stages of change regarding substance use diagnoses and concerns, Identify triggers associated with mental health/substance abuse issues, and Increase skills for wellness and recovery  Therapeutic Interventions: Assess for all discharge needs, 1 to 1 time with Social worker, Explore available resources and support systems, Assess for adequacy in community support network, Educate family and significant other(s) on suicide prevention, Complete Psychosocial Assessment, Interpersonal group therapy.  Evaluation of Outcomes: Not Progressing   Progress in Treatment: Attending groups: Yes. Participating in groups: Yes. Taking medication as prescribed: Yes. Toleration medication: Yes. Family/Significant other contact made: No, will contact:  consents are pending Patient understands diagnosis: Yes. Discussing patient identified problems/goals with staff: Yes. Medical problems stabilized or resolved: Yes. Denies suicidal/homicidal ideation: Yes. Issues/concerns per patient self-inventory:  No.  New problem(s) identified:  No  New Short Term/Long Term Goal(s):    medication stabilization, elimination of SI thoughts, development of comprehensive mental wellness plan.    Patient Goals:  I want to stay on my medications.  Discharge Plan or Barriers:  Patient recently admitted. CSW will continue to follow and assess for appropriate referrals and possible discharge planning.    Reason for Continuation of Hospitalization: Anxiety Depression Medication stabilization Suicidal ideation  Estimated Length of Stay:  5 - 7 days  Last 3 [place] Suicide Severity Risk Score: Flowsheet Row Ad[facility] (Current[facility] Most recent reading at 12/13/2023 12:00 AM ED from 12/12/2023 in [facility] Most recent reading at 12/12/2023  5:50 PM ED from 10/31/2023 in [facility] Emergency Department at [facility] Most recent reading at 10/31/2023  1:25 PM  C-SSRS RISK CATEGORY No Risk No Risk No Risk    Last PHQ 2/9 Scores:    10/24/2023    9:48 AM 05/13/2023   10:05 AM 03/06/2023   11:32 AM  Depression screen PHQ 2/9  Decreased Interest 1 2 1   Down, Depressed, Hopeless 2 0 1  PHQ - 2 Score 3 2 2   Altered sleeping 0 0 1  Tired, decreased energy 1 2 0  Change in appetite 0 3 1  Feeling bad or failure about yourself  1 1 1   Trouble concentrating 0 3 1  Moving slowly or fidgety/restless 1 2 2   Suicidal thoughts 1 0 1  PHQ-9 Score 7 13 9   Difficult doing work/chores Somewhat difficult Not difficult at all Somewhat difficult    Scribe for Treatment Team: Katyra Tomassetti O Elwanda Moger, LCSWA 12/13/2023 9:58 AM
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
## 2023-12-13 NOTE — Group Note (Signed)
 Date:  12/13/2023 Time:  4:23 PM  Group Topic/Focus: Identifying resources and support systems Crisis Planning:   The purpose of this group is to help patients create a crisis plan for use upon discharge or in the future, as needed.    Participation Level:  Active  Participation Quality:  Appropriate  Affect:  Appropriate  Cognitive:  Appropriate  Insight: Appropriate  Engagement in Group:  Engaged  Modes of Intervention:  Discussion and Education  Additional Comments:  Pt engaged appropriately in group.  Quintrell Baze D Lydia Meng 12/13/2023, 4:23 PM

## 2023-12-14 NOTE — Group Note (Signed)
 Date:  12/14/2023 Time:  9:01 AM  Group Topic/Focus:  Goals Group:   The focus of this group is to help patients establish daily goals to achieve during treatment and discuss how the patient can incorporate goal setting into their daily lives to aide in recovery. Orientation:   The focus of this group is to educate the patient on the purpose and policies of crisis stabilization and provide a format to answer questions about their admission.  The group details unit policies and expectations of patients while admitted.    Participation Level:  Did Not Attend

## 2023-12-14 NOTE — Group Note (Signed)
 Date:  12/14/2023 Time:  6:34 PM  Group Topic/Focus:  Making Healthy Choices:   The focus of this group is to help patients identify negative/unhealthy choices they were using prior to admission and identify positive/healthier coping strategies to replace them upon discharge. Self Care:   The focus of this group is to help patients understand the importance of self-care in order to improve or restore emotional, physical, spiritual, interpersonal, and financial health. Gut health: Teach about the microbiome of our intestines and how it impacts our physical and mental health. How to help it help us .    Participation Level:  Active  Participation Quality:  Appropriate  Affect:  Appropriate  Cognitive:  Appropriate  Insight: Appropriate  Engagement in Group:  Engaged  Modes of Intervention:  Discussion and Education  Additional Comments:  Appropriate interaction  Evan Wilson 12/14/2023, 6:34 PM

## 2023-12-14 NOTE — Progress Notes (Signed)
(  Sleep Hours) - 9.5 hours (Any PRNs that were needed, meds refused, or side effects to meds)-  Vistaril  given (Any disturbances and when (visitation, over night)- None (Concerns raised by the patient)-  None (SI/HI/AVH)-  Denies

## 2023-12-14 NOTE — Group Note (Signed)
 [facility] LCSW Group Therapy Note  12/14/2023  10:00-11:00AM  Type of Therapy and Topic:  Group Therapy:  Building Supports and Asking for Help  Participation Level:  Minimal   Description of Group:  Patients in this group wished to discuss whether it is a weakness to ask for help.  Processing of this question and related matters took place while group facilitator ensured that all patients had an opportunity to speak, responses were on topic, and the direction of the discussion was positive in nature.    Therapeutic Goals: 1)  discuss why asking for help can be a strength rather than a weakness  2)  explore individual patients' experience in asking for assistance  3)  encourage mutual support among group members  4)  demonstrate the importance of adding supports and of setting boundaries   Summary of Patient Progress:  The patient arrived only for the last 5 minutes of group.  Therapeutic Modalities:   Processing Demonstration  Elgie JINNY Crest, LCSW 12/14/2023 1:15 PM

## 2023-12-14 NOTE — Progress Notes (Signed)
   12/14/23 2230  Psych Admission Type (Psych Patients Only)  Admission Status Voluntary  Psychosocial Assessment  Patient Complaints Anxiety  Eye Contact Fair  Facial Expression Flat  Affect Appropriate to circumstance  Speech Logical/coherent  Interaction Minimal  Motor Activity Other (Comment) (WDL)  Appearance/Hygiene Unremarkable  Behavior Characteristics Appropriate to situation  Mood Depressed  Thought Process  Coherency WDL  Content WDL  Delusions None reported or observed  Perception WDL  Hallucination None reported or observed  Judgment Impaired  Confusion None  Danger to Self  Current suicidal ideation? Denies  Agreement Not to Harm Self Yes  Description of Agreement verbal  Danger to Others  Danger to Others None reported or observed

## 2023-12-14 NOTE — [facility] Suicide Risk Assessment (Signed)
 [facility] INPATIENT:  Family/Significant Other Suicide Prevention Education  Suicide Prevention Education:  Education Completed; Harlene Barge (wife) [phone] , has been identified by the patient as the family member/significant other with whom the patient will be residing, and identified as the person(s) who will aid the patient in the event of a mental health crisis (suicidal ideations/suicide attempt).  With written consent from the patient, the family member/significant other has been provided the following suicide prevention education, prior to the and/or following the discharge of the patient.  The suicide prevention education provided includes the following: Suicide risk factors Suicide prevention and interventions National Suicide Hotline telephone number [facility] assessment telephone number [facility] Emergency Assistance 911 [facility] and/or Residential Mobile Crisis Unit telephone number  Request made of family/significant other to: Remove weapons (e.g., guns, rifles, knives), all items previously/currently identified as safety concern.   Remove drugs/medications (over-the-counter, prescriptions, illicit drugs), all items previously/currently identified as a safety concern.  The family member/significant other verbalizes understanding of the suicide prevention education information provided.  The family member/significant other agrees to remove the items of safety concern listed above.  Roselyn GORMAN Lento 12/14/2023, 9:41 AM

## 2023-12-14 NOTE — Progress Notes (Signed)
 Adult Psychoeducational Group Note  Date:  12/14/2023 Time:  9:18 PM  Group Topic/Focus:  Goals Group:   The focus of this group is to help patients establish daily goals to achieve during treatment and discuss how the patient can incorporate goal setting into their daily lives to aide in recovery.  Participation Level:  Active  Participation Quality:  Appropriate  Affect:  Appropriate  Cognitive:  Appropriate  Insight: Appropriate  Engagement in Group:  Engaged  Modes of Intervention:  Discussion  Additional Comments:  Pt stated his goal for the day was to attend most groups.  Pt met goal.  Daine Lonita BIRCH 12/14/2023, 9:18 PM
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
## 2023-12-14 NOTE — Plan of Care (Signed)
   Problem: Education: Goal: Knowledge of [facility] Education information/materials will improve Outcome: Progressing   Problem: Education: Goal: Verbalization of understanding the information provided will improve Outcome: Progressing

## 2023-12-14 NOTE — Progress Notes (Addendum)
 D. Pt presents with sad affect, depressed mood, minimal during interactions. Pt stayed in bed for much of the morning, but observed attending group later  Pt currently denies SI/HI and AVH and doesn't appear to be responding to internal stimuli. A. Labs and vitals monitored. Pt given and educated on medications. Pt supported emotionally and encouraged to express concerns and ask questions.   R. Pt remains safe with 15 minute checks. Will continue POC.    12/14/23 1300  Psych Admission Type (Psych Patients Only)  Admission Status Voluntary  Psychosocial Assessment  Patient Complaints Anxiety  Eye Contact Brief  Facial Expression Sad  Affect Depressed  Speech Soft;Logical/coherent  Interaction Forwards little;Minimal  Motor Activity Slow  Appearance/Hygiene Unremarkable  Behavior Characteristics Cooperative;Appropriate to situation  Mood Depressed  Thought Process  Coherency WDL  Content WDL  Delusions None reported or observed  Perception WDL  Hallucination None reported or observed  Judgment Impaired  Confusion None  Danger to Self  Current suicidal ideation? Denies  Danger to Others  Danger to Others None reported or observed

## 2023-12-14 NOTE — Plan of Care (Signed)
 Pt was out in the milieu half the day acting appropriately. Went to [employer] and ate adequately. Attending group activity and participated. Denies SI/HI/SH/paranoia/AVH. Will continue to monitor.

## 2023-12-15 NOTE — Plan of Care (Signed)
   Problem: Education: Goal: Emotional status will improve Outcome: Progressing Goal: Mental status will improve Outcome: Progressing

## 2023-12-15 NOTE — Group Note (Signed)
 Date:  12/15/2023 Time:  12:58 PM  Group Topic/Focus:  Emotional Education:  The group explored themes of acceptance, change, resilience, and control through song analysis and a lyric substitution activity. The goal of the group is to increase self awareness, emotional expression, autonomy, and self-esteem.   Participation Level:  Minimal  Participation Quality:  Appropriate  Affect:  Appropriate  Cognitive:  Appropriate  Insight: Appropriate  Engagement in Group:  Developing/Improving  Modes of Intervention:  Activity and Discussion  Additional Comments:    Evan Wilson Evan Wilson 12/15/2023, 12:58 PM

## 2023-12-15 NOTE — Group Note (Signed)
 Date:  12/15/2023 Time:  9:08 PM  Group Topic/Focus:  Wrap-Up Group:   The focus of this group is to help patients review their daily goal of treatment and discuss progress on daily workbooks.    Participation Level:  Active  Participation Quality:  Appropriate  Affect:  Appropriate  Cognitive:  Appropriate  Insight: Appropriate  Engagement in Group:  Engaged  Modes of Intervention:  Discussion  Additional Comments:   Pt endorsed anxiety at a 4. Pt states that he wants ti get better at remaining sober  Cristel Rail A Ansleigh Safer 12/15/2023, 9:08 PM

## 2023-12-15 NOTE — Group Note (Signed)
 Date:  12/15/2023 Time:  6:42 PM  Group Topic/Focus:  Making Healthy Choices:   The focus of this group is to help patients identify negative/unhealthy choices they were using prior to admission and identify positive/healthier coping strategies to replace them upon discharge. Relapse Prevention Planning:   The focus of this group is to define relapse and discuss the need for planning to combat relapse.  Watched video of addiction behavior and it's triggers and possible solutions. Gave handouts with description of video and thought provoking questons. Personal sharing and audience sharing.   Participation Level:  Did Not Attend  [name] 12/15/2023, 6:42 PM

## 2023-12-15 NOTE — Progress Notes (Addendum)
 D. Pt continues to present flat/ depressed, has been visible at times in the milieu. Pt reported that his sleep was 'fair', described his concentration and appetite as 'good', and energy level as 'normal'. Per pt's self inventory, pt rated his depression, hopelessness and anxiety a 3/2/3, respectively. Pt's stated goal today is to stay focused in attending group meeting. Pt currently denies SI/HI and AVH and doesn't appear to be responding to internal stimuli A. Labs and vitals monitored. Pt given and educated on medications. Pt supported emotionally and encouraged to express concerns and ask questions.   R. Pt remains safe with 15 minute checks. Will continue POC.    12/15/23 1400  Psych Admission Type (Psych Patients Only)  Admission Status Voluntary  Psychosocial Assessment  Patient Complaints Depression  Eye Contact Fair  Facial Expression Flat  Affect Appropriate to circumstance  Speech Logical/coherent  Interaction Minimal  Motor Activity Slow  Appearance/Hygiene Unremarkable  Behavior Characteristics Cooperative;Appropriate to situation  Mood Depressed  Thought Process  Coherency WDL  Content WDL  Delusions None reported or observed  Perception WDL  Hallucination None reported or observed  Judgment Impaired  Confusion None  Danger to Self  Current suicidal ideation? Denies  Agreement Not to Harm Self Yes  Description of Agreement agreed to contact staff before acting on harmful thoughts  Danger to Others  Danger to Others None reported or observed

## 2023-12-15 NOTE — Group Note (Signed)
 Date:  12/15/2023 Time:  9:54 AM  Group Topic/Focus:  Goals Group:   The focus of this group is to help patients establish daily goals to achieve during treatment and discuss how the patient can incorporate goal setting into their daily lives to aide in recovery. Orientation:   The focus of this group is to educate the patient on the purpose and policies of crisis stabilization and provide a format to answer questions about their admission.  The group details unit policies and expectations of patients while admitted.    Participation Level:  Did Not Attend   Evan Wilson 12/15/2023, 9:54 AM

## 2023-12-15 NOTE — Progress Notes (Signed)
   12/15/23 2233  Psych Admission Type (Psych Patients Only)  Admission Status Voluntary  Psychosocial Assessment  Patient Complaints Depression  Eye Contact Fair  Facial Expression Flat  Affect Appropriate to circumstance  Speech Logical/coherent  Interaction Minimal  Motor Activity Other (Comment) (WDL)  Appearance/Hygiene Unremarkable  Behavior Characteristics Appropriate to situation  Mood Depressed  Thought Process  Coherency WDL  Content WDL  Delusions None reported or observed  Perception WDL  Hallucination None reported or observed  Judgment Impaired  Confusion None  Danger to Self  Current suicidal ideation? Denies  Agreement Not to Harm Self Yes  Description of Agreement verbal  Danger to Others  Danger to Others None reported or observed
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
## 2023-12-15 NOTE — Progress Notes (Signed)
(  Sleep Hours) - (Any PRNs that were needed, meds refused, or side effects to meds)-  Vistaril  given (Any disturbances and when (visitation, over night)- None (Concerns raised by the patient)-  None (SI/HI/AVH)-  denies

## 2023-12-15 NOTE — Plan of Care (Signed)
   Problem: Education: Goal: Verbalization of understanding the information provided will improve Outcome: Progressing   Problem: Activity: Goal: Interest or engagement in activities will improve Outcome: Progressing

## 2023-12-16 NOTE — Group Note (Signed)
 Date:  12/16/2023 Time:  8:16 PM  Group Topic/Focus:  Wrap-Up Group:   The focus of this group is to help patients review their daily goal of treatment and discuss progress on daily workbooks.    Participation Level:  Active  Participation Quality:  Appropriate  Affect:  Appropriate  Cognitive:  Appropriate  Insight: Appropriate  Engagement in Group:  Engaged  Modes of Intervention:  Discussion  Additional Comments:   Pt attended AA meeting  Rosalind Guido A Mickayla Trouten 12/16/2023, 8:16 PM

## 2023-12-16 NOTE — BHH Counselor (Signed)
 CSW spoke with patient regarding interest in IOP/Residential treatment for alcohol  consumption. Patient agreed to go to [facility] if he didn't get into a residential treatment program and agreed to have referral sent over for [name]. CSW team to fax referral for residential treatment.   Brenson Hartman, LCSWA 12/16/23 4:14PM

## 2023-12-16 NOTE — Group Note (Signed)
 Recreation Therapy Group Note   Group Topic:Problem Solving  Group Date: 12/16/2023 Start Time: 0941 End Time: 1002 Facilitators: Alexza Norbeck-McCall, LRT,CTRS Location: [address] Dayroom   Group Topic: Communication, Team Building, Problem Solving  Goal Area(s) Addresses:  Patient will effectively work with peer towards shared goal.  Patient will identify skills used to make activity successful.  Patient will identify how skills used during activity can be used to reach post d/c goals.   Behavioral Response:   Intervention: STEM Activity  Activity: Straw Bridge. In teams of 3-5, patients were given 15 plastic drinking straws and an equal length of masking tape. Using the materials provided, patients were instructed to build a free standing bridge-like structure to suspend an everyday item (ex: puzzle box) off of the floor or table surface. All materials were required to be used by the team in their design. LRT facilitated post-activity discussion reviewing team process. Patients were encouraged to reflect how the skills used in this activity can be generalized to daily life post discharge.   Education: Pharmacist, community, Scientist, physiological, Discharge Planning   Education Outcome: Acknowledges education/In group clarification offered/Needs additional education.    Affect/Mood: N/A   Participation Level: Did not attend    Clinical Observations/Individualized Feedback:     Plan: Continue to engage patient in RT group sessions 2-3x/week.   Taite Schoeppner-McCall, LRT,CTRS 12/16/2023 10:42 AM

## 2023-12-16 NOTE — Progress Notes (Addendum)
 Patient denies SI/HI/AVH this morning. Pt reports that he slept good last night. Pt reports that his last bowl movement was this morning. Pt presents with a flat affect. Pt reports chronic lower back pain, rating it a 6/10.  Pt has been participating in some groups on the unit. Pt has been calm and cooperative throughout the day. Patient has been compliant with medications and treatment plan. Q 15 minute safety checks are in place for patient's safety. Patient is currently safe on the unit.   12/16/23 0841  Psych Admission Type (Psych Patients Only)  Admission Status Voluntary  Psychosocial Assessment  Patient Complaints Depression  Eye Contact Fair  Facial Expression Flat  Affect Flat  Speech Logical/coherent  Interaction Minimal  Motor Activity Slow  Appearance/Hygiene Unremarkable  Behavior Characteristics Cooperative  Mood Depressed  Thought Process  Coherency WDL  Content WDL  Delusions None reported or observed  Perception WDL  Hallucination None reported or observed  Judgment Impaired  Confusion None  Danger to Self  Current suicidal ideation? Denies  Description of Suicide Plan No plan verbalized  Agreement Not to Harm Self Yes  Description of Agreement Pt verbally contracts for safety  Danger to Others  Danger to Others None reported or observed

## 2023-12-16 NOTE — Group Note (Signed)
 Date:  12/16/2023 Time:  3:37 PM  Group Topic/Focus: Occupational Therapy Group  Occupational therapy    Participation Level:  Active  Participation Quality:  Appropriate  Affect:  Appropriate  Cognitive:  Appropriate  Insight: Appropriate  Engagement in Group:  Engaged  Modes of Intervention:  Discussion  Additional Comments:  Patient attended group.   Antavious Spanos D Frankee Gritz 12/16/2023, 3:37 PM

## 2023-12-16 NOTE — Progress Notes (Deleted)
(  Sleep Hours) - (Any PRNs that were needed, meds refused, or side effects to meds)-  Vistaril  given (Any disturbances and when (visitation ) None (SI/HI/AVH)-  Denies

## 2023-12-16 NOTE — Plan of Care (Signed)
   Problem: Education: Goal: Knowledge of [facility] Education information/materials will improve Outcome: Progressing Goal: Emotional status will improve Outcome: Progressing Goal: Mental status will improve Outcome: Progressing Goal: Verbalization of understanding the information provided will improve Outcome: Progressing

## 2023-12-16 NOTE — Group Note (Signed)
 Occupational Therapy Group Note  Group Topic: Sleep Hygiene  Group Date: 12/16/2023 Start Time: 1500 End Time: 1538 Facilitators: Dot [place] MATSU, OT   Group Description: Group encouraged increased participation and engagement through topic focused on sleep hygiene. Patients reflected on the quality of sleep they typically receive and identified areas that need improvement. Group was given background information on sleep and sleep hygiene, including common sleep disorders. Group members also received information on how to improve one's sleep and introduced a sleep diary as a tool that can be utilized to track sleep quality over a length of time. Group session ended with patients identifying one or more strategies they could utilize or implement into their sleep routine in order to improve overall sleep quality.        Therapeutic Goal(s):  Identify one or more strategies to improve overall sleep hygiene  Identify one or more areas of sleep that are negatively impacted (sleep too much, too little, etc)     Participation Level: Engaged   Participation Quality: Independent   Behavior: Appropriate   Speech/Thought Process: Relevant   Affect/Mood: Appropriate   Insight: Fair   Judgement: Fair      Modes of Intervention: Education  Patient Response to Interventions:  Attentive   Plan: Continue to engage patient in OT groups 2 - 3x/week.  12/16/2023  [place] MATSU Dot, OT   Amber Guthridge, OT

## 2023-12-16 NOTE — Progress Notes (Signed)
(  Sleep Hours) - 7.25 hours (Any PRNs that were needed, meds refused, or side effects to meds)-  Vistaril  given (Any disturbances and when (visitation, over night)- None (Concerns raised by the patient)-  None (SI/HI/AVH)-  Denies

## 2023-12-16 NOTE — Group Note (Signed)
 Date:  12/16/2023 Time:  10:14 AM  Group Topic/Focus:  Goals Group:   The focus of this group is to help patients establish daily goals to achieve during treatment and discuss how the patient can incorporate goal setting into their daily lives to aide in recovery.    Participation Level:  Did Not Attend  [name] 12/16/2023, 10:14 AM

## 2023-12-16 NOTE — Progress Notes (Signed)
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
## 2023-12-16 NOTE — Group Note (Signed)
 Date:  12/16/2023 Time:  11:12 AM  Group Topic/Focus:  Wellness    Participation Level:  Did Not Attend  Evan Wilson 12/16/2023, 11:12 AM

## 2023-12-16 NOTE — Group Note (Signed)
 Date:  12/16/2023 Time:  1:27 PM  Group Topic/Focus:  [facility] Group    Participation Level:  Did Not Attend  Evan Wilson 12/16/2023, 1:27 PM

## 2023-12-16 NOTE — Group Note (Deleted)
 Date:  12/16/2023 Time:  8:08 PM  Group Topic/Focus:        Participation Level:  {BHH PARTICIPATION OZCZO:77735}  Participation Quality:  {BHH PARTICIPATION QUALITY:22265}  Affect:  {BHH AFFECT:22266}  Cognitive:  {BHH COGNITIVE:22267}  Insight: {BHH Insight2:20797}  Engagement in Group:  {BHH ENGAGEMENT IN HMNLE:77731}  Modes of Intervention:  {BHH MODES OF INTERVENTION:22269}  Additional Comments:  ***  Evan Wilson 12/16/2023, 8:08 PM

## 2023-12-17 MED ORDER — LIDOCAINE 5 % EX PTCH
1.0000 | MEDICATED_PATCH | CUTANEOUS | Status: DC
  Administered 2023-12-17 – 2023-12-19 (×3): 1 via TRANSDERMAL
  Filled 2023-12-17 (×3): qty 1

## 2023-12-17 NOTE — Progress Notes (Signed)
   12/17/23 0000  Psych Admission Type (Psych Patients Only)  Admission Status Voluntary  Psychosocial Assessment  Patient Complaints Depression  Eye Contact Fair  Facial Expression Flat  Affect Flat  Speech Logical/coherent  Interaction Minimal  Motor Activity Slow  Appearance/Hygiene Unremarkable  Behavior Characteristics Cooperative  Mood Depressed  Thought Process  Coherency WDL  Content WDL  Delusions None reported or observed  Perception WDL  Hallucination None reported or observed  Judgment Impaired  Confusion None  Danger to Self  Current suicidal ideation? Denies  Agreement Not to Harm Self Yes  Description of Agreement Verbal  Danger to Others  Danger to Others None reported or observed

## 2023-12-17 NOTE — Progress Notes (Signed)
 Patient denies SI, AH, VH. Patient stated they slept good last night. Scored zero on anxiety and depression. Patient has been calm, cooperative, and med compliant.    Mercie VEAR Banana, RN 9:07 AM    12/17/23 0800  Psych Admission Type (Psych Patients Only)  Admission Status Voluntary  Psychosocial Assessment  Patient Complaints None  Eye Contact Fair  Facial Expression Flat  Affect Flat  Speech Logical/coherent  Interaction Minimal  Motor Activity Slow  Appearance/Hygiene Unremarkable  Behavior Characteristics Cooperative  Mood Depressed  Thought Process  Coherency WDL  Content WDL  Delusions None reported or observed  Perception WDL  Hallucination None reported or observed  Judgment Impaired  Confusion None  Danger to Self  Current suicidal ideation? Denies  Agreement Not to Harm Self Yes  Description of Agreement Verbal  Danger to Others  Danger to Others None reported or observed

## 2023-12-17 NOTE — Plan of Care (Signed)
   Problem: Education: Goal: Knowledge of [place] [facility] Education information/materials will improve Outcome: Progressing Goal: Emotional status will improve Outcome: Progressing Goal: Mental status will improve Outcome: Progressing

## 2023-12-17 NOTE — Group Note (Signed)
 Recreation Therapy Group Note   Group Topic:Animal Assisted Therapy   Group Date: 12/17/2023 Start Time: 9052 End Time: 1030 Facilitators: Shantee Hayne-McCall, LRT,CTRS Location: [address] Dayroom   Animal-Assisted Activity (AAA) Program Checklist/Progress Notes Patient Eligibility Criteria Checklist & Daily Group note for Rec Tx Intervention  AAA/T Program Assumption of Risk Form signed by Patient/ or Parent Legal Guardian Yes  Patient understands his/her participation is voluntary Yes  Behavioral Response:    Education: Charity fundraiser, Appropriate Animal Interaction   Education Outcome: Acknowledges education.    Affect/Mood: N/A   Participation Level: Did not attend    Clinical Observations/Individualized Feedback:      Plan: Continue to engage patient in RT group sessions 2-3x/week.   Evan Wilson, LRT,CTRS  12/17/2023 12:25 PM
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
## 2023-12-17 NOTE — Progress Notes (Signed)
 CSW team faxed referral to [facility] per patient request for residential substance use treatment. Patient to call on 10/22 regarding phone screening.  Mamoudou Mulvehill, LCSWA 12/17/23 2:06PM

## 2023-12-17 NOTE — Group Note (Signed)
 Date:  12/17/2023 Time:  4:17 PM  Group Topic/Focus:  Developing a Wellness Toolbox:   The focus of this group is to help patients develop a wellness toolbox with skills and strategies to promote recovery upon discharge.    Participation Level:  Active  Participation Quality:  Appropriate  Affect:  Appropriate  Cognitive:  Appropriate  Insight: Appropriate  Engagement in Group:  Engaged  Modes of Intervention:  Discussion   Annalee  Shyleigh Daughtry 12/17/2023, 4:17 PM

## 2023-12-17 NOTE — Group Note (Signed)
 Date:  12/17/2023 Time:  11:25 PM  Group Topic/Focus:  Wrap-Up Group:   The focus of this group is to help patients review their daily goal of treatment and discuss progress on daily workbooks.    Participation Level:  Active  Participation Quality:  Appropriate and Sharing  Affect:  Appropriate and Flat  Cognitive:  Appropriate  Insight: Appropriate  Engagement in Group:  Engaged  Modes of Intervention:  Activity and Socialization  Additional Comments:  Patients shared one high point and one low point from their day. Patient rated his day a 5 out of 10. Patient high point, has a screening for [name] tomorrow. Patient low point, experiencing some back pain.   Eward Mace 12/17/2023, 11:25 PM

## 2023-12-17 NOTE — Group Note (Signed)
 Date:  12/17/2023 Time:  11:34 AM  Group Topic/Focus:  Pet Therapy:   This group aims to reduce stress and anxiety, improve mood, and increase feelings of comfort using animals.   Participation Level:  Did Not Attend   Evan Wilson Mars 12/17/2023, 11:34 AM

## 2023-12-17 NOTE — Plan of Care (Signed)
   Problem: Education: Goal: Emotional status will improve Outcome: Progressing Goal: Mental status will improve Outcome: Progressing   Problem: Activity: Goal: Interest or engagement in activities will improve Outcome: Progressing

## 2023-12-17 NOTE — Group Note (Signed)
 Date:  12/17/2023 Time:  10:13 AM  Group Topic/Focus:  Goals Group:   The focus of this group is to help patients establish daily goals to achieve during treatment and discuss how the patient can incorporate goal setting into their daily lives to aide in recovery. Orientation:   The focus of this group is to educate the patient on the purpose and policies of crisis stabilization and provide a format to answer questions about their admission.  The group details unit policies and expectations of patients while admitted.    Participation Level:  Did Not Attend    Evan Wilson 12/17/2023, 10:13 AM

## 2023-12-17 NOTE — Progress Notes (Signed)
(  Sleep Hours) - 8.25 (Any PRNs that were needed, meds refused, or side effects to meds)- n/a (Any disturbances and when (visitation, over night)- n/a (Concerns raised by the patient)- n/a  (SI/HI/AVH)- denies

## 2023-12-17 NOTE — Group Note (Signed)
 Date:  12/17/2023 Time:  3:47 PM  Group Topic/Focus:  [facility] [facility] Group:   The purpose of this group is to discuss setting SMART goals and creating a WRAP plan.   Participation Level:  Did Not Attend   Evan Wilson 12/17/2023, 3:47 PM

## 2023-12-17 NOTE — Group Note (Signed)
 LCSW Group Therapy Note   Group Date: 12/17/2023 Start Time: 1300 End Time: 1350   Type of Therapy and Topic:  Group Therapy: Building Healthy Relationships  Participation Level:  Active  Description of Group: This group will address the use of boundaries in their personal lives. Patients will explore why boundaries are important, the difference between healthy and unhealthy boundaries, and negative and postive outcomes of different boundaries and will look at how boundaries can be crossed.  Patients will be encouraged to identify current boundaries in their own lives and identify what kind of boundary is being set. Facilitators will guide patients in utilizing problem-solving interventions to address and correct types boundaries being used and to address when no boundary is being used. Understanding and applying boundaries will be explored and addressed for obtaining and maintaining a balanced life. Patients will be encouraged to explore ways to assertively make their boundaries and needs known to significant others in their lives, using other group members and facilitator for role play, support, and feedback. Objective:  To explore loneliness, boundaries, and safe ways to build relationships. Participants discussed loneliness, healthy connections, and setting boundaries. They explored safe ways to meet people and shared personal experiences. Key insights were reinforced through discussion and quotes.   Goals: Recognize healthy vs. unhealthy relationships. Learn safe ways to connect with others. Strengthen communication and [employer].    Therapeutic Modalities Used: Cognitive Behavioral Therapy (CBT) Elements - Identifying unhealthy relationship patterns, challenging negative thoughts about connection. Dialectical Behavior Therapy (DBT) Elements - Interpersonal effectiveness, setting and maintaining boundaries. Supportive Group Therapy - Peer discussion, shared experiences, and  emotional validation.  Summary of Patient Progress:  Jihaad was appropriate and present/active throughout the session and proved open to feedback from CSW and peers. Patient demonstrated some insight into the subject matter, was respectful of peers, and was present throughout the entire session.  Louetta Lame, LCSWA 12/17/2023  2:56 PM

## 2023-12-18 ENCOUNTER — Encounter (HOSPITAL_COMMUNITY): Payer: Self-pay

## 2023-12-18 NOTE — Group Note (Signed)
 Date:  12/18/2023 Time:  5:06 PM  Group Topic/Focus:  Coping With Mental Health Crisis:   The purpose of this group is to help patients identify strategies for coping with mental health crisis.  Group discusses possible causes of crisis and ways to manage them effectively. Developing a Wellness Toolbox:   The focus of this group is to help patients develop a wellness toolbox with skills and strategies to promote recovery upon discharge.    Participation Level:  Active  Participation Quality:  Appropriate  Affect:  Appropriate  Cognitive:  Appropriate  Insight: Appropriate  Engagement in Group:  Engaged  Modes of Intervention:  Discussion  Additional Comments:    Eleanor JAYSON Metro 12/18/2023, 5:06 PM

## 2023-12-18 NOTE — Group Note (Signed)
 Date:  12/18/2023 Time:  9:23 AM  Group Topic/Focus:  Goals Group:   The focus of this group is to help patients establish daily goals to achieve during treatment and discuss how the patient can incorporate goal setting into their daily lives to aide in recovery. Orientation:   The focus of this group is to educate the patient on the purpose and policies of crisis stabilization and provide a format to answer questions about their admission.  The group details unit policies and expectations of patients while admitted.  To create a supportive and motivating environment where members can clarify and set meaningful personal and professional goals, understand how fulfilling foundational needs (as outlined in Maslow's hierarchy) supports their growth, utilize positive affirmations to build self-confidence and resilience, and foster continuous personal development and self-actualization.  Participation Level:  Did Not Attend   Evan Wilson 12/18/2023, 9:23 AM

## 2023-12-18 NOTE — Progress Notes (Signed)

## 2023-12-18 NOTE — Progress Notes (Signed)
(  Sleep Hours) - 8.75 (Any PRNs that were needed, meds refused, or side effects to meds)- none (Any disturbances and when (visitation, over night)- none (Concerns raised by the patient)- none  (SI/HI/AVH)- denies

## 2023-12-18 NOTE — Group Note (Signed)
 Recreation Therapy Group Note   Group Topic:Leisure Education  Group Date: 12/18/2023 Start Time: 0933 End Time: 1020 Facilitators: Feliciano Wynter-McCall, LRT,CTRS Location: [address] Dayroom   Group Topic: Leisure Education  Goal Area(s) Addresses:  Patient will identify positive leisure activities for use post discharge. Patient will identify at least one positive benefit of participation in leisure activities.  Patient will work effectively work with peer by sharing ideas and contributing to Social worker.  Behavioral Response: Observant   Intervention: Innovation, Group Presentation   Activity: In pairs, patients were asked to create a community based program with their teammate. Team's were tasked with designing a program, including a Name, Demographic, Age group, Benefits, Hours of operation and What is offered.  Education:  Leisure Scientist, physiological, Special educational needs teacher, Teamwork, Discharge Planning  Education Outcome: Acknowledges education/In group clarification offered/Needs additional education.    Affect/Mood: Appropriate   Participation Level: None   Participation Quality: None   Behavior: On-looking   Speech/Thought Process: None   Insight: None   Judgement: None   Modes of Intervention: Art   Patient Response to Interventions:  Disengaged   Education Outcome:  In group clarification offered    Clinical Observations/Individualized Feedback: Pt didn't participate in group. Pt watched and observed as peers worked to complete their brochures.     Plan: Continue to engage patient in RT group sessions 2-3x/week.   Jalana Moore-McCall, LRT,CTRS 12/18/2023 11:55 AM

## 2023-12-18 NOTE — Group Note (Signed)
 Date:  12/18/2023 Time:  10:37 AM  Group Topic/Focus: Recreation Therapy   Pt did attend recreation therapy group.   Levi Klaiber R Davier Tramell 12/18/2023, 10:37 AM
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
## 2023-12-18 NOTE — Plan of Care (Signed)
   Problem: Education: Goal: Emotional status will improve Outcome: Progressing Goal: Mental status will improve Outcome: Progressing Goal: Verbalization of understanding the information provided will improve Outcome: Progressing

## 2023-12-18 NOTE — Group Note (Signed)
 Date:  12/18/2023 Time:  8:13 PM  Group Topic/Focus:  Wrap-Up Group:   The focus of this group is to help patients review their daily goal of treatment and discuss progress on daily workbooks.    Participation Level:  Did Not Attend  Participation Quality:  none  Affect:  none  Cognitive:  none  Insight: None  Engagement in Group:  None  Modes of Intervention:  Discussion  Additional Comments:   Pt did not attend NA meeting  Trenell Moxey A Bellamy Judson 12/18/2023, 8:13 PM

## 2023-12-18 NOTE — Progress Notes (Signed)
 CSW provided patient with phone number for [name] and encouraged him to call after group to complete phone screening as discussed yesterday. Patient reported he would call after group.  Samreet Edenfield, LCSWA 12/18/23 10:11AM

## 2023-12-18 NOTE — Group Note (Signed)
 Date:  12/18/2023 Time:  3:57 PM  Group Topic/Focus: Spiritual Wellness  Pt did not attend spiritual wellness group   Evan Wilson 12/18/2023, 3:57 PM

## 2023-12-18 NOTE — Plan of Care (Signed)
   Problem: Education: Goal: Knowledge of [facility] Education information/materials will improve Outcome: Progressing Goal: Emotional status will improve Outcome: Progressing Goal: Mental status will improve Outcome: Progressing Goal: Verbalization of understanding the information provided will improve Outcome: Progressing

## 2023-12-18 NOTE — Progress Notes (Signed)
   12/18/23 0814  Psych Admission Type (Psych Patients Only)  Admission Status Voluntary  Psychosocial Assessment  Patient Complaints None  Eye Contact Fair  Facial Expression Flat  Affect Flat  Speech Logical/coherent  Interaction Minimal  Motor Activity Other (Comment) (WDL)  Appearance/Hygiene Unremarkable  Behavior Characteristics Cooperative  Mood Depressed  Thought Process  Coherency WDL  Content WDL  Delusions None reported or observed  Perception WDL  Hallucination None reported or observed  Judgment Impaired  Confusion None  Danger to Self  Current suicidal ideation? Denies  Agreement Not to Harm Self Yes  Description of Agreement Verbal  Danger to Others  Danger to Others None reported or observed

## 2023-12-18 NOTE — BH IP Treatment Plan (Signed)
 Interdisciplinary Treatment and Diagnostic Plan Update  12/18/2023 Time of Session: 12:00 PM - UPDATE Evan Wilson MRN: [id number]  Principal Diagnosis: MDD (major depressive disorder), severe (HCC)  Secondary Diagnoses: Principal Problem:   MDD (major depressive disorder), severe (HCC) Active Problems:   PTSD (post-traumatic stress disorder)   Nightmares associated with chronic post-traumatic stress disorder   Severe alcohol  use disorder (HCC)   GAD (generalized anxiety disorder)   Current Medications:  Current Facility-Administered Medications  Medication Dose Route Frequency Provider Last Rate Last Admin   acetaminophen  (TYLENOL ) tablet 1,000 mg  1,000 mg Oral TID McCarty, Artie, MD   1,000 mg at 12/18/23 0814   alum & mag hydroxide-simeth (MAALOX/MYLANTA) 200-200-20 MG/5ML suspension 30 mL  30 mL Oral Q4H PRN White, Patrice L, NP       baclofen  (LIORESAL ) tablet 10 mg  10 mg Oral TID White, Patrice L, NP   10 mg at 12/18/23 9185   haloperidol  (HALDOL ) tablet 5 mg  5 mg Oral TID PRN White, Patrice L, NP       And   diphenhydrAMINE  (BENADRYL ) capsule 50 mg  50 mg Oral TID PRN White, Patrice L, NP       haloperidol  lactate (HALDOL ) injection 5 mg  5 mg Intramuscular TID PRN White, Patrice L, NP       And   diphenhydrAMINE  (BENADRYL ) injection 50 mg  50 mg Intramuscular TID PRN White, Patrice L, NP       And   LORazepam  (ATIVAN ) injection 2 mg  2 mg Intramuscular TID PRN White, Patrice L, NP       haloperidol  lactate (HALDOL ) injection 10 mg  10 mg Intramuscular TID PRN White, Patrice L, NP       And   diphenhydrAMINE  (BENADRYL ) injection 50 mg  50 mg Intramuscular TID PRN White, Patrice L, NP       And   LORazepam  (ATIVAN ) injection 2 mg  2 mg Intramuscular TID PRN White, Patrice L, NP       gabapentin  (NEURONTIN ) capsule 400 mg  400 mg Oral TID White, Patrice L, NP   400 mg at 12/18/23 9185   lidocaine  (LIDODERM ) 5 % 1 patch  1 patch Transdermal Q24H Bennett, Christal H, NP    1 patch at 12/17/23 1501   magnesium  hydroxide (MILK OF MAGNESIA) suspension 30 mL  30 mL Oral Daily PRN White, Patrice L, NP       multivitamin with minerals tablet 1 tablet  1 tablet Oral Daily White, Patrice L, NP   1 tablet at 12/18/23 0814   naltrexone  (DEPADE) tablet 50 mg  50 mg Oral Daily McCarty, Artie, MD   50 mg at 12/18/23 9185   PARoxetine  (PAXIL -CR) 24 hr tablet 37.5 mg  37.5 mg Oral Daily McCarty, Artie, MD   37.5 mg at 12/18/23 9185   prazosin  (MINIPRESS ) capsule 1 mg  1 mg Oral QHS White, Patrice L, NP   1 mg at 12/17/23 2055   QUEtiapine  (SEROQUEL ) tablet 200 mg  200 mg Oral QHS White, Patrice L, NP   200 mg at 12/17/23 2057   thiamine  (Vitamin B-1) tablet 100 mg  100 mg Oral Daily White, Patrice L, NP   100 mg at 12/18/23 9185   PTA Medications: Medications Prior to Admission  Medication Sig Dispense Refill Last Dose/Taking   baclofen  (LIORESAL ) 10 MG tablet Take 1 tablet (10 mg total) by mouth 3 (three) times daily. (Patient taking differently: Take 10 mg by mouth  at bedtime.) 90 tablet 1 12/12/2023   gabapentin  (NEURONTIN ) 400 MG capsule Take 1 capsule (400 mg total) by mouth 3 (three) times daily. 90 capsule 1 12/12/2023   hydrochlorothiazide  (HYDRODIURIL ) 12.5 MG tablet Take 1 tablet (12.5 mg total) by mouth daily. 30 tablet 0 Taking   Multiple Vitamin (MULTIVITAMIN WITH MINERALS) TABS tablet Take 1 tablet by mouth daily.   Taking   naltrexone  (DEPADE) 50 MG tablet Take 1 tablet (50 mg total) by mouth daily. 30 tablet 1 12/12/2023   prazosin  (MINIPRESS ) 1 MG capsule Take 1 capsule (1 mg total) by mouth at bedtime. 30 capsule 1 12/12/2023   QUEtiapine  (SEROQUEL ) 200 MG tablet Take 1 tablet (200 mg total) by mouth at bedtime. 30 tablet 1 Taking   thiamine  (VITAMIN B1) 100 MG tablet Take 100 mg by mouth daily.   Taking   PARoxetine  (PAXIL -CR) 25 MG 24 hr tablet Take 1 tablet (25 mg total) by mouth daily. (Patient not taking: Reported on 12/13/2023) 30 tablet 1 Not Taking     Patient Stressors:    Patient Strengths:    Treatment Modalities: Medication Management, Group therapy, Case management,  1 to 1 session with clinician, Psychoeducation, Recreational therapy.   Physician Treatment Plan for Primary Diagnosis: MDD (major depressive disorder), severe (HCC) Long Term Goal(s):     Short Term Goals:    Medication Management: Evaluate patient's response, side effects, and tolerance of medication regimen.  Therapeutic Interventions: 1 to 1 sessions, Unit Group sessions and Medication administration.  Evaluation of Outcomes: Progressing  Physician Treatment Plan for Secondary Diagnosis: Principal Problem:   MDD (major depressive disorder), severe (HCC) Active Problems:   PTSD (post-traumatic stress disorder)   Nightmares associated with chronic post-traumatic stress disorder   Severe alcohol  use disorder (HCC)   GAD (generalized anxiety disorder)  Long Term Goal(s):     Short Term Goals:       Medication Management: Evaluate patient's response, side effects, and tolerance of medication regimen.  Therapeutic Interventions: 1 to 1 sessions, Unit Group sessions and Medication administration.  Evaluation of Outcomes: Progressing   RN Treatment Plan for Primary Diagnosis: MDD (major depressive disorder), severe (HCC) Long Term Goal(s): Knowledge of disease and therapeutic regimen to maintain health will improve  Short Term Goals: Ability to remain free from injury will improve, Ability to verbalize frustration and anger appropriately will improve, Ability to verbalize feelings will improve, and Ability to disclose and discuss suicidal ideas  Medication Management: RN will administer medications as ordered by provider, will assess and evaluate patient's response and provide education to patient for prescribed medication. RN will report any adverse and/or side effects to prescribing provider.  Therapeutic Interventions: 1 on 1 counseling sessions,  Psychoeducation, Medication administration, Evaluate responses to treatment, Monitor vital signs and CBGs as ordered, Perform/monitor CIWA, COWS, AIMS and Fall Risk screenings as ordered, Perform wound care treatments as ordered.  Evaluation of Outcomes: Progressing   LCSW Treatment Plan for Primary Diagnosis: MDD (major depressive disorder), severe (HCC) Long Term Goal(s): Safe transition to appropriate next level of care at discharge, Engage patient in therapeutic group addressing interpersonal concerns.  Short Term Goals: Engage patient in aftercare planning with referrals and resources, Increase ability to appropriately verbalize feelings, Facilitate patient progression through stages of change regarding substance use diagnoses and concerns, and Increase skills for wellness and recovery  Therapeutic Interventions: Assess for all discharge needs, 1 to 1 time with Social worker, Explore available resources and support systems, Assess for adequacy in  community support network, Educate family and significant other(s) on suicide prevention, Complete Psychosocial Assessment, Interpersonal group therapy.  Evaluation of Outcomes: Progressing  Progress in Treatment: Attending groups: Yes. Participating in groups: Yes. Taking medication as prescribed: Yes. Toleration medication: Yes. Family/Significant other contact made:  Yes, contacted Harlene Barge (wife) [phone] Patient understands diagnosis: Yes. Discussing patient identified problems/goals with staff: Yes. Medical problems stabilized or resolved: Yes. Denies suicidal/homicidal ideation: Yes. Issues/concerns per patient self-inventory: No.   New problem(s) identified:  No   New Short Term/Long Term Goal(s):     medication stabilization, elimination of SI thoughts, development of comprehensive mental wellness plan.      Patient Goals:  I want to stay on my medications.   Discharge Plan or Barriers:  Patient recently  admitted. CSW will continue to follow and assess for appropriate referrals and possible discharge planning.      Reason for Continuation of Hospitalization: Anxiety Depression Medication stabilization Suicidal ideation   Estimated Length of Stay:  4 - 6 days  Last 3 [place] Suicide Severity Risk Score: Flowsheet Row [facility] (Current[facility] Most recent reading at 12/13/2023 12:00 AM ED from 12/12/2023 in [facility] Most recent reading at 12/12/2023  5:50 PM ED from 10/31/2023 in [facility] Emergency Department [facility] Most recent reading at 10/31/2023  1:25 PM  C-SSRS RISK CATEGORY No Risk No Risk No Risk    Last PHQ 2/9 Scores:    10/24/2023    9:48 AM 05/13/2023   10:05 AM 03/06/2023   11:32 AM  Depression screen PHQ 2/9  Decreased Interest 1 2 1   Down, Depressed, Hopeless 2 0 1  PHQ - 2 Score 3 2 2   Altered sleeping 0 0 1  Tired, decreased energy 1 2 0  Change in appetite 0 3 1  Feeling bad or failure about yourself  1 1 1   Trouble concentrating 0 3 1  Moving slowly or fidgety/restless 1 2 2   Suicidal thoughts 1 0 1  PHQ-9 Score 7 13 9   Difficult doing work/chores Somewhat difficult Not difficult at all Somewhat difficult    Scribe for Treatment Team: Nainoa Woldt O Mahealani Sulak, LCSWA 12/18/2023 10:43 AM

## 2023-12-18 NOTE — Progress Notes (Signed)
   12/18/23 2100  Psych Admission Type (Psych Patients Only)  Admission Status Voluntary  Psychosocial Assessment  Patient Complaints None  Eye Contact Fair  Facial Expression Flat  Affect Flat  Speech Logical/coherent  Interaction Minimal  Motor Activity Slow  Appearance/Hygiene Unremarkable  Behavior Characteristics Cooperative  Mood Depressed  Thought Process  Coherency WDL  Content WDL  Delusions None reported or observed  Perception WDL  Hallucination None reported or observed  Judgment Impaired  Confusion None  Danger to Self  Current suicidal ideation? Denies  Agreement Not to Harm Self Yes  Description of Agreement Verbal  Danger to Others  Danger to Others None reported or observed

## 2023-12-19 ENCOUNTER — Ambulatory Visit (HOSPITAL_COMMUNITY): Payer: MEDICAID

## 2023-12-19 NOTE — Plan of Care (Signed)

## 2023-12-19 NOTE — Group Note (Signed)
 Date:  12/19/2023 Time:  5:33 PM  Group Topic/Focus:  Recreational Therapy    Participation Level:  Pt did not attend Recreational Therapy  Participation Quality:  Pt did not attend Recreational Therapy  Affect:  Pt did not attend Recreational Therapy  Cognitive:  Pt did not attend Recreational Therapy  Insight: Pt did not attend Recreational Therapy  Engagement in Group:  Pt did not attend Recreational Therapy  Modes of Intervention:  Pt did not attend Recreational Therapy  Additional Comments:  Pt did not attend Recreational Therapy  Jezabella Schriever E Wesam Gearhart 12/19/2023, 5:33 PM

## 2023-12-19 NOTE — Group Note (Signed)
 Date:  12/19/2023 Time:  8:47 PM  Group Topic/Focus:  Wrap-Up Group:   The focus of this group is to help patients review their daily goal of treatment and discuss progress on daily workbooks.    Participation Level:  Active  Participation Quality:  Appropriate  Affect:  Appropriate  Cognitive:  Appropriate  Insight: Appropriate  Engagement in Group:  Engaged  Modes of Intervention:  Socialization  Additional Comments:    Kristen VEAR Gibbon 12/19/2023, 8:47 PM

## 2023-12-19 NOTE — Group Note (Signed)
 LCSW Group Therapy Note   Group Date: 12/19/2023 Start Time: 1100 End Time: 1200   Participation:  did not attend  Type of Therapy:  Group Therapy  Topic:  Finding Balance: Using Wise Mind for Thoughtful Decisions  Objective:  To help participants understand and apply the concept of Delsie Mind to make balanced, thoughtful decisions by integrating emotion and logic.  Goals: Learn the differences between Emotional Mind, Reasonable Mind, and [employer]. Recognize personal signs of Emotional and Reasonable Mind. Practice using [employer] in real-life scenarios.  Summary:  This class focused on Wise Mind--DBT's concept of balancing Emotional Mind and Reasonable Mind. We identified when we're in each state and practiced using Wise Mind to respond thoughtfully in real-life situations. By combining emotion and logic, participants can improve decision-making, manage challenges, and enhance relationships.  Therapeutic Modalities: Elements of Dialectical Behavior Therapy (DBT):  Mindfulness (noticing thoughts and emotions without judgment), Emotion Regulation (understanding and managing emotional responses), Distress Tolerance (coping with difficult situations without making them worse), Wise Mind (integrating emotion and reason for balanced decision-making) Elements of Cognitive Behavioral Therapy (CBT):  Identifying automatic thoughts, Challenging cognitive distortions, Using logic to reframe unhelpful thinking patterns   Catherene MALVA Dynes, LCSWA 12/19/2023  12:10 PM

## 2023-12-19 NOTE — Plan of Care (Signed)
  Problem: Education: Goal: Emotional status will improve Outcome: Progressing Goal: Mental status will improve Outcome: Progressing   Problem: Activity: Goal: Interest or engagement in activities will improve Outcome: Not Progressing   

## 2023-12-19 NOTE — Group Note (Signed)
 Date:  12/19/2023 Time:  11:19 AM  Group Topic/Focus:  Building Self Esteem:   The Focus of this group is helping patients become aware of the effects of self-esteem on their lives, the things they and others do that enhance or undermine their self-esteem, seeing the relationship between their level of self-esteem and the choices they make and learning ways to enhance self-esteem. Managing Feelings:   The focus of this group is to identify what feelings patients have difficulty handling and develop a plan to handle them in a healthier way upon discharge.    Participation Level:  Did Not Attend  Participation Quality:  n/a  Affect:  n/a  Cognitive:  n/a  Insight: None  Engagement in Group:  n/a  Modes of Intervention:  n/a  Additional Comments:  pt did not attend  Evan Wilson 12/19/2023, 11:19 AM

## 2023-12-19 NOTE — Group Note (Signed)
 Recreation Therapy Group Note   Group Topic:Other  Group Date: 12/19/2023 Start Time: 1300 End Time: 1400 Facilitators: Brithany Whitworth-McCall, LRT,CTRS Location: [address] Dayroom   Activity Description/Intervention: Therapeutic Drumming. Patients with peers and staff were given the opportunity to engage in a leader facilitated [facility] Group Empowerment Drumming Circle with staff from the [employer], in partnership with [employer]. Teaching laboratory technician and trained [employer], Norleen Mon leading with LRT observing and documenting intervention and pt response. This evidenced-based practice targets 7 areas of health and wellbeing in the human experience including: stress-reduction, exercise, self-expression, camaraderie/support, nurturing, spirituality, and music-making (leisure).   Goal Area(s) Addresses:  Patient will engage in pro-social way in music group.  Patient will follow directions of drum leader on the first prompt. Patient will demonstrate no behavioral issues during group.  Patient will identify if a reduction in stress level occurs as a result of participation in therapeutic drum circle.    Education: Leisure exposure, Pharmacologist, Musical expression, Discharge Planning   Affect/Mood: N/A   Participation Level: Did not attend    Clinical Observations/Individualized Feedback:      Plan: Continue to engage patient in RT group sessions 2-3x/week.   Marge Vandermeulen-McCall, LRT,CTRS 12/19/2023 2:28 PM

## 2023-12-19 NOTE — Plan of Care (Signed)
   Problem: Education: Goal: Emotional status will improve Outcome: Progressing Goal: Mental status will improve Outcome: Progressing

## 2023-12-19 NOTE — Progress Notes (Signed)
 CSW called [name] [phone] to find out admissions status update and was informed a decision had not been made as of yet but to call back this afternoon or tomorrow morning. CSW informed patient of this. CSW to follow-up with [name] this afternoon regarding patient's admission.  Driana Dazey, LCSWA 12/19/23 10:21AM

## 2023-12-19 NOTE — Progress Notes (Signed)
   12/19/23 1000  Psych Admission Type (Psych Patients Only)  Admission Status Voluntary  Psychosocial Assessment  Patient Complaints None  Eye Contact Fair  Facial Expression Flat  Affect Flat  Speech Logical/coherent  Interaction Minimal  Motor Activity Slow  Appearance/Hygiene Unremarkable  Behavior Characteristics Cooperative  Mood Depressed  Thought Process  Coherency WDL  Content WDL  Delusions None reported or observed  Perception WDL  Hallucination None reported or observed  Judgment Impaired  Confusion None  Danger to Self  Current suicidal ideation? Denies  Agreement Not to Harm Self Yes  Description of Agreement verbal  Danger to Others  Danger to Others None reported or observed

## 2023-12-19 NOTE — Progress Notes (Signed)
(  Sleep Hours) - 8.25 (Any PRNs that were needed, meds refused, or side effects to meds)- n/a (Any disturbances and when (visitation, over night)- n/a (Concerns raised by the patient)- n/a  (SI/HI/AVH)- denies

## 2023-12-19 NOTE — Progress Notes (Signed)
 Tour of Duty:  Prentice JINNY Angle, RN, 12/19/23, Tour of Duty: [phone]  SI/HI/AVH: Denies  Self-Reported   Mood: Neutral  Anxiety: Denies Depression: Denies, but Observable Irritability: Denies  Broset  Violence Prevention Guidelines *See Row Information*: (not recorded)   LBM  Last BM Date : (not recorded)   Pain: present, interventions include: rest  Patient Refusals (including Rx): No  >>Shift Summary: Patient observed to be calm, flat, and minimal on unit. Patient able to make needs known. Patient observed to engage appropriately with staff and peers. Patient taking medications as prescribed. This shift, no PRN medication requested or required. No reported or observed side effects to medication. No reported or observed agitation, aggression, or other acute emotional distress. No reported or observed physical abnormalities or concerns.  Last Vitals  Vitals Weight: 102.5 kg Temp: 98.1 F (36.7 C) Temp Source: Oral Pulse Rate: 72 Resp: 20 BP: (!) 149/84 Patient Position: (not recorded)  Admission Type  Psych Admission Type (Psych Patients Only) Admission Status: Voluntary Date 72 hour document signed : (not recorded) Time 72 hour document signed : (not recorded) Provider Notified (First and Last Name) (see details for LINK to note): (not recorded)   Psychosocial Assessment  Psychosocial Assessment Patient Complaints: None Eye Contact: Fair Facial Expression: Flat Affect: Flat Speech: Logical/coherent Interaction: Forwards little Motor Activity: Slow Appearance/Hygiene: Unremarkable Behavior Characteristics: Cooperative Mood: Depressed   Aggressive Behavior  Targets: (not recorded)   Thought Process  Thought Process Coherency: Within Defined Limits Content: Within Defined Limits Delusions: None reported or observed Perception: Within Defined Limits Hallucination: None reported or observed Judgment: Impaired Confusion: None  Danger to  Self/Others  Danger to Self Current suicidal ideation?: Denies Description of Suicide Plan: (not recorded) Self-Injurious Behavior: (not recorded) Agreement Not to Harm Self: (not recorded) Description of Agreement: (not recorded) Danger to Others: None reported or observed
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
## 2023-12-19 NOTE — Group Note (Signed)
 Date:  12/19/2023 Time:  12:12 PM  Group Topic/Focus:  Nutrition Group    Participation Level:  Did Not Attend  Participation Quality: Pt did not attend Nutrition Group Pt did not attend Nutrition Group  Affect:  Pt did not attend Nutrition Group  Cognitive:  Pt did not attend Nutrition Group  Insight: Pt did not attend Nutrition Group  Engagement in Group:  Pt did not attend Nutrition Group  Modes of Intervention:  Pt did not attend Nutrition Group  Additional Comments:  Pt did not attend Nutrition Group  [name] E [name] 12/19/2023, 12:12 PM

## 2023-12-19 NOTE — Group Note (Signed)
 Therapy Group Note  Group Topic:Other  Group Date: 12/19/2023 Start Time: 1500 End Time: 1530 Facilitators: Wei Poplaski G, OT    The primary objective of this topic is to explore and understand the concept of occupational balance in the context of daily living. The term occupational balance is defined broadly, encompassing all activities that occupy an individual's time and energy, including self-care, leisure, and work-related tasks. The goal is to guide participants towards achieving a harmonious blend of these activities, tailored to their personal values and life circumstances. This balance is aimed at enhancing overall well-being, not by equally distributing time across activities, but by ensuring that daily engagements are fulfilling and not draining. The content delves into identifying various barriers that individuals face in achieving occupational balance, such as overcommitment, misaligned priorities, external pressures, and lack of effective time management. The impact of these barriers on occupational performance, roles, and lifestyles is examined, highlighting issues like reduced efficiency, strained relationships, and potential health problems. Strategies for cultivating occupational balance are a key focus. These strategies include practical methods like time blocking, prioritizing tasks, establishing self-care rituals, decluttering, connecting with nature, and engaging in reflective practices. These approaches are designed to be adaptable and applicable to a wide range of life scenarios, promoting a proactive and mindful approach to daily living. The overall aim is to equip participants with the knowledge and tools to create a balanced lifestyle that supports their mental, emotional, and physical health, thereby improving their functional performance in daily life.     Participation Level: Engaged   Participation Quality: Independent   Behavior: Appropriate    Speech/Thought Process: Relevant   Affect/Mood: Appropriate   Insight: Fair   Judgement: Fair      Modes of Intervention: Education  Patient Response to Interventions:  Attentive   Plan: Continue to engage patient in OT groups 2 - 3x/week.  12/19/2023  [name], OT   Evan Wilson, OT

## 2023-12-20 NOTE — BH Assessment (Signed)
(  Sleep Hours) - 6.5 (Any PRNs that were needed, meds refused, or side effects to meds)-  (Any disturbances and when (visitation, over night)- None (Concerns raised by the patient)- None (SI/HI/AVH)- Denies

## 2023-12-20 NOTE — B[facility] Group Notes (Signed)
 Adult Psychoeducational Group Note  Date:  12/20/2023 Time:  6:19 PM  Group Topic/Focus:  Overcoming Stress:   The focus of this group is to define stress and help patients assess their triggers.  Participation Level:  Did Not Attend  Participation Quality:  na  Affect:  na  Cognitive:  na  Insight: Limited and None  Engagement in Group:  na  Modes of Intervention:  na  Additional Comments:  na  Joaquin MALVA Doing 12/20/2023, 6:19 PM

## 2023-12-20 NOTE — Group Note (Signed)
 Date:  12/20/2023 Time:  9:48 AM  Group Topic/Focus:  Goals Group:   The focus of this group is to help patients establish daily goals to achieve during treatment and discuss how the patient can incorporate goal setting into their daily lives to aide in recovery.    Participation Level:  Did Not Attend  Participation Quality:  N/A  Affect:  N/A  Cognitive:  N/A  Insight: None  Engagement in Group:  None  Modes of Intervention:  N/A  Additional Comments:  Patient did not attend goals group.  Kristi HERO Isaic Syler 12/20/2023, 9:48 AM

## 2023-12-20 NOTE — Progress Notes (Signed)
   12/20/23 2130  Psych Admission Type (Psych Patients Only)  Admission Status Voluntary  Psychosocial Assessment  Patient Complaints None  Eye Contact Fair  Facial Expression Flat  Affect Flat  Speech Logical/coherent  Interaction Minimal  Motor Activity Slow  Appearance/Hygiene Unremarkable  Behavior Characteristics Cooperative  Mood Depressed;Pleasant  Thought Process  Coherency WDL  Content WDL  Delusions None reported or observed  Perception WDL  Hallucination None reported or observed  Judgment Impaired  Confusion None  Danger to Self  Current suicidal ideation? Denies

## 2023-12-20 NOTE — Group Note (Signed)
 Recreation Therapy Group Note   Group Topic:Communication  Group Date: 12/20/2023 Start Time: 0935 End Time: 1015 Facilitators: Maily Debarge-McCall, LRT,CTRS Location: [address] Dayroom   Goal Area(s) Addresses:  Patient will effectively listen to complete activity.  Patient will identify communication skills used to make activity successful.  Patient will identify how skills used during activity can be used to reach post d/c goals.    Behavioral Response:   Intervention: Building surveyor Activity - Geometric pattern cards, pencils, blank paper    Activity: Geometric Drawings.  Three volunteers from the peer group will be shown an abstract picture with a particular arrangement of geometrical shapes.  Each round, one 'speaker' will describe the pattern, as accurately as possible without revealing the image to the group.  The remaining group members will listen and draw the picture to reflect how it is described to them. Patients with the role of 'listener' cannot ask clarifying questions but, may request that the speaker repeat a direction. Once the drawings are complete, the presenter will show the rest of the group the picture and compare how close each person came to drawing the picture. LRT will facilitate a post-activity discussion regarding effective communication and the importance of planning, listening, and asking for clarification in daily interactions with others.  Education: Environmental consultant, Active listening, Support systems, Discharge planning  Education Outcome: Acknowledges understanding/In group clarification offered/Needs additional education.    Affect/Mood: N/A   Participation Level: Did not attend    Clinical Observations/Individualized Feedback:      Plan: Continue to engage patient in RT group sessions 2-3x/week.   Yariah Selvey-McCall, LRT,CTRS 12/20/2023 11:25 AM

## 2023-12-20 NOTE — Group Note (Signed)
 Date:  12/20/2023 Time:  7:02 PM  Group Topic/Focus:  Bingo - Positive Socialization  The group engaged in a [employer] designed to encourage positive socialization, promote a supportive environment, and enhance cognitive skills. Patients participated actively, demonstrating good communication and cooperation with peers. The activity was structured to create a relaxed, enjoyable atmosphere, fostering a sense of community among participants.  Participation Level:  Did Not Attend  Participation Quality:  N/A  Affect:  N/A  Cognitive:  N/A  Insight: None  Engagement in Group:  None  Modes of Intervention:  N/A  Additional Comments:  Patient did not attend bingo.  Kristi HERO Blayke Pinera 12/20/2023, 7:02 PM

## 2023-12-20 NOTE — Group Note (Signed)
 Date:  12/20/2023 Time:  4:03 PM  Group Topic/Focus:  Exploring Control Through Creative Expression  The focus of this group is to encourage insight into personal experiences of control and flexibility, promote creative expression, and enhance group cohesion through shared activity and reflection.  Patients participated in a structured art-based group activity focused on the theme of control. Seated in a circle in the dayroom, each patient received a blank sheet of paper and was instructed to draw anything that came to mind without including their name. Every two minutes, papers were passed to the right, allowing each participant to add to or alter another person's drawing.  At the conclusion of the exercise, patients reflected on their emotional experiences during the process--particularly feelings associated with gaining, losing, and sharing control. The group discussed how control can shift due to external circumstances or the actions of others and explored ways to view these changes in a positive and adaptive manner. Patients shared their final collaborative drawings with the group.   Participation Level:  Did Not Attend  Participation Quality:  N/A  Affect:  N/A  Cognitive:  N/A  Insight: None  Engagement in Group:  None  Modes of Intervention:  N/A  Additional Comments:  Patient did not attend this wellness group.  Kristi HERO Francessca Friis 12/20/2023, 4:03 PM

## 2023-12-20 NOTE — Group Note (Signed)
 Date:  12/20/2023 Time:  7:12 PM  Group Topic/Focus:  Bingo - Positive Socialization  The group engaged in a [employer] designed to encourage positive socialization, promote a supportive environment, and enhance cognitive skills. Patients participated actively, demonstrating good communication and cooperation with peers. The activity was structured to create a relaxed, enjoyable atmosphere, fostering a sense of community among participants.  Participation Level:  Did Not Attend  Participation Quality:  N/A  Affect:  N/A  Cognitive:  N/A  Insight: None  Engagement in Group:  None  Modes of Intervention:  N/A  Additional Comments:  Patient did not attend bingo.  Kristi HERO Kregg Cihlar 12/20/2023, 7:12 PM

## 2023-12-20 NOTE — Progress Notes (Signed)
 D: Patient is alert, oriented, and cooperative. Denies SI, HI, AVH, and verbally contracts for safety. Patient reports he slept fair last night without sleeping medication. Patient reports his appetite as good, energy level as normal, and concentration as good. Patient rates his depression 3/10, hopelessness 2/10, and anxiety 2/10.    A: Scheduled medications administered per MD order. Support provided. Patient educated on safety on the unit and medications. Routine safety checks every 15 minutes. Patient stated understanding to tell nurse about any new physical symptoms. Patient understands to tell staff of any needs.     R: No adverse drug reactions noted. Patient remains safe at this time and will continue to monitor.    12/20/23 1100  Psych Admission Type (Psych Patients Only)  Admission Status Voluntary  Psychosocial Assessment  Patient Complaints None  Eye Contact Fair  Facial Expression Flat  Affect Flat  Speech Logical/coherent  Interaction Minimal  Motor Activity Slow  Appearance/Hygiene Unremarkable  Behavior Characteristics Cooperative  Mood Depressed  Thought Process  Coherency WDL  Content WDL  Delusions None reported or observed  Perception WDL  Hallucination None reported or observed  Judgment Impaired  Confusion None  Danger to Self  Current suicidal ideation? Denies  Agreement Not to Harm Self Yes  Description of Agreement verbal  Danger to Others  Danger to Others None reported or observed

## 2023-12-20 NOTE — Plan of Care (Signed)

## 2023-12-20 NOTE — Group Note (Signed)
 Date:  12/20/2023 Time:  8:56 PM  Group Topic/Focus:  Wrap-Up Group:   The focus of this group is to help patients review their daily goal of treatment and discuss progress on daily workbooks.    Participation Level:  Did Not Attend  Participation Quality:  Patient did not participate in group  Affect:  Flat and    Cognitive:  Lacking  Insight: None  Engagement in Group:  None  Modes of Intervention:  Discussion  Additional Comments:  Patient did not attend group this evening   Bari Moats 12/20/2023, 8:56 PM
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
## 2023-12-21 DIAGNOSIS — F4312 Post-traumatic stress disorder, chronic: Secondary | ICD-10-CM

## 2023-12-21 MED ORDER — BACLOFEN 10 MG PO TABS: 10.0000 mg | ORAL_TABLET | Freq: Three times a day (TID) | ORAL | 0 refills | Status: DC

## 2023-12-21 MED ORDER — QUETIAPINE FUMARATE 200 MG PO TABS: 200.0000 mg | ORAL_TABLET | Freq: Every day | ORAL | 0 refills | Status: DC

## 2023-12-21 MED ORDER — PRAZOSIN HCL 1 MG PO CAPS: 1.0000 mg | ORAL_CAPSULE | Freq: Every day | ORAL | 0 refills | Status: DC

## 2023-12-21 MED ORDER — PAROXETINE HCL ER 37.5 MG PO TB24: 37.5000 mg | ORAL_TABLET | Freq: Every day | ORAL | 0 refills | Status: DC

## 2023-12-21 MED ORDER — NALTREXONE HCL 50 MG PO TABS: 50.0000 mg | ORAL_TABLET | Freq: Every day | ORAL | 0 refills | Status: DC

## 2023-12-21 NOTE — Progress Notes (Signed)
 (  Sleep Hours) - 8.5 (Any PRNs that were needed, meds refused, or side effects to meds)- none (Any disturbances and when (visitation, over night)- none (Concerns raised by the patient)- none  (SI/HI/AVH)- denies

## 2023-12-21 NOTE — Progress Notes (Signed)
 Pt discharged to lobby with taxi voucher. Pt was stable and appreciative at that time. All papers and prescriptions were given and valuables returned. Suicide safety plan completed.  Verbal understanding expressed. Denies SI/HI and A/VH. Pt given opportunity to express concerns and ask questions.

## 2023-12-21 NOTE — Group Note (Signed)
 Date:  12/21/2023 Time:  10:06 AM  Group Topic/Focus:  Goals Group:   The focus of this group is to help patients establish daily goals to achieve during treatment and discuss how the patient can incorporate goal setting into their daily lives to aide in recovery. Orientation:   The focus of this group is to educate the patient on the purpose and policies of crisis stabilization and provide a format to answer questions about their admission.  The group details unit policies and expectations of patients while admitted.    Participation Level:  Did Not Attend   [name] 12/21/2023, 10:06 AM
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
## 2023-12-21 NOTE — BHH Suicide Risk Assessment (Signed)
 [facility] Discharge Suicide Risk Assessment   Principal Problem: MDD (major depressive disorder), severe (HCC) Discharge Diagnoses: Principal Problem:   MDD (major depressive disorder), severe (HCC) Active Problems:   PTSD (post-traumatic stress disorder)   Nightmares associated with chronic post-traumatic stress disorder   Severe alcohol  use disorder (HCC)   GAD (generalized anxiety disorder)  During the patient's hospitalization, patient had extensive initial psychiatric evaluation, and follow-up psychiatric evaluations every day.  Psychiatric diagnoses provided upon initial assessment:  MDD (major depressive disorder), severe (HCC) Active Problems:   PTSD (post-traumatic stress disorder)   Nightmares associated with chronic post-traumatic stress disorder   Severe alcohol  use disorder (HCC)   GAD (generalized anxiety disorder)  Patient's psychiatric medications were adjusted on admission: His Paxil  was increased.  He was continued on his Gabapentin , Seroquel , Prazosin , and Naltrexone .  During the hospitalization, other adjustments were made to the patient's psychiatric medication regimen: None  Gradually, patient started adjusting to milieu.   Patient's care was discussed during the interdisciplinary team meeting every day during the hospitalization.  The patient is having side effects to prescribed psychiatric medication.  The patient reports their target psychiatric symptoms of depression, anxiety, and alcohol  cravings responded well to the psychiatric medications, and the patient reports overall benefit other psychiatric hospitalization. Supportive psychotherapy was provided to the patient. The patient also participated in regular group therapy while admitted.   Labs were reviewed with the patient, and abnormal results were discussed with the patient.  The patient denied having suicidal thoughts more than 48 hours prior to discharge.  Patient denies having homicidal thoughts.   Patient denies having auditory hallucinations.  Patient denies any visual hallucinations.  Patient denies having paranoid thoughts.  The patient is able to verbalize their individual safety plan to this provider.  It is recommended to the patient to continue psychiatric medications as prescribed, after discharge from the hospital.    It is recommended to the patient to follow up with your outpatient psychiatric provider and PCP.  Discussed with the patient, the impact of alcohol , drugs, tobacco have been there overall psychiatric and medical wellbeing, and total abstinence from substance use was recommended the patient.  Total Time spent with patient: 20 minutes  Musculoskeletal: Strength & Muscle Tone: within normal limits Gait & Station: normal Patient leans: N/A  Psychiatric Specialty Exam  Presentation  General Appearance:  Appropriate for Environment; Casual  Eye Contact: Good  Speech: Clear and Coherent; Normal Rate  Speech Volume: Normal  Handedness: Right   Mood and Affect  Mood: Euthymic  Duration of Depression Symptoms: Greater than two weeks  Affect: Appropriate; Congruent   Thought Process  Thought Processes: Coherent; Goal Directed  Descriptions of Associations:Intact  Orientation:Full (Time, Place and Person)  Thought Content:Logical; WDL  History of Schizophrenia/Schizoaffective disorder:No  Duration of Psychotic Symptoms:N/A  Hallucinations:Hallucinations: None Description of Auditory Hallucinations: minimal AH  Ideas of Reference:None  Suicidal Thoughts:Suicidal Thoughts: No  Homicidal Thoughts:Homicidal Thoughts: No   Sensorium  Memory: Immediate Good; Recent Good  Judgment: Fair  Insight: Fair   Art Therapist  Concentration: Good  Attention Span: Good  Recall: Good  Fund of Knowledge: Good  Language: Good   Psychomotor Activity  Psychomotor Activity: Psychomotor Activity: Normal   Assets   Assets: Communication Skills; Desire for Improvement; Resilience; Housing   Sleep  Sleep: Sleep: Good  Estimated Sleeping Duration (Last 24 Hours): 6.00-8.25 hours  Physical Exam: Physical Exam Vitals and nursing note reviewed.  Constitutional:      General: He  is not in acute distress.    Appearance: Normal appearance. He is normal weight. He is not ill-appearing or toxic-appearing.  HENT:     Head: Normocephalic and atraumatic.  Pulmonary:     Effort: Pulmonary effort is normal.  Musculoskeletal:        General: Normal range of motion.  Neurological:     General: No focal deficit present.     Mental Status: He is alert.    Review of Systems  Respiratory:  Negative for cough and shortness of breath.   Cardiovascular:  Negative for chest pain.  Gastrointestinal:  Negative for abdominal pain, constipation, diarrhea, nausea and vomiting.  Neurological:  Negative for dizziness, weakness and headaches.  Psychiatric/Behavioral:  Negative for depression, hallucinations and suicidal ideas. The patient is not nervous/anxious.    Blood pressure 118/79, pulse 76, temperature 97.9 F (36.6 C), temperature source Oral, resp. rate 16, height 5' 11 (1.803 m), weight 102.5 kg, SpO2 98%. Body mass index is 31.52 kg/m.  Mental Status Per Nursing Assessment::   On Admission:  Suicidal ideation indicated by patient, Self-harm thoughts  Demographic Factors:  Male, Caucasian, and Unemployed  Loss Factors: Legal issues and Financial problems/change in socioeconomic status  Historical Factors: Prior suicide attempts, Family history of suicide, and Family history of mental illness or substance abuse  Risk Reduction Factors:   Responsible for children under 20 years of age, Sense of responsibility to family, Living with another person, especially a relative, Positive social support, and Positive therapeutic relationship  Continued Clinical Symptoms:  Alcohol /Substance  Abuse/Dependencies More than one psychiatric diagnosis Previous Psychiatric Diagnoses and Treatments Medical Diagnoses and Treatments/Surgeries  Cognitive Features That Contribute To Risk:  None    Suicide Risk:  Minimal: No identifiable suicidal ideation.  Patients presenting with no risk factors but with morbid ruminations; may be classified as minimal risk based on the severity of the depressive symptoms.  However, as he does have a history of Prior Suicide Attempts, there is some chronic risk present.   Follow-up Information     [facility] Follow up.   Specialty: Addiction Medicine Why: Referral made Contact information: [address] [place] [address] [phone]         [facility]. Go on 01/02/2024.   Specialty: Behavioral Health Why: You have an appointment for therapy services on 01/02/24 at 1:00 pm, in person.  If you wish to be seen sooner, please go on Tuesdays, arrive by 7:00 am. Contact information: [address] [place]  [address] [phone]        [facility] [place] Follow up on 12/30/2023.   Specialty: Behavioral Health Why: You have an appointment for medication management services on 12/30/23 at 9:20 am with Dr. Tobie. Contact information: [address] [place]  [address] [phone]                Plan Of Care/Follow-up recommendations:  Activity: as tolerated  Diet: heart healthy  Other: -Follow-up with your outpatient psychiatric provider -instructions on appointment date, time, and address (location) are provided to you in discharge paperwork.  -Take your psychiatric medications as prescribed at discharge - instructions are provided to you in the discharge paperwork  -Follow-up with outpatient primary care doctor and other specialists -for management of chronic medical disease, including:  Routine Care. Continued care for Substance Abuse.  -Testing: Follow-up with outpatient provider for abnormal lab results: None  -Recommend  abstinence from alcohol , tobacco, and other illicit drug use at discharge.   -If your psychiatric symptoms recur, worsen, or if you have side effects to your psychiatric medications, call your outpatient psychiatric provider, 911, [phone] or go to the nearest emergency department.  -If suicidal thoughts recur, call your outpatient psychiatric provider, 911, [phone] or go to the nearest emergency department.   Marsa GORMAN Rosser, DO 12/21/2023, 8:11 AM

## 2023-12-21 NOTE — Progress Notes (Signed)
  [facility] Adult Case Management Discharge Plan :  Will you be returning to the same living situation after discharge:  Yes,    At discharge, do you have transportation home?: Yes,  CSW  to schedule transport via [employer] Do you have the ability to pay for your medications: Yes,  the patient has PRIMARY INS: [employer] / [employer]  Release of information consent forms completed and in the chart;  Patient's signature needed at discharge.  Patient to Follow up at:  Follow-up Information     [facility] Follow up.   Specialty: Addiction Medicine Why: Referral made Contact information: [address] [place] [address] [phone]         [facility]. Go on 01/02/2024.   Specialty: Behavioral Health Why: You have an appointment for therapy services on 01/02/24 at 1:00 pm, in person.  If you wish to be seen sooner, please go on Tuesdays, arrive by 7:00 am. Contact information: [address] [place]  [address] [phone]        [facility] [place] Follow up on 12/30/2023.   Specialty: Behavioral Health Why: You have an appointment for medication management services on 12/30/23 at 9:20 am with Dr. Tobie. Contact information: [address] [place]  [address] [phone]                Next level of care provider has access to [facility] Link:no  Safety Planning and Suicide Prevention discussed: Yes,  completed with the patient's wife  Harlene Barge (wife) [phone]     Has patient been referred to the Quitline?: Name : [id number] Referral Id : Client Name : quitlinenc Insurance Carrier : First Name : Ashtin Middle Name : Last Name : Ishida Gender : Date of Birth : [birth year] Primary Language : English Primary Phone Number : [phone] Best Time to Contact : Weekdays - Afternoons  (12pm-4pm);Weekends - Afternoons (12pm-4pm) Referral Date Received : 2023-12-21 Referral Time Received : 8:59:40 AM Terminal Status Date :  Patient has been referred for addiction treatment: Yes, the patient will follow up with an outpatient provider for substance use disorder. Psychiatrist/APP: appointment made  [facility], LCSWA 12/21/2023, 10:00 AM

## 2023-12-21 NOTE — Progress Notes (Addendum)
 D. Pt presents brighter, reported that he was looking forward to discharging today. Pt reported sleeping well last night, described his appetite and concentration as 'good', and energy level as 'normal'. Per pt's self inventory, pt rated his depression,hopelessness and anxiety a 2/0/0, respectively. Pt's stated goal today is to work on staying sober when I go home. Pt currently denies SI/HI and AVH  A. Labs and vitals monitored. Pt given and educated on medications. Pt supported emotionally and encouraged to express concerns and ask questions.   R. Pt remains safe with 15 minute checks. Will continue POC.    12/21/23 0800  Psych Admission Type (Psych Patients Only)  Admission Status Voluntary  Psychosocial Assessment  Patient Complaints None  Eye Contact Fair  Facial Expression Flat  Affect Flat  Speech Logical/coherent  Interaction Minimal  Motor Activity Slow  Appearance/Hygiene Unremarkable  Behavior Characteristics Cooperative  Mood Pleasant  Thought Process  Coherency WDL  Content WDL  Delusions None reported or observed  Perception WDL  Hallucination None reported or observed  Judgment Impaired  Confusion None  Danger to Self  Current suicidal ideation? Denies  Danger to Others  Danger to Others None reported or observed

## 2023-12-21 NOTE — Plan of Care (Signed)
   Problem: Education: Goal: Emotional status will improve Outcome: Progressing Goal: Mental status will improve Outcome: Progressing   Problem: Activity: Goal: Sleeping patterns will improve Outcome: Progressing

## 2023-12-21 NOTE — Group Note (Signed)
 Date:  12/21/2023 Time:  10:42 AM  Group Topic/Focus:   Social Wellness: To foster a sense of well-being, connection, and personal growth within a supportive community. Such groups focus on improving various aspects of mental, emotional, and social health while encouraging positive interactions and self-awareness. Anger Iceberg: To help participants gain insight into the complex nature of anger and explore the deeper, often hidden emotions that lie beneath it. To understand how emotions can be presented as anger towards others.   Participation Level:  Did Not Attend  Evan Wilson 12/21/2023, 10:42 AM

## 2023-12-30 ENCOUNTER — Encounter: Payer: Self-pay | Admitting: Internal Medicine

## 2023-12-30 ENCOUNTER — Ambulatory Visit (INDEPENDENT_AMBULATORY_CARE_PROVIDER_SITE_OTHER): Payer: MEDICAID | Admitting: Internal Medicine

## 2023-12-30 VITALS — BP 120/75 | HR 87 | Ht 71.0 in | Wt 230.4 lb

## 2023-12-30 DIAGNOSIS — F515 Nightmare disorder: Secondary | ICD-10-CM | POA: Diagnosis not present

## 2023-12-30 DIAGNOSIS — F411 Generalized anxiety disorder: Secondary | ICD-10-CM

## 2023-12-30 DIAGNOSIS — F4312 Post-traumatic stress disorder, chronic: Secondary | ICD-10-CM

## 2023-12-30 DIAGNOSIS — F322 Major depressive disorder, single episode, severe without psychotic features: Secondary | ICD-10-CM

## 2023-12-30 DIAGNOSIS — F1020 Alcohol dependence, uncomplicated: Secondary | ICD-10-CM | POA: Diagnosis not present

## 2023-12-30 NOTE — Patient Instructions (Signed)
Please continue to take medications as prescribed.  Please continue to follow low salt diet and perform moderate exercise/walking at least 150 mins/week. 

## 2023-12-30 NOTE — Assessment & Plan Note (Signed)
 On prazosin  1 mg nightly, Seroquel  200 mg nightly and Paxil  37.5 mg QD Followed by psychiatry

## 2023-12-30 NOTE — Assessment & Plan Note (Signed)
 Needs to avoid alcohol  use Continue Paxil  and gabapentin  Follow-up with psychiatry

## 2023-12-30 NOTE — Assessment & Plan Note (Signed)
 Overall better controlled with Paxil  and quetiapine  Advised to continue follow-up with psychiatry

## 2023-12-30 NOTE — Assessment & Plan Note (Signed)
 He is trying to avoid alcohol  use now, has had only 1 beer since being discharged on 12/21/23 Followed by [facility] therapy
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
## 2024-01-02 ENCOUNTER — Ambulatory Visit (INDEPENDENT_AMBULATORY_CARE_PROVIDER_SITE_OTHER): Payer: MEDICAID

## 2024-01-02 DIAGNOSIS — F250 Schizoaffective disorder, bipolar type: Secondary | ICD-10-CM | POA: Diagnosis not present

## 2024-01-02 DIAGNOSIS — F1020 Alcohol dependence, uncomplicated: Secondary | ICD-10-CM

## 2024-01-02 NOTE — Progress Notes (Signed)
 THERAPIST PROGRESS NOTE  Session Time: 1:00 pm to 1:45 pm  Type of Therapy: Individual   Therapist Response/Interventions: Active listening and psycho-education.   Treatment Goals addressed:     Treatment Goals: Template: Substance Use Disorder Substance Use Treatment Plan 09/27/23 Effective from: 09/27/2023 Effective to: 03/29/2024 Plan ID: [id number] Problem: Substance Use Dates: Start: 09/27/23 Disciplines: Interdisciplinary, PROVIDER Goal: Evan Wilson will report complete abstinence from drugs and alcohol  per self report and weekly UDS while also attending 12 step meetings, obtaining a sponsor and beginning step work per self report Dates: Start: 09/27/23 Expected End: 03/29/24 Disciplines: Interdisciplinary, PROVIDER Goal: Evan Wilson will report a decrease in his depression and anxiety AEB reporting no higher than a 4 on the PHQ-9 and the GAD-7 Dates: Start: 09/27/23 Expected End: 03/29/24 Disciplines: Interdisciplinary, PROVIDER Intervention: Therapist will educate Evan Wilson about SUDS, patterns and consequence of use, relapse risks, the treatment process, types of mutual support groups and provide early recovery and relapse prevention skills Dates: Start: 09/27/23 Intervention: Therapist will assist Evan Wilson in identifying thoughts and behaviors taht can contribute to feelings of depression and anxiety. Dates: Start: 09/27/23 Description: Evan Wilson gives this therapist verbal permission to electronically sign his care plan Outcomes Date/Time User Outcome 09/27/23 1039 Darice SAUNDERS Initial    Summary: Evan Wilson presents in person today.Therapist asks what happened the last time that he came in as he told this therapist that he was not suicidal, however his wife was phoning in saying he was suicidal. Evan Wilson says he did tell his wife he was suicidal because she wouldn't leave him alone, but he did not mean it. Evan Wilson says he did not drive himself to his last therapy appointment and his wife and step son refused to  pick him up so he walked. Home. He says it took him one hour.  He says on the way home, he got confused and guesses he became suicidal.  He says the hospital adjusted his medicine and he is feeling much better. Therapist discusses with him that if he is ever feeling suicidal to please be honest and she will assist in connecting him to the correct level of care.   Evan Wilson says when he was in [place] , Marolyn Rosser MD encouraged him to re-enroll in [facility] IOP.  He says that Evan Ruder MD saw him yesterday told him she wanted him to re-enroll in [facility] IOP.  Evan Wilson says he agrees with their suggestion and thinks he needs more intense treatment in order to stay off alcohol . He says he has only had one beer since our last meeting.  Evan Wilson says he is trying to stay busy and currently gets outside in the afternoons when it gets warmer. He says when he is inside, he is starting to put up Christmas decorations. He explains he and his wife have villages and several other types of decorations.   Progress Towards Goals: progressing  Suicidal/Homicidal: denies  Plan: Return again on 01-13-24 for update for [facility] IOP, complete treatment plan, enroll in [place]  Diagnosis: Schizoaffective Disorder, Mixed, Alcohol  Use Disorder, Severe, PTSD and Generalized Anxiety Disorder.  Collaboration of Care: n/a  Patient/Guardian was advised Release of Information must be obtained prior to any record release in order to collaborate their care with an outside provider. Patient/Guardian was advised if they have not already done so to contact the registration department to sign all necessary forms in order for us  to release information regarding their care.   Consent: Patient/Guardian gives verbal consent  for treatment and assignment of benefits for services provided during this visit. Patient/Guardian expressed understanding and agreed to proceed.   Darice Simpler, MS.  LMFT, LCAS

## 2024-01-08 ENCOUNTER — Ambulatory Visit (HOSPITAL_COMMUNITY): Payer: MEDICAID

## 2024-01-09 ENCOUNTER — Ambulatory Visit (HOSPITAL_COMMUNITY): Payer: MEDICAID

## 2024-01-10 ENCOUNTER — Ambulatory Visit (HOSPITAL_COMMUNITY): Payer: MEDICAID

## 2024-01-13 ENCOUNTER — Ambulatory Visit (HOSPITAL_COMMUNITY): Payer: MEDICAID

## 2024-01-13 ENCOUNTER — Telehealth (INDEPENDENT_AMBULATORY_CARE_PROVIDER_SITE_OTHER): Payer: MEDICAID | Admitting: Registered Nurse

## 2024-01-13 ENCOUNTER — Encounter (HOSPITAL_COMMUNITY): Payer: Self-pay | Admitting: Registered Nurse

## 2024-01-13 DIAGNOSIS — F330 Major depressive disorder, recurrent, mild: Secondary | ICD-10-CM

## 2024-01-13 DIAGNOSIS — F411 Generalized anxiety disorder: Secondary | ICD-10-CM | POA: Diagnosis not present

## 2024-01-13 DIAGNOSIS — F1020 Alcohol dependence, uncomplicated: Secondary | ICD-10-CM

## 2024-01-13 DIAGNOSIS — F250 Schizoaffective disorder, bipolar type: Secondary | ICD-10-CM

## 2024-01-13 DIAGNOSIS — F4310 Post-traumatic stress disorder, unspecified: Secondary | ICD-10-CM

## 2024-01-13 MED ORDER — BACLOFEN 10 MG PO TABS: 10.0000 mg | ORAL_TABLET | Freq: Three times a day (TID) | ORAL | 1 refills | Status: DC

## 2024-01-13 MED ORDER — PAROXETINE HCL ER 12.5 MG PO TB24: 12.5000 mg | ORAL_TABLET | Freq: Every day | ORAL | 1 refills | Status: DC

## 2024-01-13 MED ORDER — PAROXETINE HCL ER 37.5 MG PO TB24: 37.5000 mg | ORAL_TABLET | Freq: Every day | ORAL | 1 refills | Status: DC

## 2024-01-13 MED ORDER — QUETIAPINE FUMARATE 200 MG PO TABS: 200.0000 mg | ORAL_TABLET | Freq: Every day | ORAL | 1 refills | Status: DC

## 2024-01-13 MED ORDER — PRAZOSIN HCL 2 MG PO CAPS: 2.0000 mg | ORAL_CAPSULE | Freq: Every day | ORAL | 1 refills | Status: DC

## 2024-01-13 MED ORDER — GABAPENTIN 400 MG PO CAPS: 400.0000 mg | ORAL_CAPSULE | Freq: Three times a day (TID) | ORAL | 1 refills | Status: DC

## 2024-01-13 MED ORDER — NALTREXONE HCL 50 MG PO TABS: 50.0000 mg | ORAL_TABLET | Freq: Every day | ORAL | 1 refills | Status: DC
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
## 2024-01-13 NOTE — Patient Instructions (Signed)

## 2024-01-15 ENCOUNTER — Ambulatory Visit (HOSPITAL_COMMUNITY): Payer: MEDICAID

## 2024-01-16 ENCOUNTER — Telehealth (HOSPITAL_COMMUNITY): Payer: Self-pay

## 2024-01-16 ENCOUNTER — Ambulatory Visit (HOSPITAL_COMMUNITY): Payer: MEDICAID

## 2024-01-16 NOTE — Telephone Encounter (Signed)
 Evan Wilson was a NS today for his individual appointment.  Because he is high risk, this therapist calls him.  His wife answers and therapist asks to speak to him.  Therapist asks what happened as he NS on Monday for a re-assessment for possible [facility] IOP admission and also No showed today for his individual appointment. He says he has been sick with allergies. Therapist asks him to please call if he is not able to attend his appointments  so as to not be marked as a No show.  Therapist reminds him next Thursday is Thanksgiving and OP services will not be open, however reminded him if he is in crisis, he can go downstairs to the [facility].  He indicates he understands. Therapist tells Evan Wilson she hopes he feels better soon.  Darice Simpler, MS, LMFT, LCAS  01-16-24

## 2024-01-16 NOTE — Telephone Encounter (Signed)
 [place] Mt as he was a NS for his appointment to access him for possible re-admission for [facility] IOP.  Reached VM and left a HIPAA compliant message requesting a return call.  Darice Simpler, MS, LMFT, LCAS 01-13-24

## 2024-01-17 ENCOUNTER — Ambulatory Visit (HOSPITAL_COMMUNITY): Payer: MEDICAID

## 2024-01-20 ENCOUNTER — Ambulatory Visit (HOSPITAL_COMMUNITY): Payer: MEDICAID

## 2024-01-22 ENCOUNTER — Ambulatory Visit (HOSPITAL_COMMUNITY): Payer: MEDICAID

## 2024-01-24 ENCOUNTER — Ambulatory Visit (HOSPITAL_COMMUNITY): Payer: MEDICAID

## 2024-01-27 ENCOUNTER — Ambulatory Visit (HOSPITAL_COMMUNITY): Payer: MEDICAID

## 2024-01-29 ENCOUNTER — Ambulatory Visit (HOSPITAL_COMMUNITY): Payer: MEDICAID

## 2024-01-30 ENCOUNTER — Ambulatory Visit (INDEPENDENT_AMBULATORY_CARE_PROVIDER_SITE_OTHER): Payer: MEDICAID

## 2024-01-30 DIAGNOSIS — F250 Schizoaffective disorder, bipolar type: Secondary | ICD-10-CM

## 2024-01-30 DIAGNOSIS — F411 Generalized anxiety disorder: Secondary | ICD-10-CM

## 2024-01-30 DIAGNOSIS — F4310 Post-traumatic stress disorder, unspecified: Secondary | ICD-10-CM

## 2024-01-30 DIAGNOSIS — F1020 Alcohol dependence, uncomplicated: Secondary | ICD-10-CM
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
## 2024-01-31 ENCOUNTER — Ambulatory Visit (HOSPITAL_COMMUNITY): Payer: MEDICAID

## 2024-02-03 ENCOUNTER — Ambulatory Visit (HOSPITAL_COMMUNITY): Payer: MEDICAID

## 2024-02-05 ENCOUNTER — Ambulatory Visit (HOSPITAL_COMMUNITY): Payer: MEDICAID

## 2024-02-06 ENCOUNTER — Ambulatory Visit (INDEPENDENT_AMBULATORY_CARE_PROVIDER_SITE_OTHER): Payer: MEDICAID

## 2024-02-06 DIAGNOSIS — F411 Generalized anxiety disorder: Secondary | ICD-10-CM

## 2024-02-06 DIAGNOSIS — F1020 Alcohol dependence, uncomplicated: Secondary | ICD-10-CM

## 2024-02-06 DIAGNOSIS — F4310 Post-traumatic stress disorder, unspecified: Secondary | ICD-10-CM
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
## 2024-02-07 ENCOUNTER — Ambulatory Visit (HOSPITAL_COMMUNITY): Payer: MEDICAID

## 2024-02-10 ENCOUNTER — Ambulatory Visit (HOSPITAL_COMMUNITY): Payer: MEDICAID

## 2024-02-11 ENCOUNTER — Ambulatory Visit (HOSPITAL_COMMUNITY): Payer: MEDICAID

## 2024-02-12 ENCOUNTER — Ambulatory Visit (HOSPITAL_COMMUNITY): Payer: MEDICAID

## 2024-02-13 ENCOUNTER — Ambulatory Visit (HOSPITAL_COMMUNITY): Payer: MEDICAID

## 2024-02-14 ENCOUNTER — Ambulatory Visit (HOSPITAL_COMMUNITY): Payer: MEDICAID

## 2024-02-17 ENCOUNTER — Ambulatory Visit (HOSPITAL_COMMUNITY): Payer: MEDICAID

## 2024-02-18 ENCOUNTER — Ambulatory Visit (INDEPENDENT_AMBULATORY_CARE_PROVIDER_SITE_OTHER): Payer: MEDICAID

## 2024-02-18 DIAGNOSIS — F411 Generalized anxiety disorder: Secondary | ICD-10-CM | POA: Diagnosis not present

## 2024-02-18 DIAGNOSIS — F4310 Post-traumatic stress disorder, unspecified: Secondary | ICD-10-CM

## 2024-02-18 DIAGNOSIS — F250 Schizoaffective disorder, bipolar type: Secondary | ICD-10-CM | POA: Diagnosis not present

## 2024-02-18 DIAGNOSIS — F1020 Alcohol dependence, uncomplicated: Secondary | ICD-10-CM

## 2024-02-18 NOTE — Progress Notes (Signed)
 "       THERAPIST PROGRESS NOTE  Session Time: 4:10 pm to 4:45 pm  Virtual Visit via Video Note   I connected with Traven at   4:10 pm  EST by a video enabled telemedicine application and verified that I am speaking with the correct person using two identifiers.   Location: Patient: home Provider: [address] [place]   I discussed the limitations of evaluation and management by telemedicine and the availability of in person appointments. The patient expressed understanding and agreed to proceed.  Therapist Response/Interventions: Active listening/CBT, Discussed how movement is important to deal with trauma that is often stored in the limbic system and brain stem,rather than the prefrontal cortex.  Treatment Goals addressed:     Treatment Goals: Template: Substance Use Disorder Substance Use Treatment Plan 09/27/23 Effective from: 09/27/2023 Effective to: 03/29/2024 Plan ID: [id number] Problem: Substance Use Dates: Start: 09/27/23 Disciplines: Interdisciplinary, PROVIDER Goal: Karriem will report complete abstinence from drugs and alcohol  per self report and weekly UDS while also attending 12 step meetings, obtaining a sponsor and beginning step work per self report Dates: Start: 09/27/23 Expected End: 03/29/24 Disciplines: Interdisciplinary, PROVIDER Goal: Jerick will report a decrease in his depression and anxiety AEB reporting no higher than a 4 on the PHQ-9 and the GAD-7 Dates: Start: 09/27/23 Expected End: 03/29/24 Disciplines: Interdisciplinary, PROVIDER Intervention: Therapist will educate [name] about SUDS, patterns and consequence of use, relapse risks, the treatment process, types of mutual support groups and provide early recovery and relapse prevention skills Dates: Start: 09/27/23 Intervention: Therapist will assist Oliverio in identifying thoughts and behaviors taht can contribute to feelings of depression and anxiety. Dates: Start: 09/27/23 Description: Ermin gives this  therapist verbal permission to electronically sign his care plan Outcomes Date/Time User Outcome 09/27/23 1039 Darice SAUNDERS Initial    Summary: Zyeir presents virtually today. He did not show for in person therapy and front desk called him. Klyde told front desk he has company coming for the holidays and he is helping his wife get ready.  Durk agreed to make the session virtual.  Therapist connects with Jerad virtually and he says he had not thought of making his therapy appointment virtual, but he sees his psychiatrist virtually.   Kaison says his mood is pretty good. He rates his depression and anxiety today as a 3.  He says he thinks he has got his medication straight and is taking it regularly.  Leroy says he has only had a couple of zero alcohol  beers since we last met.  Tharon says he is noticing more problems with his memory. He did not remember what his therapy homework was from last week.  Carlos says that he needs an EEG and he is going to have his wife talk to his Medication provider on the next visit and ask if she will order one.  He says he may have early onset dementia.  Cloy says his children are out of school for the holidays. Therapist asks how they are entertaining themselves. Mitchael says they go with him to the park to take walks.  Therapist discusses how movement is important in the treatment of PTSD, particularly when one has no memory of the traumatic event.    Progress Towards Goals: minimal progress  Suicidal/Homicidal: denies  Plan: Return again for individual therapy on 02-25-24 at 4pm   Diagnosis: Schizoaffective Disorder, Mixed, Alcohol  Use Disorder, Severe, PTSD and Generalized Anxiety Disorder.  Collaboration of Care: n/a  Patient/Guardian was advised  Release of Information must be obtained prior to any record release in order to collaborate their care with an outside provider. Patient/Guardian was advised if they have not already done so to contact the registration department  to sign all necessary forms in order for us  to release information regarding their care.   Consent: Patient/Guardian gives verbal consent for treatment and assignment of benefits for services provided during this visit. Patient/Guardian expressed understanding and agreed to proceed.   Darice Simpler, MS.  LMFT, LCAS  "

## 2024-02-21 ENCOUNTER — Ambulatory Visit (HOSPITAL_COMMUNITY): Payer: MEDICAID

## 2024-02-24 ENCOUNTER — Ambulatory Visit (HOSPITAL_COMMUNITY): Payer: MEDICAID

## 2024-02-25 ENCOUNTER — Ambulatory Visit (HOSPITAL_COMMUNITY): Payer: MEDICAID

## 2024-02-25 DIAGNOSIS — F411 Generalized anxiety disorder: Secondary | ICD-10-CM | POA: Diagnosis not present

## 2024-02-25 DIAGNOSIS — F250 Schizoaffective disorder, bipolar type: Secondary | ICD-10-CM

## 2024-02-25 DIAGNOSIS — F4310 Post-traumatic stress disorder, unspecified: Secondary | ICD-10-CM

## 2024-02-25 DIAGNOSIS — F1020 Alcohol dependence, uncomplicated: Secondary | ICD-10-CM | POA: Diagnosis not present

## 2024-02-25 DIAGNOSIS — F259 Schizoaffective disorder, unspecified: Secondary | ICD-10-CM | POA: Diagnosis not present

## 2024-02-25 NOTE — Progress Notes (Signed)
 "       THERAPIST PROGRESS NOTE  Session Time: 4:00 pm to 4:45 pm   Virtual Visit via Video Note   I connected with Evan Wilson at 4:00 pm  EST by a video enabled telemedicine application and verified that I am speaking with the correct person using two identifiers.   Location: Patient: home Provider: [address] [place]   I discussed the limitations of evaluation and management by telemedicine and the availability of in person appointments. The patient expressed understanding and agreed to proceed.  Therapist Response/Interventions: Active listening/CBT, checked on progress with using grounding techniques and the importance of using them on an ongoing basis, motivational interviewing to explore Evan Wilson's motivation issues regarding stopping of his alcohol  use, discussed health problems that can result from long term use of alcohol   Treatment Goals addressed:     Treatment Goals: Template: Substance Use Disorder Substance Use Treatment Plan 09/27/23 Effective from: 09/27/2023 Effective to: 03/29/2024 Plan ID: [id number] Problem: Substance Use Dates: Start: 09/27/23 Disciplines: Interdisciplinary, PROVIDER Goal: Evan Wilson will report complete abstinence from drugs and alcohol  per self report and weekly UDS while also attending 12 step meetings, obtaining a sponsor and beginning step work per self report Dates: Start: 09/27/23 Expected End: 03/29/24 Disciplines: Interdisciplinary, PROVIDER Goal: Evan Wilson will report a decrease in his depression and anxiety AEB reporting no higher than a 4 on the PHQ-9 and the GAD-7 Dates: Start: 09/27/23 Expected End: 03/29/24 Disciplines: Interdisciplinary, PROVIDER Intervention: Therapist will educate Evan Wilson about SUDS, patterns and consequence of use, relapse risks, the treatment process, types of mutual support groups and provide early recovery and relapse prevention skills Dates: Start: 09/27/23 Intervention: Therapist will assist Evan Wilson in identifying thoughts and  behaviors taht can contribute to feelings of depression and anxiety. Dates: Start: 09/27/23 Description: Evan Wilson gives this therapist verbal permission to electronically sign his care plan Outcomes Date/Time User Outcome 09/27/23 1039 Evan Wilson Initial    Summary: Evan Wilson presents virtually today. He called the front desk and asked to be made virtual for this appointment.  Evan Wilson rates his anxiety and depression today as a 3.  Evan Wilson says his family had a good Christmas. He said he had the neighbors next door over on Sunday and they cooked Prime Rib.  He says he and his wife are going to finish it up and make a salad with prime rib on top.   Therapist asks if Evan Wilson has been using grounding techniques. He says it is not helping his memory. Therapist explains the grounding techniques are to assist in lowering his anxiety. He says he thinks the grounding techniques are working a little bit.   Therapist asks Evan Wilson if he is having any nightmares. He says he is falling off the bed. He says his wife says it is happening 3 times per week.  He says his wife says he is jumpy at night sometimes.  Evan Wilson says he has drank 3 beers since last session.  Evan Wilson says he is thinking about quitting alcohol  use. Therapist explores what Evan Wilson is willing to do to stop drinking.  Evan Wilson says he wants to stop drinking but is not sure if he can. Therapist suggests that Evan Wilson check out [employer] online to start off with.  Evan Wilson says he needs to cancel his next therapy appt as he has a conflicting [facility] appt.    Progress Towards Goals: progress  Suicidal/Homicidal: denies  Plan: Return again for individual therapy on 03-17-24 at 1pm   Diagnosis: Schizoaffective  Disorder, Mixed, Alcohol  Use Disorder, Severe, PTSD and Generalized Anxiety Disorder.  Collaboration of Care: n/a  Patient/Guardian was advised Release of Information must be obtained prior to any record release in order to collaborate their care with an outside  provider. Patient/Guardian was advised if they have not already done so to contact the registration department to sign all necessary forms in order for us  to release information regarding their care.   Consent: Patient/Guardian gives verbal consent for treatment and assignment of benefits for services provided during this visit. Patient/Guardian expressed understanding and agreed to proceed.   Evan Simpler, MS.  LMFT, LCAS  "

## 2024-02-26 ENCOUNTER — Ambulatory Visit (HOSPITAL_COMMUNITY): Payer: MEDICAID

## 2024-03-03 ENCOUNTER — Ambulatory Visit (HOSPITAL_COMMUNITY): Payer: MEDICAID

## 2024-03-03 ENCOUNTER — Ambulatory Visit (INDEPENDENT_AMBULATORY_CARE_PROVIDER_SITE_OTHER): Payer: MEDICAID | Admitting: Podiatry

## 2024-03-03 DIAGNOSIS — M722 Plantar fascial fibromatosis: Secondary | ICD-10-CM | POA: Diagnosis not present

## 2024-03-03 MED ORDER — TRIAMCINOLONE ACETONIDE 40 MG/ML IJ SUSP
40.0000 mg | Freq: Once | INTRAMUSCULAR | Status: AC
  Administered 2024-03-03: 40 mg

## 2024-03-03 NOTE — Progress Notes (Signed)
 Patient presents complaining of pain at the heel right.  Had been doing better but is flared back up again.  Has been getting some pain in the lateral aspect of the foot indicating the base of the fifth metatarsal.  Does not recall any injury to the foot.  Has not noticed any redness ecchymosis or swelling.   Physical exam:  General appearance: Pleasant, and in no acute distress. AOx3.  Vascular: Pedal pulses: DP 2/4 bilaterally, PT 2/4 bilaterally. No edema lower legs bilaterally. Capillary fill time immediate bilaterally.  Neurological: Light touch intact feet bilaterally.  Normal Achilles reflex bilaterally.  No clonus or spasticity noted.  Negative Tinel sign tarsal tunnel and porta pedis.  Dermatologic:   Skin normal temperature bilaterally.  Skin normal color, tone, and texture bilaterally.   Musculoskeletal: Tenderness plantar medial aspect heel at medial plantar Plantar calcaneal tubercle.  Some tenderness in the middle one third of the plantar fascia right.  Some tenderness at the peroneus brevis insertion right    Diagnosis: 1.  Plantar fasciitis right  Plan: -Discussed recurrence of plantar fasciitis on the right.  Will try an injection.  Also recommended RICE.  Also do stretching exercises. -injected 3cc 2:1 mixture 0.5 cc Marcaine : Triamcinolone  40mg /96ml at medial origin of plantar fascia at medial plantar calcaneal tubercle right.    Return 2 weeks follow-up injection plantar fascia right

## 2024-03-05 ENCOUNTER — Ambulatory Visit: Payer: Self-pay

## 2024-03-05 ENCOUNTER — Telehealth (INDEPENDENT_AMBULATORY_CARE_PROVIDER_SITE_OTHER): Payer: MEDICAID | Admitting: Registered Nurse

## 2024-03-05 ENCOUNTER — Encounter (HOSPITAL_COMMUNITY): Payer: Self-pay | Admitting: Registered Nurse

## 2024-03-05 DIAGNOSIS — F411 Generalized anxiety disorder: Secondary | ICD-10-CM

## 2024-03-05 DIAGNOSIS — F250 Schizoaffective disorder, bipolar type: Secondary | ICD-10-CM

## 2024-03-05 DIAGNOSIS — F1020 Alcohol dependence, uncomplicated: Secondary | ICD-10-CM | POA: Diagnosis not present

## 2024-03-05 DIAGNOSIS — F4310 Post-traumatic stress disorder, unspecified: Secondary | ICD-10-CM

## 2024-03-05 DIAGNOSIS — F330 Major depressive disorder, recurrent, mild: Secondary | ICD-10-CM

## 2024-03-05 MED ORDER — NALTREXONE HCL 50 MG PO TABS: 50.0000 mg | ORAL_TABLET | Freq: Every day | ORAL | 1 refills | Status: AC

## 2024-03-05 MED ORDER — BACLOFEN 10 MG PO TABS: 10.0000 mg | ORAL_TABLET | Freq: Three times a day (TID) | ORAL | 1 refills | Status: AC

## 2024-03-05 MED ORDER — PRAZOSIN HCL 2 MG PO CAPS: 2.0000 mg | ORAL_CAPSULE | Freq: Every day | ORAL | 1 refills | Status: AC

## 2024-03-05 MED ORDER — PAROXETINE HCL ER 12.5 MG PO TB24: 12.5000 mg | ORAL_TABLET | Freq: Every day | ORAL | 1 refills | Status: AC

## 2024-03-05 MED ORDER — PAROXETINE HCL ER 37.5 MG PO TB24: 37.5000 mg | ORAL_TABLET | Freq: Every day | ORAL | 1 refills | Status: AC

## 2024-03-05 MED ORDER — QUETIAPINE FUMARATE 200 MG PO TABS: 200.0000 mg | ORAL_TABLET | Freq: Every day | ORAL | 1 refills | Status: AC

## 2024-03-05 MED ORDER — GABAPENTIN 400 MG PO CAPS: 400.0000 mg | ORAL_CAPSULE | Freq: Three times a day (TID) | ORAL | 1 refills | Status: AC
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
## 2024-03-05 NOTE — Patient Instructions (Signed)
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
## 2024-03-06 ENCOUNTER — Ambulatory Visit: Payer: MEDICAID | Admitting: Family Medicine

## 2024-03-06 ENCOUNTER — Encounter: Payer: Self-pay | Admitting: Family Medicine

## 2024-03-06 VITALS — BP 144/81 | HR 95 | Resp 18 | Ht 71.0 in | Wt 240.1 lb

## 2024-03-06 DIAGNOSIS — R6889 Other general symptoms and signs: Secondary | ICD-10-CM

## 2024-03-06 DIAGNOSIS — E559 Vitamin D deficiency, unspecified: Secondary | ICD-10-CM

## 2024-03-06 DIAGNOSIS — F1020 Alcohol dependence, uncomplicated: Secondary | ICD-10-CM | POA: Diagnosis not present

## 2024-03-06 DIAGNOSIS — Z23 Encounter for immunization: Secondary | ICD-10-CM

## 2024-03-06 DIAGNOSIS — Z1329 Encounter for screening for other suspected endocrine disorder: Secondary | ICD-10-CM | POA: Diagnosis not present

## 2024-03-06 DIAGNOSIS — Z818 Family history of other mental and behavioral disorders: Secondary | ICD-10-CM

## 2024-03-06 NOTE — Patient Instructions (Addendum)
 F/u in 8 weeks , nurse visit hepatitis #2  F/U in office with Provider in 4 months  You are being referred for brain scan as well as to see Neurologist  You are being referred to GI for colonoscopy in [place]  CBC, TSH, cmp and EGFR and vit D today  Continue to work on abstaining from alcohol  and keep mental jhealth appointments  Thanks for choosing [facility], we consider it a privelige to serve you.

## 2024-03-06 NOTE — Progress Notes (Addendum)
" ° °  Evan Wilson     MRN: [id number]      DOB: 1975/07/11  Chief Complaint  Patient presents with   Memory Loss    Pt complains of worsening memory loss the last couple months. Was advised by his counselor to see pcp for referral      HPI Evan Wilson is here because of significant memory loss noted to be worsening in the past 2 monhts. No recent head trauma. States he will get lost without using GPS If he does not write down what he is out to buy, even 3 objects he will forget them Losing keys in his home and his wallet  Mother had dementia in her 42's ROS Denies recent fever or chills. Denies sinus pressure, nasal congestion, ear pain or sore throat. Denies chest congestion, productive cough or wheezing. Denies chest pains, palpitations and leg swelling Denies abdominal pain, nausea, vomiting,diarrhea or constipation.   Denies dysuria, frequency, hesitancy or incontinence. Denies joint pain, swelling and limitation in mobility. Denies headaches, seizures, numbness, or tingling. Denies depression, anxiety or insomnia. Denies skin break down or rash.   PE  BP (!) 144/81   Pulse 95   Resp 18   Ht 5' 11 (1.803 m)   Wt 240 lb 1.3 oz (108.9 kg)   SpO2 96%   BMI 33.48 kg/m   Patient alert and oriented and in no cardiopulmonary distress.  MMSE 24 on day of visit as documented  HEENT: No facial asymmetry, EOMI,     Neck supple .  Chest: Clear to auscultation bilaterally.  CVS: S1, S2 no murmurs, no S3.Regular rate.  ABD: Soft non tender.   Ext: No edema  MS: Adequate ROM spine, shoulders, hips and knees.  Skin: Intact, no ulcerations or rash noted.  Psych: Good eye contact, normal affect. Memory intact not anxious or depressed appearing.  CNS: CN 2-12 intact, power,  normal throughout.no focal deficits noted.   Assessment & Plan Forgetfulness Worsening memory loss and confusion reported x 6 monhts, alchol dependence and f/h of dementia  MRI brain and refer  to Neurology MMSE is 24 on day of visit  Severe alcohol  use disorder (HCC) Elevated LFT on day of visit suggesting increased use currnetly Treated by Psych  Encouraged to abstain  Immunization due After obtaining informed consent, the hep B #1  vaccine is  administered , with no adverse effect noted at the time of administration.   "

## 2024-03-07 ENCOUNTER — Ambulatory Visit: Payer: Self-pay | Admitting: Family Medicine

## 2024-03-07 LAB — CBC WITH DIFFERENTIAL/PLATELET
Basophils Absolute: 0.1 x10E3/uL (ref 0.0–0.2)
Basos: 1 %
EOS (ABSOLUTE): 0.1 x10E3/uL (ref 0.0–0.4)
Eos: 1 %
Hematocrit: 38.9 % (ref 37.5–51.0)
Hemoglobin: 12.8 g/dL — ABNORMAL LOW (ref 13.0–17.7)
Immature Grans (Abs): 0.1 x10E3/uL (ref 0.0–0.1)
Immature Granulocytes: 1 %
Lymphocytes Absolute: 2 x10E3/uL (ref 0.7–3.1)
Lymphs: 18 %
MCH: 29.2 pg (ref 26.6–33.0)
MCHC: 32.9 g/dL (ref 31.5–35.7)
MCV: 89 fL (ref 79–97)
Monocytes Absolute: 1.2 x10E3/uL — ABNORMAL HIGH (ref 0.1–0.9)
Monocytes: 11 %
Neutrophils Absolute: 7.5 x10E3/uL — ABNORMAL HIGH (ref 1.4–7.0)
Neutrophils: 68 %
Platelets: 182 x10E3/uL (ref 150–450)
RBC: 4.39 x10E6/uL (ref 4.14–5.80)
RDW: 17.5 % — ABNORMAL HIGH (ref 11.6–15.4)
WBC: 10.8 x10E3/uL (ref 3.4–10.8)

## 2024-03-07 LAB — CMP14+EGFR
ALT: 76 IU/L — ABNORMAL HIGH (ref 0–44)
AST: 113 IU/L — ABNORMAL HIGH (ref 0–40)
Albumin: 4.1 g/dL (ref 4.1–5.1)
Alkaline Phosphatase: 161 IU/L — ABNORMAL HIGH (ref 47–123)
BUN/Creatinine Ratio: 8 — ABNORMAL LOW (ref 9–20)
BUN: 6 mg/dL (ref 6–24)
Bilirubin Total: 0.7 mg/dL (ref 0.0–1.2)
CO2: 24 mmol/L (ref 20–29)
Calcium: 9 mg/dL (ref 8.7–10.2)
Chloride: 104 mmol/L (ref 96–106)
Creatinine, Ser: 0.76 mg/dL (ref 0.76–1.27)
Globulin, Total: 3.7 g/dL (ref 1.5–4.5)
Glucose: 119 mg/dL — ABNORMAL HIGH (ref 70–99)
Potassium: 4.2 mmol/L (ref 3.5–5.2)
Sodium: 141 mmol/L (ref 134–144)
Total Protein: 7.8 g/dL (ref 6.0–8.5)
eGFR: 112 mL/min/1.73

## 2024-03-07 LAB — TSH: TSH: 0.774 u[IU]/mL (ref 0.450–4.500)

## 2024-03-07 LAB — VITAMIN D 25 HYDROXY (VIT D DEFICIENCY, FRACTURES): Vit D, 25-Hydroxy: 36.9 ng/mL (ref 30.0–100.0)

## 2024-03-10 ENCOUNTER — Ambulatory Visit (HOSPITAL_COMMUNITY): Payer: MEDICAID

## 2024-03-10 ENCOUNTER — Encounter: Payer: Self-pay | Admitting: Family Medicine

## 2024-03-10 DIAGNOSIS — Z1329 Encounter for screening for other suspected endocrine disorder: Secondary | ICD-10-CM | POA: Insufficient documentation

## 2024-03-10 DIAGNOSIS — Z23 Encounter for immunization: Secondary | ICD-10-CM | POA: Insufficient documentation

## 2024-03-10 DIAGNOSIS — E559 Vitamin D deficiency, unspecified: Secondary | ICD-10-CM | POA: Insufficient documentation

## 2024-03-10 DIAGNOSIS — R6889 Other general symptoms and signs: Secondary | ICD-10-CM | POA: Insufficient documentation

## 2024-03-10 NOTE — Assessment & Plan Note (Signed)
 Elevated LFT on day of visit suggesting increased use currnetly Treated by Psych  Encouraged to abstain

## 2024-03-10 NOTE — Assessment & Plan Note (Signed)
 After obtaining informed consent, the hep B #1vaccine is  administered , with no adverse effect noted at the time of administration.

## 2024-03-10 NOTE — Progress Notes (Unsigned)
 "       THERAPIST PROGRESS NOTE  Session Time: 4:00 pm to 4:45 pm   Virtual Visit via Video Note   I connected with Evan Wilson at 4:00 pm  EST by a video enabled telemedicine application and verified that I am speaking with the correct person using two identifiers.   Location: Patient: home Provider: [address] [place]   I discussed the limitations of evaluation and management by telemedicine and the availability of in person appointments. The patient expressed understanding and agreed to proceed.  Therapist Response/Interventions: Active listening/CBT, checked on progress with using grounding techniques and the importance of using them on an ongoing basis, motivational interviewing to explore Evan Wilson's motivation issues regarding stopping of his alcohol  use, discussed health problems that can result from long term use of alcohol   Treatment Goals addressed:     Treatment Goals: Template: Substance Use Disorder Substance Use Treatment Plan 09/27/23 Effective from: 09/27/2023 Effective to: 03/29/2024 Plan ID: [id number] Problem: Substance Use Dates: Start: 09/27/23 Disciplines: Interdisciplinary, PROVIDER Goal: Evan Wilson will report complete abstinence from drugs and alcohol  per self report and weekly UDS while also attending 12 step meetings, obtaining a sponsor and beginning step work per self report Dates: Start: 09/27/23 Expected End: 03/29/24 Disciplines: Interdisciplinary, PROVIDER Goal: Evan Wilson will report a decrease in his depression and anxiety AEB reporting no higher than a 4 on the PHQ-9 and the GAD-7 Dates: Start: 09/27/23 Expected End: 03/29/24 Disciplines: Interdisciplinary, PROVIDER Intervention: Therapist will educate Evan Wilson about SUDS, patterns and consequence of use, relapse risks, the treatment process, types of mutual support groups and provide early recovery and relapse prevention skills Dates: Start: 09/27/23 Intervention: Therapist will assist Evan Wilson in identifying thoughts and  behaviors taht can contribute to feelings of depression and anxiety. Dates: Start: 09/27/23 Description: Evan Wilson gives this therapist verbal permission to electronically sign his care plan Outcomes Date/Time User Outcome 09/27/23 1039 Evan SAUNDERS Initial    Summary: Evan Wilson presents virtually today. He called the front desk and asked to be made virtual for this appointment.  Evan Wilson rates his anxiety and depression today as a 3.  Evan Wilson says his family had a good Christmas. He said he had the neighbors next door over on Sunday and they cooked Prime Rib.  He says he and his wife are going to finish it up and make a salad with prime rib on top.   Therapist asks if Evan Wilson has been using grounding techniques. He says it is not helping his memory. Therapist explains the grounding techniques are to assist in lowering his anxiety. He says he thinks the grounding techniques are working a little bit.   Therapist asks Evan Wilson if he is having any nightmares. He says he is falling off the bed. He says his wife says it is happening 3 times per week.  He says his wife says he is jumpy at night sometimes.  Evan Wilson says he has drank 3 beers since last session.  Evan Wilson says he is thinking about quitting alcohol  use. Therapist explores what Evan Wilson is willing to do to stop drinking.  Evan Wilson says he wants to stop drinking but is not sure if he can. Therapist suggests that Evan Wilson check out [employer] online to start off with.  Evan Wilson says he needs to cancel his next therapy appt as he has a conflicting [facility] appt.    Progress Towards Goals: progress  Suicidal/Homicidal: denies  Plan: Return again for individual therapy on 03-17-24 at 1pm   Diagnosis: Schizoaffective  Disorder, Mixed, Alcohol  Use Disorder, Severe, PTSD and Generalized Anxiety Disorder.  Collaboration of Care: n/a  Patient/Guardian was advised Release of Information must be obtained prior to any record release in order to collaborate their care with an outside  provider. Patient/Guardian was advised if they have not already done so to contact the registration department to sign all necessary forms in order for us  to release information regarding their care.   Consent: Patient/Guardian gives verbal consent for treatment and assignment of benefits for services provided during this visit. Patient/Guardian expressed understanding and agreed to proceed.   Evan Simpler, MS.  LMFT, LCAS  "

## 2024-03-10 NOTE — Assessment & Plan Note (Addendum)
 Worsening memory loss and confusion reported x 6 monhts, alchol dependence and f/h of dementia  MRI brain and refer to Neurology MMSE is 24 on day of visit

## 2024-03-10 NOTE — Progress Notes (Unsigned)
 "       THERAPIST PROGRESS NOTE  Session Time: 4:00 pm to 4:45 pm   Virtual Visit via Video Note   I connected with Evan Wilson at 4:00 pm  EST by a video enabled telemedicine application and verified that I am speaking with the correct person using two identifiers.   Location: Patient: home Provider: [address] [place]   I discussed the limitations of evaluation and management by telemedicine and the availability of in person appointments. The patient expressed understanding and agreed to proceed.  Therapist Response/Interventions: Active listening/CBT, checked on progress with using grounding techniques and the importance of using them on an ongoing basis, motivational interviewing to explore Evan Wilson's motivation issues regarding stopping of his alcohol  use, discussed health problems that can result from long term use of alcohol   Treatment Goals addressed:     Treatment Goals: Template: Substance Use Disorder Substance Use Treatment Plan 09/27/23 Effective from: 09/27/2023 Effective to: 03/29/2024 Plan ID: [id number] Problem: Substance Use Dates: Start: 09/27/23 Disciplines: Interdisciplinary, PROVIDER Goal: Evan Wilson will report complete abstinence from drugs and alcohol  per self report and weekly UDS while also attending 12 step meetings, obtaining a sponsor and beginning step work per self report Dates: Start: 09/27/23 Expected End: 03/29/24 Disciplines: Interdisciplinary, PROVIDER Goal: Evan Wilson will report a decrease in his depression and anxiety AEB reporting no higher than a 4 on the PHQ-9 and the GAD-7 Dates: Start: 09/27/23 Expected End: 03/29/24 Disciplines: Interdisciplinary, PROVIDER Intervention: Therapist will educate Evan Wilson about SUDS, patterns and consequence of use, relapse risks, the treatment process, types of mutual support groups and provide early recovery and relapse prevention skills Dates: Start: 09/27/23 Intervention: Therapist will assist Evan Wilson in identifying thoughts and  behaviors taht can contribute to feelings of depression and anxiety. Dates: Start: 09/27/23 Description: Evan Wilson gives this therapist verbal permission to electronically sign his care plan Outcomes Date/Time User Outcome 09/27/23 1039 Darice SAUNDERS Initial    Summary: Evan Wilson presents virtually today. He called the front desk and asked to be made virtual for this appointment.  Evan Wilson rates his anxiety and depression today as a 3.  Evan Wilson says his family had a good Christmas. He said he had the neighbors next door over on Sunday and they cooked Prime Rib.  He says he and his wife are going to finish it up and make a salad with prime rib on top.   Therapist asks if Evan Wilson has been using grounding techniques. He says it is not helping his memory. Therapist explains the grounding techniques are to assist in lowering his anxiety. He says he thinks the grounding techniques are working a little bit.   Therapist asks Evan Wilson if he is having any nightmares. He says he is falling off the bed. He says his wife says it is happening 3 times per week.  He says his wife says he is jumpy at night sometimes.  Evan Wilson says he has drank 3 beers since last session.  Evan Wilson says he is thinking about quitting alcohol  use. Therapist explores what Evan Wilson is willing to do to stop drinking.  Evan Wilson says he wants to stop drinking but is not sure if he can. Therapist suggests that Evan Wilson check out [employer] online to start off with.  Evan Wilson says he needs to cancel his next therapy Wilson as he has a conflicting Evan Wilson.    Progress Towards Goals: progress  Suicidal/Homicidal: denies  Plan: Return again for individual therapy on 03-17-24 at 1pm   Diagnosis: Schizoaffective  Disorder, Mixed, Alcohol  Use Disorder, Severe, PTSD and Generalized Anxiety Disorder.  Collaboration of Care: n/a  Patient/Guardian was advised Release of Information must be obtained prior to any record release in order to collaborate their care with an outside  provider. Patient/Guardian was advised if they have not already done so to contact the registration department to sign all necessary forms in order for us  to release information regarding their care.   Consent: Patient/Guardian gives verbal consent for treatment and assignment of benefits for services provided during this visit. Patient/Guardian expressed understanding and agreed to proceed.   Darice Simpler, MS.  LMFT, LCAS  "

## 2024-03-12 ENCOUNTER — Telehealth (HOSPITAL_COMMUNITY): Payer: Self-pay | Admitting: *Deleted

## 2024-03-12 NOTE — Telephone Encounter (Signed)
Called patient and informed him with what provider stated and he verbalized understanding and agreed with advise.

## 2024-03-12 NOTE — Telephone Encounter (Signed)
 Per pt he would like to know why he's not getting his refills for his Zoloft . Per pt he's been without it for about 2 weeks. Per pt, provider up'ed his zoloft  before the holidays happened to taking 1.5 tablets. Per pt chart, this medication is not on his list. Per pt medication history, it was d/c during August discharge from the hospital. Per pt, provider told him to not stop taking it.   Per pt if he is suppose to be taking this medication he would like more refills send to pharmacy. Patient was last seen 03-05-24. Per pt if he's not suppose to be taking this mediation, what did provider switched him to?

## 2024-03-17 ENCOUNTER — Encounter (HOSPITAL_COMMUNITY): Payer: Self-pay

## 2024-03-17 ENCOUNTER — Telehealth (HOSPITAL_COMMUNITY): Payer: Self-pay

## 2024-03-17 ENCOUNTER — Ambulatory Visit (HOSPITAL_COMMUNITY): Payer: MEDICAID

## 2024-03-17 ENCOUNTER — Ambulatory Visit: Payer: MEDICAID | Admitting: Podiatry

## 2024-03-17 DIAGNOSIS — M722 Plantar fascial fibromatosis: Secondary | ICD-10-CM

## 2024-03-17 DIAGNOSIS — F250 Schizoaffective disorder, bipolar type: Secondary | ICD-10-CM

## 2024-03-17 MED ORDER — TRIAMCINOLONE ACETONIDE 10 MG/ML IJ SUSP
10.0000 mg | Freq: Once | INTRAMUSCULAR | Status: AC
  Administered 2024-03-17: 10 mg

## 2024-03-17 NOTE — Telephone Encounter (Signed)
 NOTE: Therapist did not send the letter for Evan Wilson to his probation officer FOR 03-17-24.  Darice Simpler, MS, LMFT, LCAS

## 2024-03-17 NOTE — Progress Notes (Addendum)
 "        THERAPIST PROGRESS NOTE  Session Time: 4:00 pm to 4:45 pm   Virtual Visit via Video Note   I connected with Anshul at 4:00 pm  EST by a video enabled telemedicine application and verified that I am speaking with the correct person using two identifiers.   Location: Patient: home Provider: [address] [place]   I discussed the limitations of evaluation and management by telemedicine and the availability of in person appointments. The patient expressed understanding and agreed to proceed.  Therapist Response/Interventions: Active listening/congratulated Emoni on his decrease of alcohol  to one beer since 02/25/24/encouraged him to continue to use his coping skills to distract his emotions or thought in the relapse cycle.  Treatment Goals addressed:     Treatment Goals: Template: Substance Use Disorder Substance Use Treatment Plan 09/27/23 Effective from: 09/27/2023 Effective to: 03/29/2024 Plan ID: [id number] Problem: Substance Use Dates: Start: 09/27/23 Disciplines: Interdisciplinary, PROVIDER Goal: Neeko will report complete abstinence from drugs and alcohol  per self report and weekly UDS while also attending 12 step meetings, obtaining a sponsor and beginning step work per self report Dates: Start: 09/27/23 Expected End: 03/29/24 Disciplines: Interdisciplinary, PROVIDER Goal: Rajesh will report a decrease in his depression and anxiety AEB reporting no higher than a 4 on the PHQ-9 and the GAD-7 Dates: Start: 09/27/23 Expected End: 03/29/24 Disciplines: Interdisciplinary, PROVIDER Intervention: Therapist will educate [name] about SUDS, patterns and consequence of use, relapse risks, the treatment process, types of mutual support groups and provide early recovery and relapse prevention skills Dates: Start: 09/27/23 Intervention: Therapist will assist Esley in identifying thoughts and behaviors taht can contribute to feelings of depression and anxiety. Dates: Start:  09/27/23 Description: Han gives this therapist verbal permission to electronically sign his care plan Outcomes Date/Time User Outcome 09/27/23 1039 Darice SAUNDERS Initial    Summary: Emoni presents virtually today.He says he called front desk because his wife has the flu and did not feel like driving him to the office. He says he got in touch with the court and they got him hooked up with [employer] to do community service. Judge rates his anxiety and depression as a 5 today.  He says he needs a letter stating when he was in the hospital to give to his probation office whose name is [employer]. He says that his prescriber  stopped his Zoloft  to see if he can manage without it. He says his wife says he has more energy and is less cranky since stopping the Zoloft . Darrion says he continues to have memory problems. Therapist inquires if he is using lists to assist him and he says that he is.  Maximilian reports he has drank one beer since the last therapy session. Keland agrees to come for an in person therapy session on 03-26-24.  Progress Towards Goals: progress  Suicidal/Homicidal: denies  Plan: Return again for individual therapy in person on 03-26-24 at 1pm   Diagnosis: Schizoaffective Disorder, Mixed, Alcohol  Use Disorder, Severe, PTSD and Generalized Anxiety Disorder.  Collaboration of Care: n/a  Patient/Guardian was advised Release of Information must be obtained prior to any record release in order to collaborate their care with an outside provider. Patient/Guardian was advised if they have not already done so to contact the registration department to sign all necessary forms in order for us  to release information regarding their care.   Consent: Patient/Guardian gives verbal consent for treatment and assignment of benefits for services provided during this  visit. Patient/Guardian expressed understanding and agreed to proceed.   Darice Simpler, MS.  LMFT, LCAS   "

## 2024-03-17 NOTE — Progress Notes (Signed)
 This for follow-up injection plantar fascia at heel right foot.  Says got a few days of relief but now pain is back to about the same level.   Physical exam:  General appearance: Pleasant, and in no acute distress. AOx3.  Vascular: Pedal pulses: DP 2/4 bilaterally, PT 2/4 bilaterally.  Minimal edema lower legs bilaterally. Capillary fill time immediate bilaterally.  Neurological: Negative Tinel sign tarsal tunnel and porta pedis right  Dermatologic:   Skin normal temperature bilaterally.  Skin normal color, tone, and texture bilaterally.   Musculoskeletal: Tenderness plantar medial aspect heel right at medial plantar calcaneal tubercle.  Some tenderness along peroneus brevis right at the base of the fifth metatarsal    Diagnosis: 1.  Plantar fasciitis right  Plan: -Still symptomatic we will try second injection today.  The peroneus brevis tendinitis is probably from compensation for the heel pain - Injected 3cc 2:1 mixture 0.5 cc Marcaine : Triamcinolone  40mg /7ml at plantar fascial origin at medial plantar calcaneal tubercle right.     Return 2 weeks follow-up injection plantar fascia right

## 2024-03-26 ENCOUNTER — Ambulatory Visit (HOSPITAL_COMMUNITY): Payer: MEDICAID

## 2024-03-27 NOTE — Telephone Encounter (Signed)
 Ps let pt know that the insurance has denied him having a brain scan, he sjhould still be getting an appointment with Neurologist, and hopefully at that time the Neurologist may be able to get the scan approved (This is a f/u from a visit he had with me earlier this month when scan was requested)

## 2024-03-30 ENCOUNTER — Telehealth: Payer: Self-pay | Admitting: Family Medicine

## 2024-03-30 NOTE — Telephone Encounter (Unsigned)
 Copied from CRM #[id number]. Topic: Referral - Question >> Mar 30, 2024  1:09 PM Amber H wrote: Reason for CRM: Patient stated his insurance has denied his referrals to [facility] and his EKG due to needing more information as to why referrals are needed. Confirmed he still has [employer] for his insurance. Wanted to know how to handle this.   [id number]

## 2024-03-31 NOTE — Telephone Encounter (Signed)
 I am not sure how Insurance is denying a claim when the Patient has not been seen - Patient Neurology Appt is not until 07/27/2024.  For the ECHO - I do not show where we have ordered that test. At this time I do not see any ECHO order within [facility].

## 2024-04-02 ENCOUNTER — Encounter: Payer: Self-pay | Admitting: Podiatry

## 2024-04-02 ENCOUNTER — Ambulatory Visit (HOSPITAL_COMMUNITY): Payer: MEDICAID

## 2024-04-02 ENCOUNTER — Ambulatory Visit: Payer: MEDICAID | Admitting: Podiatry

## 2024-04-02 ENCOUNTER — Telehealth (HOSPITAL_COMMUNITY): Payer: Self-pay

## 2024-04-02 DIAGNOSIS — M722 Plantar fascial fibromatosis: Secondary | ICD-10-CM

## 2024-04-02 MED ORDER — PREDNISONE 5 MG PO TABS: ORAL_TABLET | ORAL | 1 refills | Status: AC

## 2024-04-02 MED ORDER — TRIAMCINOLONE ACETONIDE 10 MG/ML IJ SUSP
10.0000 mg | Freq: Once | INTRAMUSCULAR | Status: AC
  Administered 2024-04-02: 10 mg

## 2024-04-02 NOTE — Telephone Encounter (Signed)
 Therapist calls Evan Wilson as he did not show for his therapy appointment. [name] answers and therapist confirms his identity by obtaining two verifiers.  Therapist inquires if he forgot again about his appointment as he did last week. He reported he has an appointment with another  MD around 2 and could not join for his virtual appointment today. Therapist asks if [name] is ok and he says he is and denies S/HI.  Therapist reminds him of his virtual appointment on next Thursday at 2pm.  Darice Simpler, MS, LMFt, LCAS

## 2024-04-02 NOTE — Progress Notes (Signed)
 Still with tenderness at the heel right but really getting more improvement with the second injection.  Maybe for few days but pain returned at the same level going to pain along with bilateral foot and foot foot aspect of the foot indicating over the base of the fifth tarsal at the insertion of the peroneus brevis tendon.   Physical exam:  General appearance: Pleasant, and in no acute distress. AOx3.  Vascular: Pedal pulses: DP 2/4 bilaterally, PT 2/4 bilaterally.  Minimal edema lower legs bilaterally. Capillary fill time immediate bilaterally  Neurological: Light touch intact feet bilaterally.  Normal Achilles reflex bilaterally.  No clonus or spasticity noted.  Negative Tinel sign tarsal tunnel and porta pedis bilaterally.  Dermatologic:   Skin normal temperature bilaterally.  Skin normal color, tone, and texture bilaterally.   Musculoskeletal: Tenderness plantar medial aspect calcaneus at medial plantar calcaneal tubercle right muscle strength normal lower extremity bilaterally.  No tenderness with lateral compression of calcaneus.   Diagnosis: 1.  Plantar fasciitis right   Plan: - Will try third injection at the plantar fascia right and also try some oral prednisone .  Continue wearing stable shoes.. - Injected 2cc mixture of 1cc 0.5% Marcaine , 1cc triamcinolone  40 mg / 1 mL at plantar fascia origin medial plantar calcaneal tubercle right.     Return 2 weeks follow-up injection plantar fascia right

## 2024-04-02 NOTE — Progress Notes (Deleted)
 SABRA

## 2024-04-09 ENCOUNTER — Ambulatory Visit (HOSPITAL_COMMUNITY): Payer: MEDICAID

## 2024-04-16 ENCOUNTER — Ambulatory Visit (HOSPITAL_COMMUNITY): Payer: MEDICAID

## 2024-04-16 ENCOUNTER — Ambulatory Visit: Payer: MEDICAID | Admitting: Podiatry

## 2024-04-23 ENCOUNTER — Ambulatory Visit (HOSPITAL_COMMUNITY): Payer: MEDICAID

## 2024-04-30 ENCOUNTER — Ambulatory Visit (HOSPITAL_COMMUNITY): Payer: MEDICAID

## 2024-05-01 ENCOUNTER — Ambulatory Visit: Payer: Self-pay

## 2024-05-07 ENCOUNTER — Ambulatory Visit (HOSPITAL_COMMUNITY): Payer: MEDICAID

## 2024-05-14 ENCOUNTER — Ambulatory Visit (HOSPITAL_COMMUNITY): Payer: MEDICAID

## 2024-05-21 ENCOUNTER — Ambulatory Visit (HOSPITAL_COMMUNITY): Payer: MEDICAID

## 2024-05-28 ENCOUNTER — Ambulatory Visit (HOSPITAL_COMMUNITY): Payer: MEDICAID

## 2024-06-29 ENCOUNTER — Ambulatory Visit: Payer: MEDICAID | Admitting: Internal Medicine

## 2024-07-27 ENCOUNTER — Ambulatory Visit: Payer: MEDICAID | Admitting: Diagnostic Neuroimaging
